# Patient Record
Sex: Female | Born: 1955 | Race: White | Hispanic: No | Smoking: Never smoker
Health system: Southern US, Community
[De-identification: ages and names within clinical notes are randomized; demographics above are authoritative.]

## PROBLEM LIST (undated history)

## (undated) DIAGNOSIS — E7800 Pure hypercholesterolemia, unspecified: Secondary | ICD-10-CM

## (undated) DIAGNOSIS — G629 Polyneuropathy, unspecified: Secondary | ICD-10-CM

## (undated) DIAGNOSIS — M1990 Unspecified osteoarthritis, unspecified site: Secondary | ICD-10-CM

## (undated) DIAGNOSIS — J45909 Unspecified asthma, uncomplicated: Secondary | ICD-10-CM

## (undated) DIAGNOSIS — J309 Allergic rhinitis, unspecified: Secondary | ICD-10-CM

## (undated) HISTORY — PX: NASAL SINUS SURGERY: SHX719

## (undated) HISTORY — DX: Pure hypercholesterolemia, unspecified: E78.00

## (undated) HISTORY — DX: Allergic rhinitis, unspecified: J30.9

## (undated) HISTORY — DX: Unspecified osteoarthritis, unspecified site: M19.90

## (undated) HISTORY — DX: Unspecified asthma, uncomplicated: J45.909

## (undated) HISTORY — DX: Polyneuropathy, unspecified: G62.9

## (undated) HISTORY — PX: OVARIAN CYST REMOVAL: SHX89

---

## 1999-01-27 ENCOUNTER — Other Ambulatory Visit: Admission: RE | Admit: 1999-01-27 | Discharge: 1999-01-27 | Payer: Self-pay | Admitting: Obstetrics and Gynecology

## 2000-01-28 ENCOUNTER — Other Ambulatory Visit: Admission: RE | Admit: 2000-01-28 | Discharge: 2000-01-28 | Payer: Self-pay | Admitting: Obstetrics and Gynecology

## 2000-01-28 ENCOUNTER — Encounter (INDEPENDENT_AMBULATORY_CARE_PROVIDER_SITE_OTHER): Payer: Self-pay

## 2000-06-02 ENCOUNTER — Other Ambulatory Visit: Admission: RE | Admit: 2000-06-02 | Discharge: 2000-06-02 | Payer: Self-pay | Admitting: Obstetrics and Gynecology

## 2001-06-12 ENCOUNTER — Other Ambulatory Visit: Admission: RE | Admit: 2001-06-12 | Discharge: 2001-06-12 | Payer: Self-pay | Admitting: Obstetrics and Gynecology

## 2001-10-12 ENCOUNTER — Encounter: Admission: RE | Admit: 2001-10-12 | Discharge: 2001-10-12 | Payer: Self-pay | Admitting: Obstetrics and Gynecology

## 2001-10-12 ENCOUNTER — Encounter: Payer: Self-pay | Admitting: Obstetrics and Gynecology

## 2002-06-14 ENCOUNTER — Other Ambulatory Visit: Admission: RE | Admit: 2002-06-14 | Discharge: 2002-06-14 | Payer: Self-pay | Admitting: Obstetrics and Gynecology

## 2002-11-01 ENCOUNTER — Encounter: Admission: RE | Admit: 2002-11-01 | Discharge: 2002-11-01 | Payer: Self-pay | Admitting: Obstetrics and Gynecology

## 2002-11-01 ENCOUNTER — Encounter: Payer: Self-pay | Admitting: Obstetrics and Gynecology

## 2003-06-25 ENCOUNTER — Other Ambulatory Visit: Admission: RE | Admit: 2003-06-25 | Discharge: 2003-06-25 | Payer: Self-pay | Admitting: Obstetrics and Gynecology

## 2003-11-14 ENCOUNTER — Encounter: Admission: RE | Admit: 2003-11-14 | Discharge: 2003-11-14 | Payer: Self-pay | Admitting: Obstetrics and Gynecology

## 2004-06-30 ENCOUNTER — Other Ambulatory Visit: Admission: RE | Admit: 2004-06-30 | Discharge: 2004-06-30 | Payer: Self-pay | Admitting: Obstetrics and Gynecology

## 2004-07-13 ENCOUNTER — Ambulatory Visit (HOSPITAL_COMMUNITY): Admission: RE | Admit: 2004-07-13 | Discharge: 2004-07-13 | Payer: Self-pay | Admitting: Gastroenterology

## 2004-10-19 ENCOUNTER — Ambulatory Visit: Payer: Self-pay | Admitting: Internal Medicine

## 2004-12-02 ENCOUNTER — Encounter: Admission: RE | Admit: 2004-12-02 | Discharge: 2004-12-02 | Payer: Self-pay | Admitting: Obstetrics and Gynecology

## 2005-07-26 ENCOUNTER — Other Ambulatory Visit: Admission: RE | Admit: 2005-07-26 | Discharge: 2005-07-26 | Payer: Self-pay | Admitting: Obstetrics and Gynecology

## 2005-12-09 ENCOUNTER — Encounter: Admission: RE | Admit: 2005-12-09 | Discharge: 2005-12-09 | Payer: Self-pay | Admitting: Obstetrics and Gynecology

## 2006-07-27 ENCOUNTER — Other Ambulatory Visit: Admission: RE | Admit: 2006-07-27 | Discharge: 2006-07-27 | Payer: Self-pay | Admitting: Obstetrics and Gynecology

## 2006-12-12 ENCOUNTER — Encounter: Admission: RE | Admit: 2006-12-12 | Discharge: 2006-12-12 | Payer: Self-pay | Admitting: Obstetrics and Gynecology

## 2007-07-31 ENCOUNTER — Other Ambulatory Visit: Admission: RE | Admit: 2007-07-31 | Discharge: 2007-07-31 | Payer: Self-pay | Admitting: Obstetrics and Gynecology

## 2007-12-20 ENCOUNTER — Other Ambulatory Visit: Payer: Self-pay

## 2007-12-20 ENCOUNTER — Observation Stay: Payer: Self-pay | Admitting: Internal Medicine

## 2007-12-28 ENCOUNTER — Encounter: Admission: RE | Admit: 2007-12-28 | Discharge: 2007-12-28 | Payer: Self-pay | Admitting: Obstetrics and Gynecology

## 2008-08-06 ENCOUNTER — Other Ambulatory Visit: Admission: RE | Admit: 2008-08-06 | Discharge: 2008-08-06 | Payer: Self-pay | Admitting: Obstetrics and Gynecology

## 2008-10-09 ENCOUNTER — Ambulatory Visit: Payer: Self-pay | Admitting: Internal Medicine

## 2008-10-29 ENCOUNTER — Ambulatory Visit: Payer: Self-pay | Admitting: Internal Medicine

## 2009-02-06 ENCOUNTER — Encounter: Admission: RE | Admit: 2009-02-06 | Discharge: 2009-02-06 | Payer: Self-pay | Admitting: Obstetrics and Gynecology

## 2009-08-08 ENCOUNTER — Other Ambulatory Visit: Admission: RE | Admit: 2009-08-08 | Discharge: 2009-08-08 | Payer: Self-pay | Admitting: Obstetrics and Gynecology

## 2010-06-11 ENCOUNTER — Encounter: Admission: RE | Admit: 2010-06-11 | Discharge: 2010-06-11 | Payer: Self-pay | Admitting: Obstetrics and Gynecology

## 2011-02-12 NOTE — Op Note (Signed)
NAME:  Kristin Sparks, Kristin Sparks NO.:  [id number]   MEDICAL RECORD NO.:  [id number]          PATIENT TYPE:  AMB   LOCATION:  ENDO                         FACILITY:  [facility]   PHYSICIAN:  Petra Kuba, M.D.    DATE OF BIRTH:  06-18-1956   DATE OF PROCEDURE:  07/13/2004  DATE OF DISCHARGE:                                 OPERATIVE REPORT   PROCEDURE:  Colonoscopy.   ENDOSCOPIST:  Petra Kuba, M.D.   INDICATION:  Family history of colon cancer, due to colonic screening.   INFORMED CONSENT:  Consent was signed after risks, benefits, methods and  options were thoroughly discussed in the office in the past.   MEDICATIONS USED:  Demerol 75 mg, Versed 8 mg.   PROCEDURE:  Rectal inspection was pertinent for external hemorrhoids, small.  Digital exam was negative.  Video pediatric adjustable colonoscope was  inserted and with abdominal pressure, able to be advanced around the colon  to the cecum.  No position changes were needed.  No abnormality was seen on  insertion.  The cecum was identified by the appendiceal orifice and the  ileocecal valve.  Scope was slowly withdrawn.  The prep was adequate.  There  was some liquid stool that required washing and suctioning.  On slow  withdrawal through the colon, no polyps, tumors, masses or diverticula were  seen as we slowly withdrew back to the rectum.  Anorectal pull-through in  retroflexion confirmed some small hemorrhoids.  Scope was straightened and  re-advanced a short ways up the left side of the colon, air was suctioned  and scope removed.  The patient tolerated the procedure well.  There were no  obvious immediate complications.   ENDOSCOPIC DIAGNOSES:  1.  Internal and external small hemorrhoids.  2.  Otherwise within normal limits to the cecum.   PLAN:  Yearly rectals and guaiacs per either Dr. Artist Pais or Dr.  Ailene Ards at the [facility].  Happy to see back p.r.n.,  otherwise, repeat screening in 5  years.       MEM/MEDQ  D:  07/13/2004  T:  07/13/2004  Job:  [id number]   cc:   Artist Pais, M.D.  [address], [address]  [place]  [place] [address]  Fax: [phone]   Ailene Ards  [address]  [place]  [place] [address]  Fax: [phone]

## 2011-06-23 ENCOUNTER — Other Ambulatory Visit: Payer: Self-pay | Admitting: Gynecology

## 2011-06-23 DIAGNOSIS — Z1231 Encounter for screening mammogram for malignant neoplasm of breast: Secondary | ICD-10-CM

## 2011-07-05 ENCOUNTER — Ambulatory Visit
Admission: RE | Admit: 2011-07-05 | Discharge: 2011-07-05 | Disposition: A | Discharge status: Home / Self Care | Payer: 59 | Source: Ambulatory Visit | Attending: Gynecology | Admitting: Gynecology

## 2011-07-05 DIAGNOSIS — Z1231 Encounter for screening mammogram for malignant neoplasm of breast: Secondary | ICD-10-CM

## 2012-06-05 ENCOUNTER — Other Ambulatory Visit: Payer: Self-pay | Admitting: Gynecology

## 2012-06-05 DIAGNOSIS — Z1231 Encounter for screening mammogram for malignant neoplasm of breast: Secondary | ICD-10-CM

## 2012-08-04 ENCOUNTER — Ambulatory Visit
Admission: RE | Admit: 2012-08-04 | Discharge: 2012-08-04 | Disposition: A | Discharge status: Home / Self Care | Payer: 59 | Source: Ambulatory Visit | Attending: Gynecology | Admitting: Gynecology

## 2012-08-04 DIAGNOSIS — Z1231 Encounter for screening mammogram for malignant neoplasm of breast: Secondary | ICD-10-CM

## 2012-08-09 ENCOUNTER — Other Ambulatory Visit: Payer: Self-pay | Admitting: Gynecology

## 2012-08-09 DIAGNOSIS — R928 Other abnormal and inconclusive findings on diagnostic imaging of breast: Secondary | ICD-10-CM

## 2012-08-16 ENCOUNTER — Ambulatory Visit
Admission: RE | Admit: 2012-08-16 | Discharge: 2012-08-16 | Disposition: A | Discharge status: Home / Self Care | Payer: 59 | Source: Ambulatory Visit | Attending: Gynecology | Admitting: Gynecology

## 2012-08-16 DIAGNOSIS — R928 Other abnormal and inconclusive findings on diagnostic imaging of breast: Secondary | ICD-10-CM

## 2013-03-22 ENCOUNTER — Other Ambulatory Visit: Payer: Self-pay

## 2013-03-22 MED ORDER — PREGABALIN 75 MG PO CAPS: 75.0000 mg | ORAL_CAPSULE | Freq: Every evening | ORAL | Status: AC

## 2013-03-22 NOTE — Telephone Encounter (Signed)
Former [facility] patient, has not been assigned new provider.  Forwarding refill request to Dr Vickey Huger, Raquel Sarna

## 2013-07-25 ENCOUNTER — Other Ambulatory Visit: Payer: Self-pay

## 2013-07-25 DIAGNOSIS — Z1231 Encounter for screening mammogram for malignant neoplasm of breast: Secondary | ICD-10-CM

## 2013-10-01 ENCOUNTER — Ambulatory Visit
Admission: RE | Admit: 2013-10-01 | Discharge: 2013-10-01 | Disposition: A | Discharge status: Home / Self Care | Payer: 59 | Source: Ambulatory Visit

## 2013-10-01 DIAGNOSIS — Z1231 Encounter for screening mammogram for malignant neoplasm of breast: Secondary | ICD-10-CM

## 2014-08-19 DIAGNOSIS — J45909 Unspecified asthma, uncomplicated: Secondary | ICD-10-CM | POA: Insufficient documentation

## 2015-06-10 DIAGNOSIS — G608 Other hereditary and idiopathic neuropathies: Secondary | ICD-10-CM | POA: Insufficient documentation

## 2015-11-18 ENCOUNTER — Other Ambulatory Visit: Payer: Self-pay | Admitting: Obstetrics and Gynecology

## 2015-11-18 DIAGNOSIS — R928 Other abnormal and inconclusive findings on diagnostic imaging of breast: Secondary | ICD-10-CM

## 2015-11-25 ENCOUNTER — Ambulatory Visit
Admission: RE | Admit: 2015-11-25 | Discharge: 2015-11-25 | Disposition: A | Discharge status: Home / Self Care | Payer: 59 | Source: Ambulatory Visit | Attending: Obstetrics and Gynecology | Admitting: Obstetrics and Gynecology

## 2015-11-25 DIAGNOSIS — R928 Other abnormal and inconclusive findings on diagnostic imaging of breast: Secondary | ICD-10-CM

## 2015-12-09 DIAGNOSIS — J301 Allergic rhinitis due to pollen: Secondary | ICD-10-CM | POA: Insufficient documentation

## 2015-12-09 DIAGNOSIS — E7800 Pure hypercholesterolemia, unspecified: Secondary | ICD-10-CM | POA: Insufficient documentation

## 2016-07-26 ENCOUNTER — Ambulatory Visit (INDEPENDENT_AMBULATORY_CARE_PROVIDER_SITE_OTHER): Payer: 59 | Admitting: Psychology

## 2016-07-26 DIAGNOSIS — F4323 Adjustment disorder with mixed anxiety and depressed mood: Secondary | ICD-10-CM

## 2016-08-09 ENCOUNTER — Ambulatory Visit (INDEPENDENT_AMBULATORY_CARE_PROVIDER_SITE_OTHER): Payer: 59 | Admitting: Psychology

## 2016-08-09 DIAGNOSIS — F4323 Adjustment disorder with mixed anxiety and depressed mood: Secondary | ICD-10-CM | POA: Diagnosis not present

## 2016-08-26 ENCOUNTER — Ambulatory Visit (INDEPENDENT_AMBULATORY_CARE_PROVIDER_SITE_OTHER): Payer: 59 | Admitting: Psychology

## 2016-08-26 DIAGNOSIS — F4323 Adjustment disorder with mixed anxiety and depressed mood: Secondary | ICD-10-CM

## 2016-09-09 ENCOUNTER — Ambulatory Visit (INDEPENDENT_AMBULATORY_CARE_PROVIDER_SITE_OTHER): Payer: 59 | Admitting: Psychology

## 2016-09-09 DIAGNOSIS — F4323 Adjustment disorder with mixed anxiety and depressed mood: Secondary | ICD-10-CM | POA: Diagnosis not present

## 2016-10-06 DIAGNOSIS — M25562 Pain in left knee: Secondary | ICD-10-CM | POA: Diagnosis not present

## 2016-10-08 DIAGNOSIS — M25562 Pain in left knee: Secondary | ICD-10-CM | POA: Diagnosis not present

## 2016-10-12 DIAGNOSIS — M25562 Pain in left knee: Secondary | ICD-10-CM | POA: Diagnosis not present

## 2016-10-13 ENCOUNTER — Ambulatory Visit: Payer: 59 | Admitting: Psychology

## 2016-10-18 DIAGNOSIS — M25562 Pain in left knee: Secondary | ICD-10-CM | POA: Diagnosis not present

## 2016-10-21 DIAGNOSIS — M25562 Pain in left knee: Secondary | ICD-10-CM | POA: Diagnosis not present

## 2016-10-25 DIAGNOSIS — M25562 Pain in left knee: Secondary | ICD-10-CM | POA: Diagnosis not present

## 2016-10-28 ENCOUNTER — Ambulatory Visit (INDEPENDENT_AMBULATORY_CARE_PROVIDER_SITE_OTHER): Payer: 59 | Admitting: Psychology

## 2016-10-28 DIAGNOSIS — M25562 Pain in left knee: Secondary | ICD-10-CM | POA: Diagnosis not present

## 2016-10-28 DIAGNOSIS — F4323 Adjustment disorder with mixed anxiety and depressed mood: Secondary | ICD-10-CM

## 2016-11-02 DIAGNOSIS — M25562 Pain in left knee: Secondary | ICD-10-CM | POA: Diagnosis not present

## 2016-11-05 DIAGNOSIS — M1712 Unilateral primary osteoarthritis, left knee: Secondary | ICD-10-CM | POA: Diagnosis not present

## 2016-11-05 DIAGNOSIS — M25562 Pain in left knee: Secondary | ICD-10-CM | POA: Diagnosis not present

## 2016-11-12 DIAGNOSIS — M25562 Pain in left knee: Secondary | ICD-10-CM | POA: Diagnosis not present

## 2016-11-15 DIAGNOSIS — Z1231 Encounter for screening mammogram for malignant neoplasm of breast: Secondary | ICD-10-CM | POA: Diagnosis not present

## 2016-11-19 DIAGNOSIS — M25562 Pain in left knee: Secondary | ICD-10-CM | POA: Diagnosis not present

## 2016-11-23 DIAGNOSIS — Z01419 Encounter for gynecological examination (general) (routine) without abnormal findings: Secondary | ICD-10-CM | POA: Diagnosis not present

## 2016-11-23 DIAGNOSIS — Z6824 Body mass index (BMI) 24.0-24.9, adult: Secondary | ICD-10-CM | POA: Diagnosis not present

## 2016-11-26 DIAGNOSIS — M25562 Pain in left knee: Secondary | ICD-10-CM | POA: Diagnosis not present

## 2016-12-03 DIAGNOSIS — M25562 Pain in left knee: Secondary | ICD-10-CM | POA: Diagnosis not present

## 2016-12-10 DIAGNOSIS — M25562 Pain in left knee: Secondary | ICD-10-CM | POA: Diagnosis not present

## 2016-12-16 ENCOUNTER — Ambulatory Visit (INDEPENDENT_AMBULATORY_CARE_PROVIDER_SITE_OTHER): Payer: 59 | Admitting: Psychology

## 2016-12-16 DIAGNOSIS — F4323 Adjustment disorder with mixed anxiety and depressed mood: Secondary | ICD-10-CM | POA: Diagnosis not present

## 2016-12-20 DIAGNOSIS — M25562 Pain in left knee: Secondary | ICD-10-CM | POA: Diagnosis not present

## 2016-12-28 ENCOUNTER — Ambulatory Visit (INDEPENDENT_AMBULATORY_CARE_PROVIDER_SITE_OTHER): Payer: 59 | Admitting: Psychology

## 2016-12-28 DIAGNOSIS — F4323 Adjustment disorder with mixed anxiety and depressed mood: Secondary | ICD-10-CM | POA: Diagnosis not present

## 2017-01-03 DIAGNOSIS — H401131 Primary open-angle glaucoma, bilateral, mild stage: Secondary | ICD-10-CM | POA: Diagnosis not present

## 2017-01-10 ENCOUNTER — Ambulatory Visit (INDEPENDENT_AMBULATORY_CARE_PROVIDER_SITE_OTHER): Payer: 59 | Admitting: Psychology

## 2017-01-10 DIAGNOSIS — F4323 Adjustment disorder with mixed anxiety and depressed mood: Secondary | ICD-10-CM

## 2017-01-27 ENCOUNTER — Ambulatory Visit (INDEPENDENT_AMBULATORY_CARE_PROVIDER_SITE_OTHER): Payer: 59 | Admitting: Psychology

## 2017-01-27 DIAGNOSIS — F4323 Adjustment disorder with mixed anxiety and depressed mood: Secondary | ICD-10-CM

## 2017-02-02 DIAGNOSIS — J4520 Mild intermittent asthma, uncomplicated: Secondary | ICD-10-CM | POA: Diagnosis not present

## 2017-02-02 DIAGNOSIS — J3089 Other allergic rhinitis: Secondary | ICD-10-CM | POA: Diagnosis not present

## 2017-02-10 DIAGNOSIS — J4520 Mild intermittent asthma, uncomplicated: Secondary | ICD-10-CM | POA: Diagnosis not present

## 2017-02-15 ENCOUNTER — Ambulatory Visit (INDEPENDENT_AMBULATORY_CARE_PROVIDER_SITE_OTHER): Payer: 59 | Admitting: Psychology

## 2017-02-15 DIAGNOSIS — F4323 Adjustment disorder with mixed anxiety and depressed mood: Secondary | ICD-10-CM

## 2017-03-01 ENCOUNTER — Ambulatory Visit (INDEPENDENT_AMBULATORY_CARE_PROVIDER_SITE_OTHER): Payer: 59 | Admitting: Psychology

## 2017-03-01 DIAGNOSIS — F321 Major depressive disorder, single episode, moderate: Secondary | ICD-10-CM | POA: Diagnosis not present

## 2017-03-15 ENCOUNTER — Ambulatory Visit (INDEPENDENT_AMBULATORY_CARE_PROVIDER_SITE_OTHER): Payer: 59 | Admitting: Psychology

## 2017-03-15 DIAGNOSIS — F4323 Adjustment disorder with mixed anxiety and depressed mood: Secondary | ICD-10-CM

## 2017-03-21 ENCOUNTER — Ambulatory Visit (INDEPENDENT_AMBULATORY_CARE_PROVIDER_SITE_OTHER): Payer: 59 | Admitting: Psychology

## 2017-03-21 DIAGNOSIS — F4323 Adjustment disorder with mixed anxiety and depressed mood: Secondary | ICD-10-CM

## 2017-05-04 ENCOUNTER — Ambulatory Visit (INDEPENDENT_AMBULATORY_CARE_PROVIDER_SITE_OTHER): Payer: 59 | Admitting: Psychology

## 2017-05-04 DIAGNOSIS — F4323 Adjustment disorder with mixed anxiety and depressed mood: Secondary | ICD-10-CM | POA: Diagnosis not present

## 2017-05-16 ENCOUNTER — Ambulatory Visit (INDEPENDENT_AMBULATORY_CARE_PROVIDER_SITE_OTHER): Payer: 59 | Admitting: Psychology

## 2017-05-16 DIAGNOSIS — F4323 Adjustment disorder with mixed anxiety and depressed mood: Secondary | ICD-10-CM | POA: Diagnosis not present

## 2017-05-26 ENCOUNTER — Ambulatory Visit (INDEPENDENT_AMBULATORY_CARE_PROVIDER_SITE_OTHER): Payer: 59 | Admitting: Psychology

## 2017-05-26 DIAGNOSIS — F4323 Adjustment disorder with mixed anxiety and depressed mood: Secondary | ICD-10-CM

## 2017-06-03 ENCOUNTER — Ambulatory Visit: Payer: 59 | Admitting: Family Medicine

## 2017-06-06 ENCOUNTER — Ambulatory Visit (INDEPENDENT_AMBULATORY_CARE_PROVIDER_SITE_OTHER): Payer: 59 | Admitting: Psychology

## 2017-06-06 DIAGNOSIS — F4323 Adjustment disorder with mixed anxiety and depressed mood: Secondary | ICD-10-CM

## 2017-06-07 ENCOUNTER — Ambulatory Visit (INDEPENDENT_AMBULATORY_CARE_PROVIDER_SITE_OTHER): Payer: 59 | Admitting: Family Medicine

## 2017-06-07 ENCOUNTER — Encounter: Payer: Self-pay | Admitting: Family Medicine

## 2017-06-07 VITALS — BP 122/60 | Ht 64.0 in | Wt 140.0 lb

## 2017-06-07 DIAGNOSIS — M25562 Pain in left knee: Secondary | ICD-10-CM | POA: Diagnosis not present

## 2017-06-07 MED ORDER — METHYLPREDNISOLONE ACETATE 40 MG/ML IJ SUSP
40.0000 mg | Freq: Once | INTRAMUSCULAR | Status: AC
  Administered 2017-06-07: 40 mg via INTRA_ARTICULAR

## 2017-06-07 NOTE — Patient Instructions (Signed)
Today you received an injection with corticosteroid. This injection is usually done in response to pain and inflammation. There is some "numbing" medicine also in the shot so the injected area may be numb and feel really good for the next couple of hours. The numbing medicine usually wears off in 2-3 hours though, and then your pain level will be right back where it was before the injection.   The actually benefit from the steroid injection is usually noticed in 2-7 days. You may actually experience a small (as in 10%) INCREASE in pain in the first 24 hours---that is common.   Things to watch out for that you should contact us or a health care provider urgently would include: 1. Unusual (as in more than 10%) increase in pain 2. New fever > 101.5 3. New swelling or redness of the injected area.  4. Streaking of red lines around the area injected.  

## 2017-06-08 ENCOUNTER — Encounter: Payer: Self-pay | Admitting: Family Medicine

## 2017-06-08 DIAGNOSIS — M25562 Pain in left knee: Secondary | ICD-10-CM | POA: Insufficient documentation

## 2017-06-08 DIAGNOSIS — L719 Rosacea, unspecified: Secondary | ICD-10-CM | POA: Insufficient documentation

## 2017-06-08 NOTE — Progress Notes (Signed)
    CHIEF COMPLAINT / HPI:  Left knee pain for several months. Avid runner, 3 times a week---about 10 miles per week. Likes to run [employer] and [employer] races. Started having problems about 6 months ago, initially only after running. Progressed and was with running and after. Stopped running for 3 months and then re-tried it and pain was still there. During off time she did have some pain with activity but none with rest. Pain worse with twisting motions.  PERTINENT  PMH / PSH: I have reviewed the patient's medications, allergies, past medical and surgical history, smoking status.  Pertinent findings that relate to today's visit / issues include: Asthma  No peronsal hx of DM Never smoker FH negative for issues pertinent to chief complaint  REVIEW OF SYSTEMS:  CONSTITUTIONAL: no unusual weight change, no fever. No unusual fatigue. SKIN: no rash HEME: no unusual bleeding or bruising MSK: no other unusual arthralgias or myalgias NEURO: baseline neuropathy that is unchanged recently PSYCHIATRIC: no unusual mood change or depressive symptoms.  OBJECTIVE:  Vital signs are reviewed.  GEN WD WN NAD KNEES: symmetrical but slight effusion on left. FROM bilaterally. Ligamentously intact bilaterally with normal Lachman and good dsable endpoint.. Very mild crepitus Bilaterally but L>R. Nonpainful.  Patella with normal tracking. LEFT knee ttp over a specific dime sized area anterior medial. McMurray causes pain in same area but no pop. PosiitiveThesally. Painful patellar grind.. calves are non tender B and muscle bulk is symmetrical. Quad development is good. GAIT: running: short stride length which leads to a lot of "pounding" during stance phase. Forefoot striiker. upright body posture. Symmetrical. Subtalar motion seems normal. NEURO: grossly intact sensation to soft touch bilateral LE including foot and ankle with intact proprioception. VASC: DP pulses 2+B= PSYCH AXOX4. Normal affect.  ASSESSMENT /  PLAN: Please see problem oriented charting for details

## 2017-06-08 NOTE — Assessment & Plan Note (Signed)
We discussed options at length. MRI would give more definitive diagnosis but initial tratament would likely be the same. She has essentially already tried adequate conservative approach althouhgh she did not use 2 weeks of NSAID: I doubt it would be beneficial at this point. We decided to try CSI and see how she does. If no improvement, then I would re-evaluate with consideration of MRI.  INJECTION: Patient was given informed consent, signed copy in the chart. Appropriate time out was taken. Area prepped and draped in usual sterile fashion. 1 cc of methylprednisolone 40 mg/ml plus  4 cc of 1% lidocaine without epinephrine was injected into the LEFT KNEE JOINT using a(n)  Anterior medial approach. Needle 21 gauge 11/2 inch. Ethyl chloride used for anesthesia as well.The patient tolerated the procedure well. There were no complications. Post procedure instructions were given. See AVS for further instructions.

## 2017-06-23 DIAGNOSIS — H401131 Primary open-angle glaucoma, bilateral, mild stage: Secondary | ICD-10-CM | POA: Diagnosis not present

## 2017-07-07 ENCOUNTER — Ambulatory Visit (INDEPENDENT_AMBULATORY_CARE_PROVIDER_SITE_OTHER): Payer: 59 | Admitting: Psychology

## 2017-07-07 DIAGNOSIS — F321 Major depressive disorder, single episode, moderate: Secondary | ICD-10-CM

## 2017-07-07 DIAGNOSIS — F4323 Adjustment disorder with mixed anxiety and depressed mood: Secondary | ICD-10-CM

## 2017-07-21 ENCOUNTER — Ambulatory Visit (INDEPENDENT_AMBULATORY_CARE_PROVIDER_SITE_OTHER): Payer: 59 | Admitting: Psychology

## 2017-07-21 DIAGNOSIS — F321 Major depressive disorder, single episode, moderate: Secondary | ICD-10-CM

## 2017-07-21 DIAGNOSIS — F4323 Adjustment disorder with mixed anxiety and depressed mood: Secondary | ICD-10-CM | POA: Diagnosis not present

## 2017-07-25 DIAGNOSIS — Z23 Encounter for immunization: Secondary | ICD-10-CM | POA: Diagnosis not present

## 2017-08-04 ENCOUNTER — Ambulatory Visit: Payer: 59 | Admitting: Psychology

## 2017-08-04 DIAGNOSIS — F321 Major depressive disorder, single episode, moderate: Secondary | ICD-10-CM

## 2017-08-04 DIAGNOSIS — F4323 Adjustment disorder with mixed anxiety and depressed mood: Secondary | ICD-10-CM | POA: Diagnosis not present

## 2017-08-09 DIAGNOSIS — H10522 Angular blepharoconjunctivitis, left eye: Secondary | ICD-10-CM | POA: Diagnosis not present

## 2017-08-16 ENCOUNTER — Ambulatory Visit: Payer: 59 | Admitting: Psychology

## 2017-08-16 DIAGNOSIS — F321 Major depressive disorder, single episode, moderate: Secondary | ICD-10-CM

## 2017-08-16 DIAGNOSIS — F4323 Adjustment disorder with mixed anxiety and depressed mood: Secondary | ICD-10-CM

## 2017-08-17 DIAGNOSIS — D485 Neoplasm of uncertain behavior of skin: Secondary | ICD-10-CM | POA: Diagnosis not present

## 2017-08-17 DIAGNOSIS — D0439 Carcinoma in situ of skin of other parts of face: Secondary | ICD-10-CM | POA: Diagnosis not present

## 2017-08-17 DIAGNOSIS — D225 Melanocytic nevi of trunk: Secondary | ICD-10-CM | POA: Diagnosis not present

## 2017-09-06 ENCOUNTER — Ambulatory Visit: Payer: 59 | Admitting: Psychology

## 2017-09-22 ENCOUNTER — Ambulatory Visit: Payer: 59 | Admitting: Psychology

## 2017-10-06 ENCOUNTER — Ambulatory Visit: Payer: 59 | Admitting: Psychology

## 2017-10-06 DIAGNOSIS — F321 Major depressive disorder, single episode, moderate: Secondary | ICD-10-CM

## 2017-10-06 DIAGNOSIS — F4323 Adjustment disorder with mixed anxiety and depressed mood: Secondary | ICD-10-CM

## 2017-10-17 DIAGNOSIS — J4520 Mild intermittent asthma, uncomplicated: Secondary | ICD-10-CM | POA: Diagnosis not present

## 2017-10-19 ENCOUNTER — Ambulatory Visit: Payer: 59 | Admitting: Psychology

## 2017-10-24 DIAGNOSIS — J4520 Mild intermittent asthma, uncomplicated: Secondary | ICD-10-CM | POA: Diagnosis not present

## 2017-10-24 DIAGNOSIS — Z0000 Encounter for general adult medical examination without abnormal findings: Secondary | ICD-10-CM | POA: Diagnosis not present

## 2017-11-01 ENCOUNTER — Ambulatory Visit: Payer: 59 | Admitting: Psychology

## 2017-11-01 DIAGNOSIS — F321 Major depressive disorder, single episode, moderate: Secondary | ICD-10-CM | POA: Diagnosis not present

## 2017-11-01 DIAGNOSIS — F4323 Adjustment disorder with mixed anxiety and depressed mood: Secondary | ICD-10-CM

## 2017-11-15 ENCOUNTER — Ambulatory Visit: Payer: 59 | Admitting: Psychology

## 2017-11-15 DIAGNOSIS — F4323 Adjustment disorder with mixed anxiety and depressed mood: Secondary | ICD-10-CM | POA: Diagnosis not present

## 2017-11-15 DIAGNOSIS — F321 Major depressive disorder, single episode, moderate: Secondary | ICD-10-CM

## 2017-11-23 DIAGNOSIS — H401131 Primary open-angle glaucoma, bilateral, mild stage: Secondary | ICD-10-CM | POA: Diagnosis not present

## 2017-11-29 ENCOUNTER — Ambulatory Visit: Payer: 59 | Admitting: Psychology

## 2017-11-29 DIAGNOSIS — F321 Major depressive disorder, single episode, moderate: Secondary | ICD-10-CM | POA: Diagnosis not present

## 2017-11-29 DIAGNOSIS — F4323 Adjustment disorder with mixed anxiety and depressed mood: Secondary | ICD-10-CM | POA: Diagnosis not present

## 2017-12-13 ENCOUNTER — Ambulatory Visit: Payer: 59 | Admitting: Psychology

## 2017-12-13 DIAGNOSIS — Z6824 Body mass index (BMI) 24.0-24.9, adult: Secondary | ICD-10-CM | POA: Diagnosis not present

## 2017-12-13 DIAGNOSIS — Z01419 Encounter for gynecological examination (general) (routine) without abnormal findings: Secondary | ICD-10-CM | POA: Diagnosis not present

## 2017-12-20 DIAGNOSIS — Z85828 Personal history of other malignant neoplasm of skin: Secondary | ICD-10-CM | POA: Diagnosis not present

## 2017-12-27 ENCOUNTER — Ambulatory Visit: Payer: 59 | Admitting: Psychology

## 2017-12-27 DIAGNOSIS — F321 Major depressive disorder, single episode, moderate: Secondary | ICD-10-CM

## 2017-12-27 DIAGNOSIS — F4323 Adjustment disorder with mixed anxiety and depressed mood: Secondary | ICD-10-CM

## 2018-01-10 ENCOUNTER — Ambulatory Visit: Payer: 59 | Admitting: Psychology

## 2018-01-24 ENCOUNTER — Ambulatory Visit: Payer: 59 | Admitting: Psychology

## 2018-01-24 DIAGNOSIS — F4323 Adjustment disorder with mixed anxiety and depressed mood: Secondary | ICD-10-CM

## 2018-01-24 DIAGNOSIS — F321 Major depressive disorder, single episode, moderate: Secondary | ICD-10-CM | POA: Diagnosis not present

## 2018-02-07 ENCOUNTER — Ambulatory Visit: Payer: 59 | Admitting: Psychology

## 2018-02-15 DIAGNOSIS — H40013 Open angle with borderline findings, low risk, bilateral: Secondary | ICD-10-CM | POA: Diagnosis not present

## 2018-02-21 ENCOUNTER — Ambulatory Visit: Payer: 59 | Admitting: Psychology

## 2018-02-21 DIAGNOSIS — F4323 Adjustment disorder with mixed anxiety and depressed mood: Secondary | ICD-10-CM

## 2018-02-21 DIAGNOSIS — F321 Major depressive disorder, single episode, moderate: Secondary | ICD-10-CM

## 2018-03-07 ENCOUNTER — Ambulatory Visit: Payer: 59 | Admitting: Psychology

## 2018-03-07 DIAGNOSIS — F4323 Adjustment disorder with mixed anxiety and depressed mood: Secondary | ICD-10-CM | POA: Diagnosis not present

## 2018-03-07 DIAGNOSIS — F321 Major depressive disorder, single episode, moderate: Secondary | ICD-10-CM | POA: Diagnosis not present

## 2018-03-21 ENCOUNTER — Ambulatory Visit: Payer: 59 | Admitting: Psychology

## 2018-03-21 DIAGNOSIS — F4323 Adjustment disorder with mixed anxiety and depressed mood: Secondary | ICD-10-CM | POA: Diagnosis not present

## 2018-03-21 DIAGNOSIS — F321 Major depressive disorder, single episode, moderate: Secondary | ICD-10-CM

## 2018-04-04 ENCOUNTER — Ambulatory Visit: Payer: 59 | Admitting: Psychology

## 2018-04-04 DIAGNOSIS — F4323 Adjustment disorder with mixed anxiety and depressed mood: Secondary | ICD-10-CM

## 2018-04-04 DIAGNOSIS — F321 Major depressive disorder, single episode, moderate: Secondary | ICD-10-CM | POA: Diagnosis not present

## 2018-04-18 ENCOUNTER — Ambulatory Visit: Payer: 59 | Admitting: Psychology

## 2018-04-18 DIAGNOSIS — F321 Major depressive disorder, single episode, moderate: Secondary | ICD-10-CM

## 2018-04-18 DIAGNOSIS — F4323 Adjustment disorder with mixed anxiety and depressed mood: Secondary | ICD-10-CM

## 2018-04-29 DIAGNOSIS — R197 Diarrhea, unspecified: Secondary | ICD-10-CM | POA: Diagnosis not present

## 2018-04-30 DIAGNOSIS — R197 Diarrhea, unspecified: Secondary | ICD-10-CM | POA: Diagnosis not present

## 2018-05-10 DIAGNOSIS — Z131 Encounter for screening for diabetes mellitus: Secondary | ICD-10-CM | POA: Diagnosis not present

## 2018-05-10 DIAGNOSIS — E7800 Pure hypercholesterolemia, unspecified: Secondary | ICD-10-CM | POA: Diagnosis not present

## 2018-05-16 DIAGNOSIS — H40013 Open angle with borderline findings, low risk, bilateral: Secondary | ICD-10-CM | POA: Diagnosis not present

## 2018-05-16 DIAGNOSIS — H2510 Age-related nuclear cataract, unspecified eye: Secondary | ICD-10-CM | POA: Diagnosis not present

## 2018-05-17 DIAGNOSIS — Z636 Dependent relative needing care at home: Secondary | ICD-10-CM | POA: Diagnosis not present

## 2018-05-17 DIAGNOSIS — J4520 Mild intermittent asthma, uncomplicated: Secondary | ICD-10-CM | POA: Diagnosis not present

## 2018-05-23 ENCOUNTER — Ambulatory Visit: Payer: 59 | Admitting: Psychology

## 2018-05-23 DIAGNOSIS — F321 Major depressive disorder, single episode, moderate: Secondary | ICD-10-CM | POA: Diagnosis not present

## 2018-05-23 DIAGNOSIS — F4323 Adjustment disorder with mixed anxiety and depressed mood: Secondary | ICD-10-CM

## 2018-05-30 ENCOUNTER — Ambulatory Visit: Payer: 59 | Admitting: Psychology

## 2018-05-30 DIAGNOSIS — F4323 Adjustment disorder with mixed anxiety and depressed mood: Secondary | ICD-10-CM | POA: Diagnosis not present

## 2018-05-30 DIAGNOSIS — F321 Major depressive disorder, single episode, moderate: Secondary | ICD-10-CM | POA: Diagnosis not present

## 2018-06-13 ENCOUNTER — Ambulatory Visit: Payer: 59 | Admitting: Psychology

## 2018-06-13 DIAGNOSIS — F4323 Adjustment disorder with mixed anxiety and depressed mood: Secondary | ICD-10-CM | POA: Diagnosis not present

## 2018-06-13 DIAGNOSIS — F321 Major depressive disorder, single episode, moderate: Secondary | ICD-10-CM | POA: Diagnosis not present

## 2018-06-27 ENCOUNTER — Ambulatory Visit: Payer: 59 | Admitting: Psychology

## 2018-06-27 DIAGNOSIS — F321 Major depressive disorder, single episode, moderate: Secondary | ICD-10-CM | POA: Diagnosis not present

## 2018-06-27 DIAGNOSIS — F4323 Adjustment disorder with mixed anxiety and depressed mood: Secondary | ICD-10-CM | POA: Diagnosis not present

## 2018-06-29 ENCOUNTER — Telehealth: Payer: Self-pay | Admitting: Psychiatry

## 2018-06-29 MED ORDER — DULOXETINE HCL 20 MG PO CPEP: 40.0000 mg | ORAL_CAPSULE | Freq: Every day | ORAL | 1 refills | Status: DC

## 2018-06-29 NOTE — Telephone Encounter (Signed)
Will increase Cymbalta to 40 mg po q am. Please let her know I sent a script to her pharmacy for her to take two 20 mg capsules po q am and to call us if she has any issues with cost or insurance coverage. Thanks.

## 2018-06-29 NOTE — Telephone Encounter (Signed)
Can patient increase he r Cymbalta from 30mg  to 40mg ??

## 2018-06-29 NOTE — Telephone Encounter (Signed)
Spoke with pt and she said you all discussed increasing her dose of cymbalta. She doesn't have an appt soon so was wondering if you could do over the phone. Feels she needs the slight increase.   Chart in box    Just let me know Thank you

## 2018-06-30 DIAGNOSIS — Z23 Encounter for immunization: Secondary | ICD-10-CM | POA: Diagnosis not present

## 2018-06-30 NOTE — Telephone Encounter (Signed)
See other note

## 2018-06-30 NOTE — Telephone Encounter (Signed)
Pt given information from provider and verbalized understanding.

## 2018-07-11 ENCOUNTER — Ambulatory Visit: Payer: 59 | Admitting: Psychology

## 2018-07-11 DIAGNOSIS — F321 Major depressive disorder, single episode, moderate: Secondary | ICD-10-CM

## 2018-07-11 DIAGNOSIS — F4323 Adjustment disorder with mixed anxiety and depressed mood: Secondary | ICD-10-CM

## 2018-07-12 ENCOUNTER — Encounter: Payer: Self-pay | Admitting: Emergency Medicine

## 2018-07-12 DIAGNOSIS — F411 Generalized anxiety disorder: Secondary | ICD-10-CM

## 2018-07-12 DIAGNOSIS — F332 Major depressive disorder, recurrent severe without psychotic features: Secondary | ICD-10-CM

## 2018-07-18 ENCOUNTER — Encounter: Payer: Self-pay | Admitting: Psychiatry

## 2018-07-18 ENCOUNTER — Ambulatory Visit (INDEPENDENT_AMBULATORY_CARE_PROVIDER_SITE_OTHER): Payer: 59 | Admitting: Psychiatry

## 2018-07-18 VITALS — BP 106/62 | HR 53

## 2018-07-18 DIAGNOSIS — F411 Generalized anxiety disorder: Secondary | ICD-10-CM

## 2018-07-18 DIAGNOSIS — F331 Major depressive disorder, recurrent, moderate: Secondary | ICD-10-CM | POA: Diagnosis not present

## 2018-07-18 MED ORDER — DULOXETINE HCL 60 MG PO CPEP: 60.0000 mg | ORAL_CAPSULE | Freq: Every day | ORAL | 1 refills | Status: DC

## 2018-07-18 NOTE — Progress Notes (Signed)
[name] [id number] 11/14/1955 62 y.o.  Subjective:   Patient ID:  Kristin Sparks is a 62 y.o. (DOB 20-Jan-1956) female.  Chief Complaint:  Chief Complaint  Patient presents with  . Anxiety  . Depression    HPI Kristin Sparks presents to the office today for follow-up of depression and anxiety. She reports that she has been tolerating Cymbalta 40 mg daily after noticing she was a little sleepy the first few days after the increase. She reports that she continues to notice some episodes of anxiety and irritability daily. She reports that it is typically in response to stressors and is brief in duration. Has noticed more anxiety and worry about her son. For example, son recently took a road trip and she felt as if she did not want him to go and anxious about losing him as well. Reports that she has had some dizziness and feeling light headed with anxiety at times. No recent panic attacks. Reports that sometimes she feels like she is rushing and cannot move fast enough. She reports some improvement in sad mood. She reports energy and motivation are adequate. Denies SI.  She reports that she continues to work through feelings of guilt and grief around loss of marriage that she once had. Reports that therapist is encouraging her to take breaks and is avoiding thinking about certain issues. Pt reports that she tends to stay busy to avoid thinking and feeling sad. She has been working on trying to improve self care. She reports adequate sleep and improved sleep/wake cycle. She reports some slight decrease in appetite but continues to eat an adequate amount. Has noticed that she is eating slower and enjoying her food more. No change in concentration. Reports some increase in enjoyment and interest in things. She reports that she typically has increased depression this time of year and noticed that rainy day today "didn't affect me as much." Reports more positive thought content.   Past medication  trials: Sertraline-diarrhea.  Felt calmer. Lexapro-prescribed for hot flashes.  Had increased anxiety while taking. Effexor XR-some benefit and then was less effective.  Signs and symptoms inadequately controlled at 75 mg and unable to tolerate higher doses. Cymbalta Propanolol Ativan Hydroxyzine  Medications: I have reviewed the patient's current medications.  Current Outpatient Medications  Medication Sig Dispense Refill  . albuterol (PROVENTIL HFA;VENTOLIN HFA) 108 (90 Base) MCG/ACT inhaler Inhale into the lungs.    . calcium-vitamin D (SM CALCIUM 500/VITAMIN D3) 500-400 MG-UNIT tablet Take by mouth.    . Fluticasone-Salmeterol (ADVAIR DISKUS) 250-50 MCG/DOSE AEPB Inhale into the lungs.    . hydrOXYzine (ATARAX/VISTARIL) 10 MG tablet Take 10 mg by mouth at bedtime as needed.    Marland Kitchen levocetirizine (XYZAL) 5 MG tablet TAKE 1 TABLET BY MOUTH EVERY DAY    . lovastatin (MEVACOR) 20 MG tablet Take 20 mg by mouth once a week.     . Omega-3 1000 MG CAPS Take by mouth.    . pregabalin (LYRICA) 75 MG capsule Take 1 capsule (75 mg total) by mouth every evening. (Patient taking differently: Take 75 mg by mouth daily as needed. ) 90 capsule 1  . DULoxetine (CYMBALTA) 60 MG capsule Take 1 capsule (60 mg total) by mouth daily. 30 capsule 1   No current facility-administered medications for this visit.     Medication Side Effects: None  Allergies:  Allergies  Allergen Reactions  . Latex   . Sulfa Antibiotics Rash    No past medical history  on file.  No family history on file.  Social History   Socioeconomic History  . Marital status: Married    Spouse name: Not on file  . Number of children: Not on file  . Years of education: Not on file  . Highest education level: Not on file  Occupational History  . Not on file  Social Needs  . Financial resource strain: Not on file  . Food insecurity:    Worry: Not on file    Inability: Not on file  . Transportation needs:    Medical: Not  on file    Non-medical: Not on file  Tobacco Use  . Smoking status: Never Smoker  . Smokeless tobacco: Never Used  Substance and Sexual Activity  . Alcohol use: Not on file  . Drug use: Not on file  . Sexual activity: Not on file  Lifestyle  . Physical activity:    Days per week: Not on file    Minutes per session: Not on file  . Stress: Not on file  Relationships  . Social connections:    Talks on phone: Not on file    Gets together: Not on file    Attends religious service: Not on file    Active member of club or organization: Not on file    Attends meetings of clubs or organizations: Not on file    Relationship status: Not on file  . Intimate partner violence:    Fear of current or ex partner: Not on file    Emotionally abused: Not on file    Physically abused: Not on file    Forced sexual activity: Not on file  Other Topics Concern  . Not on file  Social History Narrative  . Not on file    Past Medical History, Surgical history, Social history, and Family history were reviewed and updated as appropriate.   Please see review of systems for further details on the patient's review from today.   Review of Systems:  Review of Systems  Musculoskeletal: Negative for gait problem.  Allergic/Immunologic: Positive for environmental allergies.  Neurological: Negative for tremors.    Objective:   Physical Exam:  BP 106/62   Pulse (!) 53   Physical Exam  Constitutional: She is oriented to person, place, and time. She appears well-developed. No distress.  Musculoskeletal: Normal range of motion.  Neurological: She is alert and oriented to person, place, and time. Coordination normal.  Psychiatric: Her speech is normal and behavior is normal. Judgment and thought content normal. Her mood appears anxious. Her affect is not angry, not blunt, not labile and not inappropriate. Cognition and memory are normal. She does not exhibit a depressed mood. She expresses no homicidal and  no suicidal ideation. She expresses no suicidal plans and no homicidal plans.  Presents as less depressed compared to past exams.    Lab Review:  No results found for: NA, K, CL, CO2, GLUCOSE, BUN, CREATININE, CALCIUM, PROT, ALBUMIN, AST, ALT, ALKPHOS, BILITOT, GFRNONAA, GFRAA  No results found for: WBC, RBC, HGB, HCT, PLT, MCV, MCH, MCHC, RDW, LYMPHSABS, MONOABS, EOSABS, BASOSABS  No results found for: POCLITH, LITHIUM   No results found for: PHENYTOIN, PHENOBARB, VALPROATE, CBMZ   .res Assessment: Plan:   Patient seen for 30 minutes and greater than 50% of visit spent counseling patient regarding potential benefits, risks, and side effects of increasing Cymbalta to 60 mg daily.  Discussed that higher dose may be helpful for her mood and anxiety since she  reports partial improvement with both of these signs and symptoms at lower doses.  Discussed that there are other augmentation strategies to consider, such as augmentation with BuSpar, if mood is improved with Cymbalta and anxiety is only partially controlled.  Recommend continuing therapy with Bambi caudle, LCSW.  Patient to follow-up with this provider in 4 to 6 weeks or sooner if clinically indicated.  Patient advised to contact office if she experiences any intolerable side effects. Generalized anxiety disorder - Plan: DULoxetine (CYMBALTA) 60 MG capsule  Major depressive disorder, recurrent episode, moderate (H[facility]) - Plan: DULoxetine (CYMBALTA) 60 MG capsule  Please see After Visit Summary for patient specific instructions.  Future Appointments  Date Time Provider [place]  07/25/2018  9:00 AM Cottle, Lucious Groves, LCSW [facility] None  08/08/2018  9:00 AM Cottle, Bambi G, LCSW [facility] None  08/22/2018  9:30 AM Thayer Headings, PMHNP [facility] None    No orders of the defined types were placed in this encounter.     -------------------------------

## 2018-07-25 ENCOUNTER — Ambulatory Visit: Payer: 59 | Admitting: Psychology

## 2018-07-25 DIAGNOSIS — F4323 Adjustment disorder with mixed anxiety and depressed mood: Secondary | ICD-10-CM

## 2018-07-25 DIAGNOSIS — F321 Major depressive disorder, single episode, moderate: Secondary | ICD-10-CM | POA: Diagnosis not present

## 2018-08-08 ENCOUNTER — Ambulatory Visit: Payer: 59 | Admitting: Psychology

## 2018-08-08 DIAGNOSIS — F321 Major depressive disorder, single episode, moderate: Secondary | ICD-10-CM

## 2018-08-08 DIAGNOSIS — F4321 Adjustment disorder with depressed mood: Secondary | ICD-10-CM | POA: Diagnosis not present

## 2018-08-09 DIAGNOSIS — H40053 Ocular hypertension, bilateral: Secondary | ICD-10-CM | POA: Diagnosis not present

## 2018-08-09 DIAGNOSIS — H40013 Open angle with borderline findings, low risk, bilateral: Secondary | ICD-10-CM | POA: Diagnosis not present

## 2018-08-22 ENCOUNTER — Ambulatory Visit: Payer: 59 | Admitting: Psychology

## 2018-08-22 ENCOUNTER — Ambulatory Visit: Payer: 59 | Admitting: Psychiatry

## 2018-08-22 DIAGNOSIS — F4323 Adjustment disorder with mixed anxiety and depressed mood: Secondary | ICD-10-CM

## 2018-08-22 DIAGNOSIS — F321 Major depressive disorder, single episode, moderate: Secondary | ICD-10-CM

## 2018-09-05 ENCOUNTER — Ambulatory Visit: Payer: 59 | Admitting: Psychology

## 2018-09-05 DIAGNOSIS — F4323 Adjustment disorder with mixed anxiety and depressed mood: Secondary | ICD-10-CM | POA: Diagnosis not present

## 2018-09-05 DIAGNOSIS — F321 Major depressive disorder, single episode, moderate: Secondary | ICD-10-CM | POA: Diagnosis not present

## 2018-09-06 ENCOUNTER — Encounter: Payer: Self-pay | Admitting: Psychiatry

## 2018-09-06 ENCOUNTER — Ambulatory Visit (INDEPENDENT_AMBULATORY_CARE_PROVIDER_SITE_OTHER): Payer: 59 | Admitting: Psychiatry

## 2018-09-06 DIAGNOSIS — F411 Generalized anxiety disorder: Secondary | ICD-10-CM

## 2018-09-06 DIAGNOSIS — F331 Major depressive disorder, recurrent, moderate: Secondary | ICD-10-CM

## 2018-09-06 MED ORDER — DULOXETINE HCL 60 MG PO CPEP: 60.0000 mg | ORAL_CAPSULE | Freq: Every day | ORAL | 1 refills | Status: DC

## 2018-09-06 MED ORDER — BUSPIRONE HCL 15 MG PO TABS: ORAL_TABLET | ORAL | 1 refills | Status: DC

## 2018-09-06 NOTE — Progress Notes (Signed)
Kristin Sparks [id number] 1956/04/26 62 y.o.  Subjective:   Patient ID:  Kristin Sparks is a 62 y.o. (DOB 11-21-1955) female.  Chief Complaint:  Chief Complaint  Patient presents with  . Anxiety  . Follow-up    Depression    HPI Kristin Sparks presents to the office today for follow-up of anxiety and depression. Notices she feels a "little more nervous." She notices that her arm will briefly tighten and has experienced this in the past. Has also noticed some fatigue and that she and her therapist discussed that fatigue is likely related to pt doing a lot with home improvement projects, caregiver for husband, and puppy sitting for 3 weeks. . Reports that she feels less depressed on Cymbalta 60 mg qd but "is a little more nervous." Reports that she frequently rushes around. Reports that she is noticing less worry. She reports that sleep has improved with occasional difficulty falling asleep. Reports that she has rarely needed hydroxyzine prn. Motivation is good and she has been doing different projects that she had been putting off. Reports that she may be tired due to doing "too much." Appetite has been ok and denies change in food intake. Denies irritability. Reports concentration is "a little off" and notices she is going from one task to the next. She reports that her outlook is better. Denies SI.   Notices she is more sensitive to coffee since increase in Cymbalta.   Past medication trials: Sertraline-diarrhea.  Felt calmer. Lexapro-prescribed for hot flashes.  Had increased anxiety while taking. Effexor XR-some benefit and then was less effective.  Signs and symptoms inadequately controlled at 75 mg and unable to tolerate higher doses. Cymbalta Propanolol Ativan Hydroxyzine   Review of Systems:  Review of Systems  Eyes:       Occasional eye twitch.   Gastrointestinal: Positive for abdominal pain.  Musculoskeletal: Negative for gait problem.  Neurological: Negative for  tremors.       Reports that Cymbalta seems to have decreased neuropathy.  Psychiatric/Behavioral:       Please refer to HPI    Medications: I have reviewed the patient's current medications.  Current Outpatient Medications  Medication Sig Dispense Refill  . albuterol (PROVENTIL HFA;VENTOLIN HFA) 108 (90 Base) MCG/ACT inhaler Inhale into the lungs.    . Fluticasone-Salmeterol (ADVAIR DISKUS) 250-50 MCG/DOSE AEPB Inhale into the lungs.    Marland Kitchen levocetirizine (XYZAL) 5 MG tablet TAKE 1 TABLET BY MOUTH EVERY DAY    . lovastatin (MEVACOR) 20 MG tablet Take 20 mg by mouth once a week.     . Omega-3 1000 MG CAPS Take by mouth.    . busPIRone (BUSPAR) 15 MG tablet Take 1/3 tablet p.o. twice daily for 1 week, then take 2/3 tablet p.o. twice daily for 1 week, then take 1 tablet p.o. twice daily 60 tablet 1  . calcium-vitamin D (SM CALCIUM 500/VITAMIN D3) 500-400 MG-UNIT tablet Take by mouth.    . DULoxetine (CYMBALTA) 60 MG capsule Take 1 capsule (60 mg total) by mouth daily. 90 capsule 1  . hydrOXYzine (ATARAX/VISTARIL) 10 MG tablet Take 10 mg by mouth at bedtime as needed.    . pregabalin (LYRICA) 75 MG capsule Take 1 capsule (75 mg total) by mouth every evening. (Patient not taking: Reported on 09/06/2018) 90 capsule 1   No current facility-administered medications for this visit.     Medication Side Effects: Other: Occasional eye twitch and occasional arm twitch.   Allergies:  Allergies  Allergen  Reactions  . Latex   . Sulfa Antibiotics Rash    History reviewed. No pertinent past medical history.  History reviewed. No pertinent family history.  Social History   Socioeconomic History  . Marital status: Married    Spouse name: Not on file  . Number of children: Not on file  . Years of education: Not on file  . Highest education level: Not on file  Occupational History  . Not on file  Social Needs  . Financial resource strain: Not on file  . Food insecurity:    Worry: Not on  file    Inability: Not on file  . Transportation needs:    Medical: Not on file    Non-medical: Not on file  Tobacco Use  . Smoking status: Never Smoker  . Smokeless tobacco: Never Used  Substance and Sexual Activity  . Alcohol use: Not on file  . Drug use: Not on file  . Sexual activity: Not on file  Lifestyle  . Physical activity:    Days per week: Not on file    Minutes per session: Not on file  . Stress: Not on file  Relationships  . Social connections:    Talks on phone: Not on file    Gets together: Not on file    Attends religious service: Not on file    Active member of club or organization: Not on file    Attends meetings of clubs or organizations: Not on file    Relationship status: Not on file  . Intimate partner violence:    Fear of current or ex partner: Not on file    Emotionally abused: Not on file    Physically abused: Not on file    Forced sexual activity: Not on file  Other Topics Concern  . Not on file  Social History Narrative  . Not on file    Past Medical History, Surgical history, Social history, and Family history were reviewed and updated as appropriate.   Please see review of systems for further details on the patient's review from today.   Objective:   Physical Exam:  BP 114/62   Pulse (!) 53   Physical Exam Constitutional:      General: She is not in acute distress.    Appearance: She is well-developed.  Musculoskeletal:        General: No deformity.  Neurological:     Mental Status: She is alert and oriented to person, place, and time.     Coordination: Coordination normal.  Psychiatric:        Mood and Affect: Mood is anxious. Mood is not depressed. Affect is not labile, blunt, angry or inappropriate.        Speech: Speech normal.        Behavior: Behavior normal.        Thought Content: Thought content normal. Thought content does not include homicidal or suicidal ideation. Thought content does not include homicidal or suicidal  plan.        Judgment: Judgment normal.     Comments: Insight intact. No auditory or visual hallucinations. No delusions.  Restless     Lab Review:  No results found for: NA, K, CL, CO2, GLUCOSE, BUN, CREATININE, CALCIUM, PROT, ALBUMIN, AST, ALT, ALKPHOS, BILITOT, GFRNONAA, GFRAA  No results found for: WBC, RBC, HGB, HCT, PLT, MCV, MCH, MCHC, RDW, LYMPHSABS, MONOABS, EOSABS, BASOSABS  No results found for: POCLITH, LITHIUM   No results found for: PHENYTOIN, PHENOBARB, VALPROATE, CBMZ   .  res Assessment: Plan:   Discussed potential benefits, risks, and side effects of BuSpar and discussed that this may be helpful for her anxiety since Cymbalta is currently helping depressive signs and symptoms but she continues to have uncontrolled anxiety. Start BuSpar 15 mg 1/3 tablet twice daily for 1 week, then increase to 2/3 tablet twice daily for 1 week, then increase to 1 tablet twice daily for anxiety. Continue Cymbalta 60 mg daily for depression and anxiety. Recommend continuing to see therapist. Generalized anxiety disorder - Plan: busPIRone (BUSPAR) 15 MG tablet, DULoxetine (CYMBALTA) 60 MG capsule  Major depressive disorder, recurrent episode, moderate ([place]) - Plan: DULoxetine (CYMBALTA) 60 MG capsule  Please see After Visit Summary for patient specific instructions.  Future Appointments  Date Time Provider [place]  10/03/2018  9:00 AM Cottle, Lucious Groves, LCSW [facility] None  10/18/2018 11:00 AM Thayer Headings, PMHNP [facility] None    No orders of the defined types were placed in this encounter.     -------------------------------

## 2018-09-12 DIAGNOSIS — D2262 Melanocytic nevi of left upper limb, including shoulder: Secondary | ICD-10-CM | POA: Diagnosis not present

## 2018-09-12 DIAGNOSIS — D225 Melanocytic nevi of trunk: Secondary | ICD-10-CM | POA: Diagnosis not present

## 2018-09-12 DIAGNOSIS — D2261 Melanocytic nevi of right upper limb, including shoulder: Secondary | ICD-10-CM | POA: Diagnosis not present

## 2018-10-03 ENCOUNTER — Ambulatory Visit: Payer: 59 | Admitting: Psychology

## 2018-10-03 DIAGNOSIS — F321 Major depressive disorder, single episode, moderate: Secondary | ICD-10-CM | POA: Diagnosis not present

## 2018-10-03 DIAGNOSIS — F4323 Adjustment disorder with mixed anxiety and depressed mood: Secondary | ICD-10-CM | POA: Diagnosis not present

## 2018-10-17 ENCOUNTER — Ambulatory Visit: Payer: 59 | Admitting: Psychology

## 2018-10-17 DIAGNOSIS — F4323 Adjustment disorder with mixed anxiety and depressed mood: Secondary | ICD-10-CM

## 2018-10-17 DIAGNOSIS — F321 Major depressive disorder, single episode, moderate: Secondary | ICD-10-CM

## 2018-10-18 ENCOUNTER — Encounter: Payer: Self-pay | Admitting: Psychiatry

## 2018-10-18 ENCOUNTER — Ambulatory Visit: Payer: 59 | Admitting: Psychiatry

## 2018-10-18 VITALS — BP 118/73 | HR 49

## 2018-10-18 DIAGNOSIS — F331 Major depressive disorder, recurrent, moderate: Secondary | ICD-10-CM

## 2018-10-18 DIAGNOSIS — F411 Generalized anxiety disorder: Secondary | ICD-10-CM

## 2018-10-18 MED ORDER — BUSPIRONE HCL 15 MG PO TABS: 15.0000 mg | ORAL_TABLET | Freq: Two times a day (BID) | ORAL | 2 refills | Status: DC

## 2018-10-18 NOTE — Progress Notes (Signed)
Kristin Sparks [id number] November 01, 1955 63 y.o.  Subjective:   Patient ID:  Kristin Sparks is a 63 y.o. (DOB 1955/12/29) female.  Chief Complaint:  Chief Complaint  Patient presents with  . Anxiety  . Depression    HPI Kristin Sparks presents to the office today for follow-up of anxiety and depression. She reports that she waited a couple of weeks before starting Buspar. Reports that she started Buspar about 3 weeks ago and started 15 mg po BID several days ago. She reports that she is "still a little nervous." She reports that she briefly tried lowering dose of Cymbalta to 40 mg po qd. and noticed lower mood and energy and immediately increased it to 60 mg po qd. Reports that she had very low energy in December and is working with therapist to rest more and not be over-scheduled. Reports that energy is starting to improve with lifestyle changes. She reports that her mood was "up and down over the holidays." She reports that "when I compare myself to where I was last year, I am doing better." She reports that her therapist told her yesterday that she has seen improvements in her mood and anxiety. She reports that she continues to have some mild depression. Motivation is adequate. No change in appetite. She reports concentration is "getting a little bit better but not quite where I want it to be." Reports that she is starting to enjoy some things and look forward to things. Denies SI.   Reports that she has had abdominal cramping since having diarrhea in August. She describes having lower abdominal pain that will sometimes radiate. Describes abdominal pain as cramping and burning. Denies rebound tenderness. Reports that pain and cramping stopped for a few days last week and then returned.   Has set up some in home assistance for her husband.   Review of Systems:  Review of Systems  Gastrointestinal: Positive for abdominal pain. Negative for constipation, diarrhea, nausea and vomiting.   Musculoskeletal: Negative for gait problem.  Neurological: Negative for tremors.  Psychiatric/Behavioral:       Please refer to HPI    Medications: I have reviewed the patient's current medications.  Current Outpatient Medications  Medication Sig Dispense Refill  . albuterol (PROVENTIL HFA;VENTOLIN HFA) 108 (90 Base) MCG/ACT inhaler Inhale into the lungs.    . busPIRone (BUSPAR) 15 MG tablet Take 1 tablet (15 mg total) by mouth 2 (two) times daily for 30 days. 60 tablet 2  . calcium-vitamin D (SM CALCIUM 500/VITAMIN D3) 500-400 MG-UNIT tablet Take by mouth.    . DULoxetine (CYMBALTA) 60 MG capsule Take 1 capsule (60 mg total) by mouth daily. 90 capsule 1  . Fluticasone-Salmeterol (ADVAIR DISKUS) 250-50 MCG/DOSE AEPB Inhale into the lungs.    . hydrOXYzine (ATARAX/VISTARIL) 10 MG tablet Take 10 mg by mouth at bedtime as needed.    Marland Kitchen levocetirizine (XYZAL) 5 MG tablet Take 5 mg by mouth daily as needed.     . lovastatin (MEVACOR) 20 MG tablet Take 20 mg by mouth once a week.     . Omega-3 1000 MG CAPS Take by mouth.    . pregabalin (LYRICA) 75 MG capsule Take 1 capsule (75 mg total) by mouth every evening. (Patient not taking: Reported on 09/06/2018) 90 capsule 1   No current facility-administered medications for this visit.     Medication Side Effects: None  Allergies:  Allergies  Allergen Reactions  . Latex   . Sulfa Antibiotics Rash  Past Medical History:  Diagnosis Date  . Allergic rhinitis   . Asthma   . Elevated cholesterol   . Neuropathy     Family History  Problem Relation Age of Onset  . Alcohol abuse Father   . Depression Sister     Social History   Socioeconomic History  . Marital status: Married    Spouse name: Not on file  . Number of children: Not on file  . Years of education: Not on file  . Highest education level: Not on file  Occupational History  . Not on file  Social Needs  . Financial resource strain: Not on file  . Food insecurity:     Worry: Not on file    Inability: Not on file  . Transportation needs:    Medical: Not on file    Non-medical: Not on file  Tobacco Use  . Smoking status: Never Smoker  . Smokeless tobacco: Never Used  Substance and Sexual Activity  . Alcohol use: Not on file  . Drug use: Not on file  . Sexual activity: Not on file  Lifestyle  . Physical activity:    Days per week: Not on file    Minutes per session: Not on file  . Stress: Not on file  Relationships  . Social connections:    Talks on phone: Not on file    Gets together: Not on file    Attends religious service: Not on file    Active member of club or organization: Not on file    Attends meetings of clubs or organizations: Not on file    Relationship status: Not on file  . Intimate partner violence:    Fear of current or ex partner: Not on file    Emotionally abused: Not on file    Physically abused: Not on file    Forced sexual activity: Not on file  Other Topics Concern  . Not on file  Social History Narrative  . Not on file    Past Medical History, Surgical history, Social history, and Family history were reviewed and updated as appropriate.   Please see review of systems for further details on the patient's review from today.   Objective:   Physical Exam:  BP 118/73   Pulse (!) 49   Physical Exam Constitutional:      General: She is not in acute distress.    Appearance: She is well-developed.  Musculoskeletal:        General: No deformity.  Neurological:     Mental Status: She is alert and oriented to person, place, and time.     Coordination: Coordination normal.  Psychiatric:        Mood and Affect: Mood is anxious. Mood is not depressed. Affect is not labile, blunt, angry or inappropriate.        Speech: Speech normal.        Behavior: Behavior normal.        Thought Content: Thought content normal. Thought content does not include homicidal or suicidal ideation. Thought content does not include  homicidal or suicidal plan.        Judgment: Judgment normal.     Comments: Patient presents as less anxious and less depressed compared to previous exams. Insight intact. No auditory or visual hallucinations. No delusions.      Lab Review:  No results found for: NA, K, CL, CO2, GLUCOSE, BUN, CREATININE, CALCIUM, PROT, ALBUMIN, AST, ALT, ALKPHOS, BILITOT, GFRNONAA, GFRAA  No results found  for: WBC, RBC, HGB, HCT, PLT, MCV, MCH, MCHC, RDW, LYMPHSABS, MONOABS, EOSABS, BASOSABS  No results found for: POCLITH, LITHIUM   No results found for: PHENYTOIN, PHENOBARB, VALPROATE, CBMZ   .res Assessment: Plan:   Discussed continuing BuSpar 15 mg twice daily for anxiety since more time is needed to determine full response since patient has only been at full dose for several days, since patient is tolerating BuSpar and may be experiencing some slight improvement in anxiety signs and symptoms. Continue Cymbalta 60 mg daily for depression and anxiety.  Answered patient's questions about potential benefits, risk, and side effects of increasing Cymbalta and discussed that this may be a possible treatment option if anxiety is not improved with BuSpar. Discussed possibly seeing PCP about ongoing abdominal pain, particularly if pain worsens or does not improve once BuSpar becomes therapeutic since patient questions if abdominal pain could be anxiety related. Recommend continuing to see Kara Pacer, LCSW for therapy. Patient to follow-up in 3 months or sooner if clinically indicated. Patient advised to contact office with any questions, adverse effects, or acute worsening in signs and symptoms.  Generalized anxiety disorder - Plan: busPIRone (BUSPAR) 15 MG tablet  Major depressive disorder, recurrent episode, moderate (HCC)  Generalized anxiety disorder - Unstable - Plan: busPIRone (BUSPAR) 15 MG tablet  Please see After Visit Summary for patient specific instructions.  Future Appointments  Date Time  Provider [place]  10/31/2018  9:00 AM Cottle, Lucious Groves, LCSW [facility] None  11/14/2018  9:00 AM Cottle, Bambi G, LCSW [facility] None  01/08/2019  9:00 AM Thayer Headings, PMHNP [facility] None    No orders of the defined types were placed in this encounter.     -------------------------------

## 2018-10-31 ENCOUNTER — Ambulatory Visit: Payer: 59 | Admitting: Psychology

## 2018-10-31 DIAGNOSIS — F4323 Adjustment disorder with mixed anxiety and depressed mood: Secondary | ICD-10-CM | POA: Diagnosis not present

## 2018-10-31 DIAGNOSIS — F321 Major depressive disorder, single episode, moderate: Secondary | ICD-10-CM

## 2018-11-01 ENCOUNTER — Other Ambulatory Visit: Payer: Self-pay | Admitting: Psychiatry

## 2018-11-14 ENCOUNTER — Ambulatory Visit: Payer: 59 | Admitting: Psychology

## 2018-11-14 DIAGNOSIS — F4323 Adjustment disorder with mixed anxiety and depressed mood: Secondary | ICD-10-CM | POA: Diagnosis not present

## 2018-11-14 DIAGNOSIS — F321 Major depressive disorder, single episode, moderate: Secondary | ICD-10-CM

## 2018-11-28 ENCOUNTER — Ambulatory Visit: Payer: 59 | Admitting: Psychology

## 2018-11-28 ENCOUNTER — Telehealth: Payer: Self-pay | Admitting: Psychiatry

## 2018-11-28 DIAGNOSIS — F321 Major depressive disorder, single episode, moderate: Secondary | ICD-10-CM

## 2018-11-28 DIAGNOSIS — F4323 Adjustment disorder with mixed anxiety and depressed mood: Secondary | ICD-10-CM

## 2018-11-28 DIAGNOSIS — F411 Generalized anxiety disorder: Secondary | ICD-10-CM

## 2018-11-28 MED ORDER — BUSPIRONE HCL 30 MG PO TABS: ORAL_TABLET | ORAL | 2 refills | Status: DC

## 2018-11-28 NOTE — Telephone Encounter (Signed)
Pt wants to increase her Buspar. Send to [facility] on [address] [place], [place]

## 2018-11-28 NOTE — Telephone Encounter (Signed)
Left voicemail with instructions and to call back with any problems or concerns.

## 2018-12-05 DIAGNOSIS — J4520 Mild intermittent asthma, uncomplicated: Secondary | ICD-10-CM | POA: Diagnosis not present

## 2018-12-05 DIAGNOSIS — R1031 Right lower quadrant pain: Secondary | ICD-10-CM | POA: Diagnosis not present

## 2018-12-06 DIAGNOSIS — R1032 Left lower quadrant pain: Secondary | ICD-10-CM | POA: Diagnosis not present

## 2018-12-06 DIAGNOSIS — R195 Other fecal abnormalities: Secondary | ICD-10-CM | POA: Diagnosis not present

## 2018-12-06 DIAGNOSIS — R1031 Right lower quadrant pain: Secondary | ICD-10-CM | POA: Diagnosis not present

## 2018-12-11 ENCOUNTER — Ambulatory Visit: Payer: 59 | Admitting: Psychology

## 2018-12-12 ENCOUNTER — Ambulatory Visit: Payer: 59 | Admitting: Psychology

## 2018-12-12 ENCOUNTER — Other Ambulatory Visit: Payer: Self-pay

## 2018-12-12 DIAGNOSIS — F321 Major depressive disorder, single episode, moderate: Secondary | ICD-10-CM

## 2018-12-12 DIAGNOSIS — F4323 Adjustment disorder with mixed anxiety and depressed mood: Secondary | ICD-10-CM | POA: Diagnosis not present

## 2018-12-26 ENCOUNTER — Ambulatory Visit (INDEPENDENT_AMBULATORY_CARE_PROVIDER_SITE_OTHER): Payer: 59 | Admitting: Psychology

## 2018-12-26 DIAGNOSIS — F4323 Adjustment disorder with mixed anxiety and depressed mood: Secondary | ICD-10-CM | POA: Diagnosis not present

## 2018-12-26 DIAGNOSIS — F3110 Bipolar disorder, current episode manic without psychotic features, unspecified: Secondary | ICD-10-CM | POA: Diagnosis not present

## 2019-01-08 ENCOUNTER — Ambulatory Visit: Payer: 59 | Admitting: Psychiatry

## 2019-01-09 ENCOUNTER — Ambulatory Visit (INDEPENDENT_AMBULATORY_CARE_PROVIDER_SITE_OTHER): Payer: 59 | Admitting: Psychology

## 2019-01-09 DIAGNOSIS — F4323 Adjustment disorder with mixed anxiety and depressed mood: Secondary | ICD-10-CM

## 2019-01-09 DIAGNOSIS — F321 Major depressive disorder, single episode, moderate: Secondary | ICD-10-CM

## 2019-01-22 DIAGNOSIS — Z6824 Body mass index (BMI) 24.0-24.9, adult: Secondary | ICD-10-CM | POA: Diagnosis not present

## 2019-01-22 DIAGNOSIS — Z01419 Encounter for gynecological examination (general) (routine) without abnormal findings: Secondary | ICD-10-CM | POA: Diagnosis not present

## 2019-01-23 ENCOUNTER — Ambulatory Visit (INDEPENDENT_AMBULATORY_CARE_PROVIDER_SITE_OTHER): Payer: 59 | Admitting: Psychology

## 2019-01-23 DIAGNOSIS — J4520 Mild intermittent asthma, uncomplicated: Secondary | ICD-10-CM | POA: Diagnosis not present

## 2019-01-23 DIAGNOSIS — F321 Major depressive disorder, single episode, moderate: Secondary | ICD-10-CM

## 2019-01-23 DIAGNOSIS — F4323 Adjustment disorder with mixed anxiety and depressed mood: Secondary | ICD-10-CM | POA: Diagnosis not present

## 2019-01-23 DIAGNOSIS — Z636 Dependent relative needing care at home: Secondary | ICD-10-CM | POA: Diagnosis not present

## 2019-01-26 ENCOUNTER — Other Ambulatory Visit: Payer: Self-pay | Admitting: Psychiatry

## 2019-01-29 DIAGNOSIS — Z0000 Encounter for general adult medical examination without abnormal findings: Secondary | ICD-10-CM | POA: Diagnosis not present

## 2019-01-29 DIAGNOSIS — M7731 Calcaneal spur, right foot: Secondary | ICD-10-CM | POA: Diagnosis not present

## 2019-01-29 DIAGNOSIS — M79671 Pain in right foot: Secondary | ICD-10-CM | POA: Diagnosis not present

## 2019-01-29 DIAGNOSIS — J4520 Mild intermittent asthma, uncomplicated: Secondary | ICD-10-CM | POA: Diagnosis not present

## 2019-01-30 ENCOUNTER — Other Ambulatory Visit: Payer: Self-pay

## 2019-01-30 ENCOUNTER — Encounter: Payer: Self-pay | Admitting: Psychiatry

## 2019-01-30 ENCOUNTER — Ambulatory Visit (INDEPENDENT_AMBULATORY_CARE_PROVIDER_SITE_OTHER): Payer: 59 | Admitting: Psychiatry

## 2019-01-30 DIAGNOSIS — F411 Generalized anxiety disorder: Secondary | ICD-10-CM

## 2019-01-30 DIAGNOSIS — F331 Major depressive disorder, recurrent, moderate: Secondary | ICD-10-CM

## 2019-01-30 MED ORDER — DULOXETINE HCL 20 MG PO CPEP: 20.0000 mg | ORAL_CAPSULE | Freq: Every day | ORAL | 1 refills | Status: DC

## 2019-01-30 MED ORDER — BUSPIRONE HCL 30 MG PO TABS: 30.0000 mg | ORAL_TABLET | Freq: Two times a day (BID) | ORAL | 2 refills | Status: DC

## 2019-01-30 MED ORDER — DULOXETINE HCL 60 MG PO CPEP: 60.0000 mg | ORAL_CAPSULE | Freq: Every day | ORAL | 2 refills | Status: DC

## 2019-01-30 NOTE — Progress Notes (Signed)
Kristin Sparks [id number] [birth year] 63 y.o.  Virtual Visit via Telephone Note  I connected with@ on 01/30/19 at  3:15 PM EDT by telephone and verified that I am speaking with the correct person using two identifiers.   I discussed the limitations, risks, security and privacy concerns of performing an evaluation and management service by telephone and the availability of in person appointments. I also discussed with the patient that there may be a patient responsible charge related to this service. The patient expressed understanding and agreed to proceed.   I discussed the assessment and treatment plan with the patient. The patient was provided an opportunity to ask questions and all were answered. The patient agreed with the plan and demonstrated an understanding of the instructions.   The patient was advised to call back or seek an in-person evaluation if the symptoms worsen or if the condition fails to improve as anticipated.  I provided 30 minutes of non-face-to-face time during this encounter.  The patient was located at home.  The provider was located at home.   Thayer Headings, PMHNP   Subjective:   Patient ID:  Kristin Sparks is a 63 y.o. (DOB 1955/10/21) female.  Chief Complaint:  Chief Complaint  Patient presents with  . Anxiety  . Depression    HPI Kristin Sparks presents for follow-up of anxiety and depression. She reports that there have been some challenges with caregiving during the pandemic. She reports that she has had some increased anxiety. She reports that she has also noticed some muscle tension and being easily startled. Reports occ GI s/s which seem to be anxiety related. She reports that she has not had any panic attacks. She reports "the depression is a little bit better." Reports some continued mild depression. She reports that her energy and motivation has been ok with occasional low days. She reports that her app "is about the same." She reports that her  concentration is "still a little off."  She reports that she will periodically take hydroxyzine 25 mg po qhs prn insomnia, about once a week. She reports that most nights she sleeps without difficulty. Denies SI.   Reports that in retrospect she has had multiple physical s/s throughout her lifetime that have likely been r/t anxiety.   She continues to see [name], LCSW for therapy.  Past medication trials: Sertraline-diarrhea. Felt calmer. Lexapro-prescribed for hot flashes. Had increased anxiety while taking. Effexor XR-some benefit and then was less effective. Signs and symptoms inadequately controlled at 75 mg and unable to tolerate higher doses. Cymbalta Propanolol Ativan Hydroxyzine  Review of Systems:  Review of Systems  Gastrointestinal: Positive for abdominal pain.       Occ loose stools  Musculoskeletal: Negative for gait problem.       Has been having some pain in her heels  Neurological: Negative for tremors.  Psychiatric/Behavioral:       Please refer to HPI    Medications: I have reviewed the patient's current medications.  Current Outpatient Medications  Medication Sig Dispense Refill  . albuterol (PROVENTIL HFA;VENTOLIN HFA) 108 (90 Base) MCG/ACT inhaler Inhale into the lungs.    . busPIRone (BUSPAR) 30 MG tablet Take 1 tablet (30 mg total) by mouth 2 (two) times a day for 30 days. 60 tablet 2  . calcium-vitamin D (SM CALCIUM 500/VITAMIN D3) 500-400 MG-UNIT tablet Take by mouth.    . Fluticasone-Salmeterol (ADVAIR DISKUS) 250-50 MCG/DOSE AEPB Inhale into the lungs.    . hydrOXYzine (ATARAX/VISTARIL)  25 MG tablet Take 10 mg by mouth at bedtime as needed.     Marland Kitchen levocetirizine (XYZAL) 5 MG tablet Take 5 mg by mouth daily as needed.     . lovastatin (MEVACOR) 20 MG tablet Take 20 mg by mouth once a week.     . meloxicam (MOBIC) 7.5 MG tablet Take by mouth.    . Omega-3 1000 MG CAPS Take by mouth.    . pregabalin (LYRICA) 75 MG capsule Take 1 capsule (75 mg  total) by mouth every evening. 90 capsule 1  . DULoxetine (CYMBALTA) 20 MG capsule Take 1 capsule (20 mg total) by mouth daily for 30 days. Take 1 capsule po qd with a 60 mg capsule to equal 80 mg total dose. 30 capsule 1  . DULoxetine (CYMBALTA) 60 MG capsule Take 1 capsule (60 mg total) by mouth daily. Take with a 20 mg capsule to equal 80 mg daily 30 capsule 2   No current facility-administered medications for this visit.     Medication Side Effects: None  Allergies:  Allergies  Allergen Reactions  . Latex   . Sulfa Antibiotics Rash    Past Medical History:  Diagnosis Date  . Allergic rhinitis   . Asthma   . Elevated cholesterol   . Neuropathy     Family History  Problem Relation Age of Onset  . Alcohol abuse Father   . Depression Sister     Social History   Socioeconomic History  . Marital status: Married    Spouse name: Not on file  . Number of children: Not on file  . Years of education: Not on file  . Highest education level: Not on file  Occupational History  . Not on file  Social Needs  . Financial resource strain: Not on file  . Food insecurity:    Worry: Not on file    Inability: Not on file  . Transportation needs:    Medical: Not on file    Non-medical: Not on file  Tobacco Use  . Smoking status: Never Smoker  . Smokeless tobacco: Never Used  Substance and Sexual Activity  . Alcohol use: Not on file  . Drug use: Not on file  . Sexual activity: Not on file  Lifestyle  . Physical activity:    Days per week: Not on file    Minutes per session: Not on file  . Stress: Not on file  Relationships  . Social connections:    Talks on phone: Not on file    Gets together: Not on file    Attends religious service: Not on file    Active member of club or organization: Not on file    Attends meetings of clubs or organizations: Not on file    Relationship status: Not on file  . Intimate partner violence:    Fear of current or ex partner: Not on file     Emotionally abused: Not on file    Physically abused: Not on file    Forced sexual activity: Not on file  Other Topics Concern  . Not on file  Social History Narrative  . Not on file    Past Medical History, Surgical history, Social history, and Family history were reviewed and updated as appropriate.   Please see review of systems for further details on the patient's review from today.   Objective:   Physical Exam:  There were no vitals taken for this visit.  Physical Exam Neurological:  Mental Status: She is alert and oriented to person, place, and time.     Cranial Nerves: No dysarthria.  Psychiatric:        Attention and Perception: Attention normal.        Mood and Affect: Mood is anxious.        Speech: Speech normal.        Behavior: Behavior is cooperative.        Thought Content: Thought content normal. Thought content is not paranoid or delusional. Thought content does not include homicidal or suicidal ideation. Thought content does not include homicidal or suicidal plan.        Cognition and Memory: Cognition and memory normal.        Judgment: Judgment normal.     Comments: Mood presents as mildly depressed     Lab Review:  No results found for: NA, K, CL, CO2, GLUCOSE, BUN, CREATININE, CALCIUM, PROT, ALBUMIN, AST, ALT, ALKPHOS, BILITOT, GFRNONAA, GFRAA  No results found for: WBC, RBC, HGB, HCT, PLT, MCV, MCH, MCHC, RDW, LYMPHSABS, MONOABS, EOSABS, BASOSABS  No results found for: POCLITH, LITHIUM   No results found for: PHENYTOIN, PHENOBARB, VALPROATE, CBMZ   .res Assessment: Plan:   Will increase Cymbalta to 80 mg daily to further improve anxiety and depressive signs and symptoms since patient has had a partial response at 60 mg daily.  Will slowly titrate dose since patient has had initial insomnia with starting and increasing Cymbalta and prefers to use lowest possible effective dose. May consider further increased titration in the future if  appropriate.  Will continue BuSpar 30 mg twice daily for anxiety patient reports that BuSpar has been effective for her.  Discussed that 60 mg total daily dose is next dose approved by the FDA. Will continue to use hydroxyzine 25 mg p.o. nightly as needed for insomnia.  Encourage patient to use hydroxyzine as needed if she experiences some initial insomnia with increase in Cymbalta. Recommend continuing psychotherapy with Bambi Cottle, LCSW. Patient follow-up in 5 weeks or sooner if clinically indicated. Patient advised to contact office with any questions, adverse effects, or acute worsening in signs and symptoms.   Generalized anxiety disorder - Unstable - Plan: DULoxetine (CYMBALTA) 60 MG capsule, DULoxetine (CYMBALTA) 20 MG capsule, busPIRone (BUSPAR) 30 MG tablet  Major depressive disorder, recurrent episode, moderate (HCC) - improved with Cymbalta - Plan: DULoxetine (CYMBALTA) 60 MG capsule, DULoxetine (CYMBALTA) 20 MG capsule  Please see After Visit Summary for patient specific instructions.  Future Appointments  Date Time Provider [place]  02/06/2019  9:00 AM Cottle, Lucious Groves, LCSW [facility] None  02/20/2019  9:00 AM Cottle, Bambi G, LCSW [facility] None  03/06/2019  9:00 AM Cottle, Bambi G, LCSW [facility] None  03/07/2019  9:00 AM Thayer Headings, PMHNP [facility] None    No orders of the defined types were placed in this encounter.     -------------------------------

## 2019-02-01 ENCOUNTER — Other Ambulatory Visit: Payer: Self-pay | Admitting: Psychiatry

## 2019-02-06 ENCOUNTER — Ambulatory Visit (INDEPENDENT_AMBULATORY_CARE_PROVIDER_SITE_OTHER): Payer: 59 | Admitting: Psychology

## 2019-02-06 DIAGNOSIS — F411 Generalized anxiety disorder: Secondary | ICD-10-CM

## 2019-02-06 DIAGNOSIS — F4323 Adjustment disorder with mixed anxiety and depressed mood: Secondary | ICD-10-CM

## 2019-02-06 DIAGNOSIS — F321 Major depressive disorder, single episode, moderate: Secondary | ICD-10-CM

## 2019-02-09 DIAGNOSIS — H16252 Phlyctenular keratoconjunctivitis, left eye: Secondary | ICD-10-CM | POA: Diagnosis not present

## 2019-02-13 DIAGNOSIS — M7731 Calcaneal spur, right foot: Secondary | ICD-10-CM | POA: Diagnosis not present

## 2019-02-13 DIAGNOSIS — M722 Plantar fascial fibromatosis: Secondary | ICD-10-CM | POA: Diagnosis not present

## 2019-02-13 DIAGNOSIS — M7661 Achilles tendinitis, right leg: Secondary | ICD-10-CM | POA: Diagnosis not present

## 2019-02-20 ENCOUNTER — Ambulatory Visit (INDEPENDENT_AMBULATORY_CARE_PROVIDER_SITE_OTHER): Payer: 59 | Admitting: Psychology

## 2019-02-20 DIAGNOSIS — F4323 Adjustment disorder with mixed anxiety and depressed mood: Secondary | ICD-10-CM | POA: Diagnosis not present

## 2019-02-20 DIAGNOSIS — F4321 Adjustment disorder with depressed mood: Secondary | ICD-10-CM | POA: Diagnosis not present

## 2019-02-20 DIAGNOSIS — F411 Generalized anxiety disorder: Secondary | ICD-10-CM | POA: Diagnosis not present

## 2019-03-06 ENCOUNTER — Ambulatory Visit (INDEPENDENT_AMBULATORY_CARE_PROVIDER_SITE_OTHER): Payer: 59 | Admitting: Psychology

## 2019-03-06 DIAGNOSIS — F4323 Adjustment disorder with mixed anxiety and depressed mood: Secondary | ICD-10-CM

## 2019-03-06 DIAGNOSIS — F411 Generalized anxiety disorder: Secondary | ICD-10-CM | POA: Diagnosis not present

## 2019-03-06 DIAGNOSIS — F321 Major depressive disorder, single episode, moderate: Secondary | ICD-10-CM

## 2019-03-07 ENCOUNTER — Ambulatory Visit: Payer: 59 | Admitting: Psychiatry

## 2019-03-07 ENCOUNTER — Other Ambulatory Visit: Payer: Self-pay

## 2019-03-07 ENCOUNTER — Encounter: Payer: Self-pay | Admitting: Psychiatry

## 2019-03-07 DIAGNOSIS — F411 Generalized anxiety disorder: Secondary | ICD-10-CM

## 2019-03-07 DIAGNOSIS — G4700 Insomnia, unspecified: Secondary | ICD-10-CM

## 2019-03-07 DIAGNOSIS — F330 Major depressive disorder, recurrent, mild: Secondary | ICD-10-CM | POA: Diagnosis not present

## 2019-03-07 MED ORDER — DULOXETINE HCL 20 MG PO CPEP: 20.0000 mg | ORAL_CAPSULE | Freq: Every day | ORAL | 2 refills | Status: DC

## 2019-03-07 MED ORDER — DULOXETINE HCL 60 MG PO CPEP: 60.0000 mg | ORAL_CAPSULE | Freq: Every day | ORAL | 2 refills | Status: DC

## 2019-03-07 MED ORDER — HYDROXYZINE HCL 10 MG PO TABS: 10.0000 mg | ORAL_TABLET | Freq: Every evening | ORAL | 2 refills | Status: AC | PRN

## 2019-03-07 MED ORDER — BUSPIRONE HCL 30 MG PO TABS: 30.0000 mg | ORAL_TABLET | Freq: Two times a day (BID) | ORAL | 2 refills | Status: DC

## 2019-03-07 NOTE — Progress Notes (Signed)
ANAVICTORIA Sparks [id number] Mar 19, 1956 63 y.o.  Subjective:   Patient ID:  Kristin Sparks is a 63 y.o. (DOB 07/03/1956) female.  Chief Complaint:  Chief Complaint  Patient presents with  . Anxiety  . Depression    HPI BALEY SHANDS presents to the office today for follow-up of anxiety and depression. She reports that she continues to see her therapist [name], LCSW and reports that therapist has commented that pt continues to manage her anxiety by staying busy. She reports, "I can't sit with my thoughts." Will clean frequently and help take care of others and also her son's puppy. "I'm just exhausted... I push myself to exhaustion." She reports that she has difficulty sitting down to relax and do things like read a book.   She reports periods of situational depression. Notices her mood is lower when she has been around her husband for longer periods of time. Notices occ negative thoughts. She reports that the more tired she is, the more difficulty she has going to sleep. She reports that her sleep has "been good overall" with occ difficulty falling asleep with increased anxiety. She reports that her mood has been more positive and has periods where mood is good. Reports that her appetite is about the same. She reports that her concentration is "not great" and that she has difficulty focusing on a book. Denies SI.   Sister's husband has dementia as well and he is on hospice. She has been going to see her sister in [place] on the weekends. Sister's dog has had a stroke and this has been upsetting to her sister.   Reports taking Hydroxyzine prn about twice a month.   Past medication trials: Sertraline-diarrhea. Felt calmer. Lexapro-prescribed for hot flashes. Had increased anxiety while taking. Effexor XR-some benefit and then was less effective. Signs and symptoms inadequately controlled at 75 mg and unable to tolerate higher  doses. Cymbalta Buspar Propanolol Ativan Hydroxyzine Lyrica Gabapentin-did not help with neuropathy  Review of Systems:  Review of Systems  Constitutional:       Night sweats  Gastrointestinal: Positive for abdominal distention.  Musculoskeletal: Negative for gait problem.       Plantar fascitis  Neurological: Negative for tremors.  Psychiatric/Behavioral:       Please refer to HPI   Notices some improvement in neuropathy with Cymbalta 80 mg.   Medications: I have reviewed the patient's current medications.  Current Outpatient Medications  Medication Sig Dispense Refill  . albuterol (PROVENTIL HFA;VENTOLIN HFA) 108 (90 Base) MCG/ACT inhaler Inhale into the lungs.    . calcium-vitamin D (SM CALCIUM 500/VITAMIN D3) 500-400 MG-UNIT tablet Take by mouth.    . DULoxetine (CYMBALTA) 60 MG capsule Take 1 capsule (60 mg total) by mouth daily. Take with a 20 mg capsule to equal 80 mg daily 30 capsule 2  . levocetirizine (XYZAL) 5 MG tablet Take 5 mg by mouth daily as needed.     . lovastatin (MEVACOR) 20 MG tablet Take 20 mg by mouth once a week.     . meloxicam (MOBIC) 7.5 MG tablet Take 7.5 mg by mouth daily as needed.     . Omega-3 1000 MG CAPS Take by mouth.    . pregabalin (LYRICA) 75 MG capsule Take 1 capsule (75 mg total) by mouth every evening. 90 capsule 1  . busPIRone (BUSPAR) 30 MG tablet Take 1 tablet (30 mg total) by mouth 2 (two) times a day for 30 days. 60 tablet 2  .  DULoxetine (CYMBALTA) 20 MG capsule Take 1 capsule (20 mg total) by mouth daily for 30 days. Take 1 capsule po qd with a 60 mg capsule to equal 80 mg total dose. 30 capsule 2  . Fluticasone-Salmeterol (ADVAIR DISKUS) 250-50 MCG/DOSE AEPB Inhale into the lungs.    . hydrOXYzine (ATARAX/VISTARIL) 10 MG tablet Take 1 tablet (10 mg total) by mouth at bedtime as needed for up to 30 days. 30 tablet 2   No current facility-administered medications for this visit.     Medication Side Effects: Other: Hot flashes  with higher dose of Cymbalta, mouth sores (resolved), dry mouth  Allergies:  Allergies  Allergen Reactions  . Latex   . Sulfa Antibiotics Rash    Past Medical History:  Diagnosis Date  . Allergic rhinitis   . Asthma   . Elevated cholesterol   . Neuropathy     Family History  Problem Relation Age of Onset  . Alcohol abuse Father   . Depression Sister     Social History   Socioeconomic History  . Marital status: Married    Spouse name: Not on file  . Number of children: Not on file  . Years of education: Not on file  . Highest education level: Not on file  Occupational History  . Not on file  Social Needs  . Financial resource strain: Not on file  . Food insecurity:    Worry: Not on file    Inability: Not on file  . Transportation needs:    Medical: Not on file    Non-medical: Not on file  Tobacco Use  . Smoking status: Never Smoker  . Smokeless tobacco: Never Used  Substance and Sexual Activity  . Alcohol use: Not on file  . Drug use: Not on file  . Sexual activity: Not on file  Lifestyle  . Physical activity:    Days per week: Not on file    Minutes per session: Not on file  . Stress: Not on file  Relationships  . Social connections:    Talks on phone: Not on file    Gets together: Not on file    Attends religious service: Not on file    Active member of club or organization: Not on file    Attends meetings of clubs or organizations: Not on file    Relationship status: Not on file  . Intimate partner violence:    Fear of current or ex partner: Not on file    Emotionally abused: Not on file    Physically abused: Not on file    Forced sexual activity: Not on file  Other Topics Concern  . Not on file  Social History Narrative  . Not on file    Past Medical History, Surgical history, Social history, and Family history were reviewed and updated as appropriate.   Please see review of systems for further details on the patient's review from today.    Objective:   Physical Exam:  There were no vitals taken for this visit.  Physical Exam Constitutional:      General: She is not in acute distress.    Appearance: She is well-developed.  Musculoskeletal:        General: No deformity.  Neurological:     Mental Status: She is alert and oriented to person, place, and time.     Coordination: Coordination normal.  Psychiatric:        Mood and Affect: Mood is anxious. Mood is not depressed. Affect  is not labile, blunt, angry or inappropriate.        Speech: Speech normal.        Behavior: Behavior normal.        Thought Content: Thought content normal. Thought content does not include homicidal or suicidal ideation. Thought content does not include homicidal or suicidal plan.        Judgment: Judgment normal.     Comments: Insight intact. No auditory or visual hallucinations. No delusions.      Lab Review:  No results found for: NA, K, CL, CO2, GLUCOSE, BUN, CREATININE, CALCIUM, PROT, ALBUMIN, AST, ALT, ALKPHOS, BILITOT, GFRNONAA, GFRAA  No results found for: WBC, RBC, HGB, HCT, PLT, MCV, MCH, MCHC, RDW, LYMPHSABS, MONOABS, EOSABS, BASOSABS  No results found for: POCLITH, LITHIUM   No results found for: PHENYTOIN, PHENOBARB, VALPROATE, CBMZ   .res Assessment: Plan:   Discussed treatment plan with pt and she reports that benefits in mood with Cymbalta 80 mg po qd are currently outweighing side effects. Discussed that with initiation and increases in dose of Cymbalta pt has had periods of increased activation and that these side effects have gradually improved and resolved in the past. Discussed that side effects may continue to gradually improve with 80 mg dose. Discussed that increased anxiety may be related to increased activation with recent dose increase in Cymbalta or possibly situational. Agreed to continue Cymbalta 80 mg po qd and to continue to closely monitor anxiety.  Continue Buspar 30 mg po BID for anxiety. Continue  Hydroxyzine 10 mg po QHS prn insomnia or anxiety.  Recommend continuing psychotherapy with Bambi Cottle, LCSW. Pt to f/u in 2 months or sooner if clinically indicated.    Generalized anxiety disorder - Plan: busPIRone (BUSPAR) 30 MG tablet, DULoxetine (CYMBALTA) 20 MG capsule, DULoxetine (CYMBALTA) 60 MG capsule  Mild episode of recurrent major depressive disorder (HCC)  Generalized anxiety disorder - Unstable - Plan: busPIRone (BUSPAR) 30 MG tablet, DULoxetine (CYMBALTA) 20 MG capsule, DULoxetine (CYMBALTA) 60 MG capsule  Insomnia, unspecified type - Plan: hydrOXYzine (ATARAX/VISTARIL) 10 MG tablet  Please see After Visit Summary for patient specific instructions.  Future Appointments  Date Time Provider [place]  03/20/2019  9:00 AM Cottle, Lucious Groves, LCSW [facility] None  04/03/2019  9:00 AM Cottle, Bambi G, LCSW [facility] None  04/17/2019  9:00 AM Cottle, Bambi G, LCSW [facility] None  05/07/2019  9:00 AM Thayer Headings, PMHNP [facility] None    No orders of the defined types were placed in this encounter.     -------------------------------

## 2019-03-20 ENCOUNTER — Ambulatory Visit (INDEPENDENT_AMBULATORY_CARE_PROVIDER_SITE_OTHER): Payer: 59 | Admitting: Psychology

## 2019-03-20 DIAGNOSIS — F321 Major depressive disorder, single episode, moderate: Secondary | ICD-10-CM

## 2019-03-20 DIAGNOSIS — F4323 Adjustment disorder with mixed anxiety and depressed mood: Secondary | ICD-10-CM | POA: Diagnosis not present

## 2019-04-03 ENCOUNTER — Ambulatory Visit (INDEPENDENT_AMBULATORY_CARE_PROVIDER_SITE_OTHER): Payer: Medicare Other | Admitting: Psychology

## 2019-04-03 DIAGNOSIS — F411 Generalized anxiety disorder: Secondary | ICD-10-CM | POA: Diagnosis not present

## 2019-04-03 DIAGNOSIS — F4323 Adjustment disorder with mixed anxiety and depressed mood: Secondary | ICD-10-CM | POA: Diagnosis not present

## 2019-04-03 DIAGNOSIS — F321 Major depressive disorder, single episode, moderate: Secondary | ICD-10-CM

## 2019-04-17 ENCOUNTER — Ambulatory Visit (INDEPENDENT_AMBULATORY_CARE_PROVIDER_SITE_OTHER): Payer: Medicare Other | Admitting: Psychology

## 2019-04-17 DIAGNOSIS — F411 Generalized anxiety disorder: Secondary | ICD-10-CM

## 2019-04-17 DIAGNOSIS — F4323 Adjustment disorder with mixed anxiety and depressed mood: Secondary | ICD-10-CM

## 2019-04-17 DIAGNOSIS — F321 Major depressive disorder, single episode, moderate: Secondary | ICD-10-CM

## 2019-05-01 ENCOUNTER — Ambulatory Visit (INDEPENDENT_AMBULATORY_CARE_PROVIDER_SITE_OTHER): Payer: Medicare Other | Admitting: Psychology

## 2019-05-01 DIAGNOSIS — F321 Major depressive disorder, single episode, moderate: Secondary | ICD-10-CM

## 2019-05-01 DIAGNOSIS — F4323 Adjustment disorder with mixed anxiety and depressed mood: Secondary | ICD-10-CM

## 2019-05-01 DIAGNOSIS — F411 Generalized anxiety disorder: Secondary | ICD-10-CM | POA: Diagnosis not present

## 2019-05-07 ENCOUNTER — Other Ambulatory Visit: Payer: Self-pay

## 2019-05-07 ENCOUNTER — Encounter: Payer: Self-pay | Admitting: Psychiatry

## 2019-05-07 ENCOUNTER — Ambulatory Visit (INDEPENDENT_AMBULATORY_CARE_PROVIDER_SITE_OTHER): Payer: Medicare Other | Admitting: Psychiatry

## 2019-05-07 DIAGNOSIS — G4700 Insomnia, unspecified: Secondary | ICD-10-CM

## 2019-05-07 DIAGNOSIS — F411 Generalized anxiety disorder: Secondary | ICD-10-CM

## 2019-05-07 DIAGNOSIS — F3341 Major depressive disorder, recurrent, in partial remission: Secondary | ICD-10-CM | POA: Diagnosis not present

## 2019-05-07 MED ORDER — DULOXETINE HCL 60 MG PO CPEP: 60.0000 mg | ORAL_CAPSULE | Freq: Every day | ORAL | 1 refills | Status: DC

## 2019-05-07 MED ORDER — DULOXETINE HCL 20 MG PO CPEP: 20.0000 mg | ORAL_CAPSULE | Freq: Every day | ORAL | 1 refills | Status: DC

## 2019-05-07 MED ORDER — BUSPIRONE HCL 30 MG PO TABS: 30.0000 mg | ORAL_TABLET | Freq: Two times a day (BID) | ORAL | 1 refills | Status: DC
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
## 2019-05-08 ENCOUNTER — Other Ambulatory Visit: Payer: Self-pay | Admitting: Psychiatry

## 2019-05-08 NOTE — Telephone Encounter (Signed)
Kristin Sparks called to report that [facility] had called her and told her that they could not process her prescriptions from [place], that they have to have the supervising Dr. Serena Croissant and info.  Please resubmit with Dr. Beryle Quant info.  It was her buspar and both cymbalta

## 2019-05-09 ENCOUNTER — Other Ambulatory Visit: Payer: Self-pay

## 2019-05-09 DIAGNOSIS — F411 Generalized anxiety disorder: Secondary | ICD-10-CM

## 2019-05-09 MED ORDER — DULOXETINE HCL 20 MG PO CPEP: 20.0000 mg | ORAL_CAPSULE | Freq: Every day | ORAL | 1 refills | Status: DC

## 2019-05-09 MED ORDER — BUSPIRONE HCL 30 MG PO TABS: 30.0000 mg | ORAL_TABLET | Freq: Two times a day (BID) | ORAL | 1 refills | Status: DC

## 2019-05-09 MED ORDER — DULOXETINE HCL 60 MG PO CPEP: 60.0000 mg | ORAL_CAPSULE | Freq: Every day | ORAL | 1 refills | Status: DC

## 2019-05-09 NOTE — Telephone Encounter (Signed)
Submitted

## 2019-05-15 ENCOUNTER — Ambulatory Visit (INDEPENDENT_AMBULATORY_CARE_PROVIDER_SITE_OTHER): Payer: Medicare Other | Admitting: Psychology

## 2019-05-15 DIAGNOSIS — F321 Major depressive disorder, single episode, moderate: Secondary | ICD-10-CM

## 2019-05-15 DIAGNOSIS — F411 Generalized anxiety disorder: Secondary | ICD-10-CM | POA: Diagnosis not present

## 2019-05-15 DIAGNOSIS — F4323 Adjustment disorder with mixed anxiety and depressed mood: Secondary | ICD-10-CM

## 2019-05-29 ENCOUNTER — Ambulatory Visit (INDEPENDENT_AMBULATORY_CARE_PROVIDER_SITE_OTHER): Payer: Medicare Other | Admitting: Psychology

## 2019-05-29 DIAGNOSIS — F321 Major depressive disorder, single episode, moderate: Secondary | ICD-10-CM | POA: Diagnosis not present

## 2019-05-29 DIAGNOSIS — F4323 Adjustment disorder with mixed anxiety and depressed mood: Secondary | ICD-10-CM | POA: Diagnosis not present

## 2019-05-29 DIAGNOSIS — F411 Generalized anxiety disorder: Secondary | ICD-10-CM

## 2019-06-12 ENCOUNTER — Ambulatory Visit (INDEPENDENT_AMBULATORY_CARE_PROVIDER_SITE_OTHER): Payer: Medicare Other | Admitting: Psychology

## 2019-06-12 DIAGNOSIS — F4323 Adjustment disorder with mixed anxiety and depressed mood: Secondary | ICD-10-CM

## 2019-06-12 DIAGNOSIS — F411 Generalized anxiety disorder: Secondary | ICD-10-CM

## 2019-06-12 DIAGNOSIS — F321 Major depressive disorder, single episode, moderate: Secondary | ICD-10-CM | POA: Diagnosis not present

## 2019-06-26 ENCOUNTER — Ambulatory Visit (INDEPENDENT_AMBULATORY_CARE_PROVIDER_SITE_OTHER): Payer: Medicare Other | Admitting: Psychology

## 2019-06-26 DIAGNOSIS — F321 Major depressive disorder, single episode, moderate: Secondary | ICD-10-CM

## 2019-06-26 DIAGNOSIS — F4320 Adjustment disorder, unspecified: Secondary | ICD-10-CM

## 2019-06-26 DIAGNOSIS — F411 Generalized anxiety disorder: Secondary | ICD-10-CM

## 2019-07-10 ENCOUNTER — Ambulatory Visit (INDEPENDENT_AMBULATORY_CARE_PROVIDER_SITE_OTHER): Payer: Medicare Other | Admitting: Psychology

## 2019-07-10 DIAGNOSIS — F321 Major depressive disorder, single episode, moderate: Secondary | ICD-10-CM

## 2019-07-10 DIAGNOSIS — F4323 Adjustment disorder with mixed anxiety and depressed mood: Secondary | ICD-10-CM | POA: Diagnosis not present

## 2019-07-10 DIAGNOSIS — F411 Generalized anxiety disorder: Secondary | ICD-10-CM

## 2019-07-24 ENCOUNTER — Ambulatory Visit (INDEPENDENT_AMBULATORY_CARE_PROVIDER_SITE_OTHER): Payer: Medicare Other | Admitting: Psychology

## 2019-07-24 DIAGNOSIS — F411 Generalized anxiety disorder: Secondary | ICD-10-CM | POA: Diagnosis not present

## 2019-07-24 DIAGNOSIS — F321 Major depressive disorder, single episode, moderate: Secondary | ICD-10-CM | POA: Diagnosis not present

## 2019-07-24 DIAGNOSIS — F4323 Adjustment disorder with mixed anxiety and depressed mood: Secondary | ICD-10-CM

## 2019-08-07 ENCOUNTER — Ambulatory Visit (INDEPENDENT_AMBULATORY_CARE_PROVIDER_SITE_OTHER): Payer: Medicare Other | Admitting: Psychology

## 2019-08-07 DIAGNOSIS — F4323 Adjustment disorder with mixed anxiety and depressed mood: Secondary | ICD-10-CM | POA: Diagnosis not present

## 2019-08-07 DIAGNOSIS — F411 Generalized anxiety disorder: Secondary | ICD-10-CM | POA: Diagnosis not present

## 2019-08-07 DIAGNOSIS — F321 Major depressive disorder, single episode, moderate: Secondary | ICD-10-CM

## 2019-08-21 ENCOUNTER — Ambulatory Visit (INDEPENDENT_AMBULATORY_CARE_PROVIDER_SITE_OTHER): Payer: Medicare Other | Admitting: Psychology

## 2019-08-21 DIAGNOSIS — F4323 Adjustment disorder with mixed anxiety and depressed mood: Secondary | ICD-10-CM

## 2019-08-21 DIAGNOSIS — F411 Generalized anxiety disorder: Secondary | ICD-10-CM

## 2019-08-21 DIAGNOSIS — F321 Major depressive disorder, single episode, moderate: Secondary | ICD-10-CM | POA: Diagnosis not present

## 2019-09-04 ENCOUNTER — Ambulatory Visit (INDEPENDENT_AMBULATORY_CARE_PROVIDER_SITE_OTHER): Payer: Medicare Other | Admitting: Psychology

## 2019-09-04 DIAGNOSIS — F411 Generalized anxiety disorder: Secondary | ICD-10-CM

## 2019-09-04 DIAGNOSIS — F321 Major depressive disorder, single episode, moderate: Secondary | ICD-10-CM

## 2019-09-04 DIAGNOSIS — F4323 Adjustment disorder with mixed anxiety and depressed mood: Secondary | ICD-10-CM

## 2019-09-18 ENCOUNTER — Ambulatory Visit (INDEPENDENT_AMBULATORY_CARE_PROVIDER_SITE_OTHER): Payer: Medicare Other | Admitting: Psychology

## 2019-09-18 DIAGNOSIS — F321 Major depressive disorder, single episode, moderate: Secondary | ICD-10-CM | POA: Diagnosis not present

## 2019-09-18 DIAGNOSIS — F411 Generalized anxiety disorder: Secondary | ICD-10-CM | POA: Diagnosis not present

## 2019-09-18 DIAGNOSIS — F4323 Adjustment disorder with mixed anxiety and depressed mood: Secondary | ICD-10-CM | POA: Diagnosis not present

## 2019-10-02 ENCOUNTER — Ambulatory Visit: Payer: Medicare Other | Admitting: Psychology

## 2019-10-03 ENCOUNTER — Ambulatory Visit (INDEPENDENT_AMBULATORY_CARE_PROVIDER_SITE_OTHER): Payer: Medicare Other | Admitting: Psychology

## 2019-10-03 DIAGNOSIS — F321 Major depressive disorder, single episode, moderate: Secondary | ICD-10-CM

## 2019-10-03 DIAGNOSIS — F4323 Adjustment disorder with mixed anxiety and depressed mood: Secondary | ICD-10-CM | POA: Diagnosis not present

## 2019-10-03 DIAGNOSIS — F411 Generalized anxiety disorder: Secondary | ICD-10-CM

## 2019-10-06 ENCOUNTER — Other Ambulatory Visit: Payer: Self-pay | Admitting: Psychiatry

## 2019-10-06 DIAGNOSIS — F411 Generalized anxiety disorder: Secondary | ICD-10-CM

## 2019-10-16 ENCOUNTER — Ambulatory Visit (INDEPENDENT_AMBULATORY_CARE_PROVIDER_SITE_OTHER): Payer: Medicare Other | Admitting: Psychology

## 2019-10-16 DIAGNOSIS — F411 Generalized anxiety disorder: Secondary | ICD-10-CM

## 2019-10-16 DIAGNOSIS — F4323 Adjustment disorder with mixed anxiety and depressed mood: Secondary | ICD-10-CM

## 2019-10-16 DIAGNOSIS — F321 Major depressive disorder, single episode, moderate: Secondary | ICD-10-CM | POA: Diagnosis not present

## 2019-10-30 ENCOUNTER — Ambulatory Visit (INDEPENDENT_AMBULATORY_CARE_PROVIDER_SITE_OTHER): Payer: Medicare Other | Admitting: Psychology

## 2019-10-30 DIAGNOSIS — F411 Generalized anxiety disorder: Secondary | ICD-10-CM | POA: Diagnosis not present

## 2019-10-30 DIAGNOSIS — F4323 Adjustment disorder with mixed anxiety and depressed mood: Secondary | ICD-10-CM

## 2019-10-30 DIAGNOSIS — F321 Major depressive disorder, single episode, moderate: Secondary | ICD-10-CM

## 2019-11-07 ENCOUNTER — Ambulatory Visit (INDEPENDENT_AMBULATORY_CARE_PROVIDER_SITE_OTHER): Payer: Medicare Other | Admitting: Psychiatry

## 2019-11-07 ENCOUNTER — Other Ambulatory Visit: Payer: Self-pay

## 2019-11-07 ENCOUNTER — Encounter: Payer: Self-pay | Admitting: Psychiatry

## 2019-11-07 VITALS — BP 112/72 | HR 54

## 2019-11-07 DIAGNOSIS — F3341 Major depressive disorder, recurrent, in partial remission: Secondary | ICD-10-CM

## 2019-11-07 DIAGNOSIS — G4700 Insomnia, unspecified: Secondary | ICD-10-CM

## 2019-11-07 DIAGNOSIS — F411 Generalized anxiety disorder: Secondary | ICD-10-CM

## 2019-11-07 MED ORDER — BUSPIRONE HCL 30 MG PO TABS: 30.0000 mg | ORAL_TABLET | Freq: Two times a day (BID) | ORAL | 1 refills | Status: DC

## 2019-11-07 NOTE — Progress Notes (Signed)
Kristin Sparks [id number] 02-03-1956 64 y.o.  Subjective:   Patient ID:  Kristin Sparks is a 64 y.o. (DOB 20-Jun-1956) female.  Chief Complaint:  Chief Complaint  Patient presents with  . Follow-up    Medication Managment  . Anxiety    Medication Managment  . Medication Problem    fatigue and concentration difficulties     HPI Kristin Sparks presents to the office today for follow-up of depression and anxiety.  Brother died of COVID in 31-Oct-2022. They had a memorial in [facility]. Reports that this was "a shock" since brother had been healthy. Placed husband in respite care in November and has been extending this and he has now been there 3 months. Has talked to husband on the phone and has met him at his doctor's apts, and otherwise has not been able to have contact with him. Sister's husband died. Reports that she has had some excessive guilt related to husband being placed. She reports that her energy has been lower. She reports that she no longer feels the need to stay constantly busy. She reports that she feels tired and "spacy" with decreased concentration after taking morning medications for a few hours and it does not improve until mid-day. Energy is ok in the afternoon. She reports that her mood has been ok, considering circumstances. She reports that her anxiety is better overall. Continues to have some exaggerated startle response. Sleep has been adequate overall with occ sleep disturbance. Appetite has been ok and reports that she has been eating more junk food in the last month. Denies anhedonia. Has started to get back into running and walking. Denies SI.   Son is in [place] school.   Past medication trials: Sertraline-diarrhea. Felt calmer. Lexapro-prescribed for hot flashes. Had increased anxiety while taking. Effexor XR-some benefit and then was less effective. Signs and symptoms inadequately controlled at 75 mg and unable to tolerate higher  doses. Cymbalta Buspar Propanolol Ativan Hydroxyzine Lyrica Gabapentin-did not help with neuropathy   Review of Systems:  Review of Systems  Musculoskeletal: Negative for gait problem.  Neurological: Negative for tremors and headaches.  Psychiatric/Behavioral:       Please refer to HPI    Medications: I have reviewed the patient's current medications.  Current Outpatient Medications  Medication Sig Dispense Refill  . acetaminophen (TYLENOL) 325 MG tablet Take 650 mg by mouth every 6 (six) hours as needed.    Marland Kitchen albuterol (PROVENTIL HFA;VENTOLIN HFA) 108 (90 Base) MCG/ACT inhaler Inhale into the lungs.    . busPIRone (BUSPAR) 30 MG tablet Take 1 tablet (30 mg total) by mouth 2 (two) times daily. 180 tablet 1  . calcium-vitamin D (SM CALCIUM 500/VITAMIN D3) 500-400 MG-UNIT tablet Take by mouth.    . DULoxetine (CYMBALTA) 20 MG capsule TAKE 1 CAPSULE BY MOUTH  DAILY , WITH A 60 MG  CAPSULE TO EQUAL 80 MG  TOTAL DOSE 90 capsule 3  . DULoxetine (CYMBALTA) 60 MG capsule TAKE 1 CAPSULE BY MOUTH  DAILY , WITH A 20 MG  CAPSULE TO EQUAL 80 MG  DAILY 90 capsule 3  . Fluticasone-Salmeterol (ADVAIR DISKUS) 250-50 MCG/DOSE AEPB Inhale into the lungs.    . hydrOXYzine (ATARAX/VISTARIL) 10 MG tablet Take 1 tablet (10 mg total) by mouth at bedtime as needed for up to 30 days. 30 tablet 2  . levocetirizine (XYZAL) 5 MG tablet Take 5 mg by mouth daily as needed.     . lovastatin (MEVACOR) 20 MG tablet Take 20  mg by mouth once a week.     . meloxicam (MOBIC) 7.5 MG tablet Take 7.5 mg by mouth daily as needed.     . Omega-3 1000 MG CAPS Take by mouth.    . pregabalin (LYRICA) 75 MG capsule Take 1 capsule (75 mg total) by mouth every evening. 90 capsule 1   No current facility-administered medications for this visit.    Medication Side Effects: Fatigue and Other: Diminished concentration.   Allergies:  Allergies  Allergen Reactions  . Latex   . Sulfa Antibiotics Rash    Past Medical  History:  Diagnosis Date  . Allergic rhinitis   . Asthma   . Elevated cholesterol   . Neuropathy     Family History  Problem Relation Age of Onset  . Alcohol abuse Father   . Depression Sister     Social History   Socioeconomic History  . Marital status: Married    Spouse name: Not on file  . Number of children: Not on file  . Years of education: Not on file  . Highest education level: Not on file  Occupational History  . Not on file  Tobacco Use  . Smoking status: Never Smoker  . Smokeless tobacco: Never Used  Substance and Sexual Activity  . Alcohol use: Not on file  . Drug use: Not on file  . Sexual activity: Not on file  Other Topics Concern  . Not on file  Social History Narrative  . Not on file   Social Determinants of Health   Financial Resource Strain:   . Difficulty of Paying Living Expenses: Not on file  Food Insecurity:   . Worried About Charity fundraiser in the Last Year: Not on file  . Ran Out of Food in the Last Year: Not on file  Transportation Needs:   . Lack of Transportation (Medical): Not on file  . Lack of Transportation (Non-Medical): Not on file  Physical Activity:   . Days of Exercise per Week: Not on file  . Minutes of Exercise per Session: Not on file  Stress:   . Feeling of Stress : Not on file  Social Connections:   . Frequency of Communication with Friends and Family: Not on file  . Frequency of Social Gatherings with Friends and Family: Not on file  . Attends Religious Services: Not on file  . Active Member of Clubs or Organizations: Not on file  . Attends Archivist Meetings: Not on file  . Marital Status: Not on file  Intimate Partner Violence:   . Fear of Current or Ex-Partner: Not on file  . Emotionally Abused: Not on file  . Physically Abused: Not on file  . Sexually Abused: Not on file    Past Medical History, Surgical history, Social history, and Family history were reviewed and updated as appropriate.    Please see review of systems for further details on the patient's review from today.   Objective:   Physical Exam:  BP 112/72   Pulse (!) 54   Physical Exam Constitutional:      General: She is not in acute distress.    Appearance: She is well-developed.  Musculoskeletal:        General: No deformity.  Neurological:     Mental Status: She is alert and oriented to person, place, and time.     Coordination: Coordination normal.  Psychiatric:        Attention and Perception: Attention and perception normal. She  does not perceive auditory or visual hallucinations.        Mood and Affect: Mood normal. Mood is not anxious or depressed. Affect is not labile, blunt, angry or inappropriate.        Speech: Speech normal.        Behavior: Behavior normal.        Thought Content: Thought content normal. Thought content is not paranoid or delusional. Thought content does not include homicidal or suicidal ideation. Thought content does not include homicidal or suicidal plan.        Cognition and Memory: Cognition and memory normal.        Judgment: Judgment normal.     Comments: Insight intact     Lab Review:  No results found for: NA, K, CL, CO2, GLUCOSE, BUN, CREATININE, CALCIUM, PROT, ALBUMIN, AST, ALT, ALKPHOS, BILITOT, GFRNONAA, GFRAA  No results found for: WBC, RBC, HGB, HCT, PLT, MCV, MCH, MCHC, RDW, LYMPHSABS, MONOABS, EOSABS, BASOSABS  No results found for: POCLITH, LITHIUM   No results found for: PHENYTOIN, PHENOBARB, VALPROATE, CBMZ   .res Assessment: Plan:   Pt seen for 30 minutes and discussed changing administration time of Cymbalta to HS to minimize side effects since possible side effects occur for a few hours after taking Cymbalta. Discussed possible dose reduction of Cymbalta to 60 mg po qd if side effects do not fully improve. Continue Buspar 30 mg po BID for anxiety.  Recommend continuing therapy with Bambi Cottle, LCSW. Pt to f/u in 4 weeks or sooner if  clinically indicated.  Patient advised to contact office with any questions, adverse effects, or acute worsening in signs and symptoms.  Kristin Sparks was seen today for follow-up, anxiety and medication problem.  Diagnoses and all orders for this visit:  Generalized anxiety disorder -     busPIRone (BUSPAR) 30 MG tablet; Take 1 tablet (30 mg total) by mouth 2 (two) times daily.  Depression, major, recurrent, in partial remission ([place])  Insomnia, unspecified type     Please see After Visit Summary for patient specific instructions.  Future Appointments  Date Time Provider [place]  11/13/2019  9:00 AM Cottle, Lucious Groves, LCSW [facility] None  12/05/2019  9:30 AM Thayer Headings, PMHNP [facility] None    No orders of the defined types were placed in this encounter.   -------------------------------

## 2019-11-13 ENCOUNTER — Ambulatory Visit (INDEPENDENT_AMBULATORY_CARE_PROVIDER_SITE_OTHER): Payer: Medicare Other | Admitting: Psychology

## 2019-11-13 DIAGNOSIS — F4323 Adjustment disorder with mixed anxiety and depressed mood: Secondary | ICD-10-CM

## 2019-11-13 DIAGNOSIS — F411 Generalized anxiety disorder: Secondary | ICD-10-CM

## 2019-11-13 DIAGNOSIS — F321 Major depressive disorder, single episode, moderate: Secondary | ICD-10-CM

## 2019-12-05 ENCOUNTER — Ambulatory Visit (INDEPENDENT_AMBULATORY_CARE_PROVIDER_SITE_OTHER): Payer: Medicare Other | Admitting: Psychiatry

## 2019-12-05 ENCOUNTER — Encounter: Payer: Self-pay | Admitting: Psychiatry

## 2019-12-05 DIAGNOSIS — F411 Generalized anxiety disorder: Secondary | ICD-10-CM

## 2019-12-05 DIAGNOSIS — F3341 Major depressive disorder, recurrent, in partial remission: Secondary | ICD-10-CM | POA: Diagnosis not present

## 2019-12-05 DIAGNOSIS — G4700 Insomnia, unspecified: Secondary | ICD-10-CM | POA: Diagnosis not present

## 2019-12-05 NOTE — Progress Notes (Signed)
ARAH BARO [id number] 11/09/1955 64 y.o.  Virtual Visit via Video Note  I connected with pt @ on 12/05/19 at  9:30 AM EST by a video enabled telemedicine application and verified that I am speaking with the correct person using two identifiers.   I discussed the limitations of evaluation and management by telemedicine and the availability of in person appointments. The patient expressed understanding and agreed to proceed.  I discussed the assessment and treatment plan with the patient. The patient was provided an opportunity to ask questions and all were answered. The patient agreed with the plan and demonstrated an understanding of the instructions.   The patient was advised to call back or seek an in-person evaluation if the symptoms worsen or if the condition fails to improve as anticipated.  I provided 30 minutes of non-face-to-face time during this encounter.  The patient was located at home.  The provider was located at [place].   Thayer Headings, PMHNP   Subjective:   Patient ID:  Kristin Sparks is a 64 y.o. (DOB 1956/07/19) female.  Chief Complaint:  Chief Complaint  Patient presents with  . Follow-up    Anxiety and Depression    HPI DENI CICCIO presents for follow-up of Lorenz Coaster and depression. She reports that she is not as drowsy during the day since she moved Cymbalta to HS. No longer drowsy in the morning. She reports that overall her sleep is "about the same." She reports that some nights it takes an hour to fall asleep and other nights she falls asleep without difficulty. Has occ days of depression and anxiety. She reports periods of sadness when she is by herself. She is hoping to get out more with the weather turning warmer. She reports that she continues to experience excessive guilt about placing her husband in a LTC facility. She reports some occ rumination about this. She reports that she is working with her therapist about this. She also  experiences some guilt if she is doing something enjoyable. She reports that her motivation has been good and improved since weather has been warmer. She has been selling her furniture and buying new furniture and doing some renovations. Appetite has been good. Concentration is fair. She reports that she has difficulty sitting down to read and feels guilty about this. Had plan to read several books. Denies SI.   Has had both covid vaccinations. Going to the beach in April with family. Has only been able to see her husband at [place] appointments. Has been running again and training for a 5K.   Past medication trials: Sertraline-diarrhea. Felt calmer. Lexapro-prescribed for hot flashes. Had increased anxiety while taking. Effexor XR-some benefit and then was less effective. Signs and symptoms inadequately controlled at 75 mg and unable to tolerate higher doses. Cymbalta Buspar Propanolol Ativan Hydroxyzine Lyrica Gabapentin-did not help with neuropathy  Review of Systems:  Review of Systems  Gastrointestinal: Positive for abdominal pain. Negative for constipation, diarrhea and nausea.  Musculoskeletal: Negative for gait problem.  Neurological: Negative for tremors.  Psychiatric/Behavioral:       Please refer to HPI    Medications: I have reviewed the patient's current medications.  Current Outpatient Medications  Medication Sig Dispense Refill  . busPIRone (BUSPAR) 30 MG tablet Take 1 tablet (30 mg total) by mouth 2 (two) times daily. 180 tablet 1  . calcium-vitamin D (SM CALCIUM 500/VITAMIN D3) 500-400 MG-UNIT tablet Take by mouth.    . DULoxetine (CYMBALTA) 20 MG capsule TAKE  1 CAPSULE BY MOUTH  DAILY , WITH A 60 MG  CAPSULE TO EQUAL 80 MG  TOTAL DOSE 90 capsule 3  . DULoxetine (CYMBALTA) 60 MG capsule TAKE 1 CAPSULE BY MOUTH  DAILY , WITH A 20 MG  CAPSULE TO EQUAL 80 MG  DAILY 90 capsule 3  . levocetirizine (XYZAL) 5 MG tablet Take 5 mg by mouth daily as needed.     .  lovastatin (MEVACOR) 20 MG tablet Take 20 mg by mouth once a week.     . meloxicam (MOBIC) 7.5 MG tablet Take 7.5 mg by mouth daily as needed.     . Omega-3 1000 MG CAPS Take by mouth.    Marland Kitchen acetaminophen (TYLENOL) 325 MG tablet Take 650 mg by mouth every 6 (six) hours as needed.    Marland Kitchen albuterol (PROVENTIL HFA;VENTOLIN HFA) 108 (90 Base) MCG/ACT inhaler Inhale into the lungs.    . Fluticasone-Salmeterol (ADVAIR DISKUS) 250-50 MCG/DOSE AEPB Inhale into the lungs.    . hydrOXYzine (ATARAX/VISTARIL) 10 MG tablet Take 1 tablet (10 mg total) by mouth at bedtime as needed for up to 30 days. 30 tablet 2  . pregabalin (LYRICA) 75 MG capsule Take 1 capsule (75 mg total) by mouth every evening. 90 capsule 1   No current facility-administered medications for this visit.    Medication Side Effects: Abdominal Pain  Allergies:  Allergies  Allergen Reactions  . Latex   . Sulfa Antibiotics Rash    Past Medical History:  Diagnosis Date  . Allergic rhinitis   . Asthma   . Elevated cholesterol   . Neuropathy     Family History  Problem Relation Age of Onset  . Alcohol abuse Father   . Depression Sister     Social History   Socioeconomic History  . Marital status: Married    Spouse name: Not on file  . Number of children: Not on file  . Years of education: Not on file  . Highest education level: Not on file  Occupational History  . Not on file  Tobacco Use  . Smoking status: Never Smoker  . Smokeless tobacco: Never Used  Substance and Sexual Activity  . Alcohol use: Not on file  . Drug use: Not on file  . Sexual activity: Not on file  Other Topics Concern  . Not on file  Social History Narrative  . Not on file   Social Determinants of Health   Financial Resource Strain:   . Difficulty of Paying Living Expenses: Not on file  Food Insecurity:   . Worried About Charity fundraiser in the Last Year: Not on file  . Ran Out of Food in the Last Year: Not on file  Transportation  Needs:   . Lack of Transportation (Medical): Not on file  . Lack of Transportation (Non-Medical): Not on file  Physical Activity:   . Days of Exercise per Week: Not on file  . Minutes of Exercise per Session: Not on file  Stress:   . Feeling of Stress : Not on file  Social Connections:   . Frequency of Communication with Friends and Family: Not on file  . Frequency of Social Gatherings with Friends and Family: Not on file  . Attends Religious Services: Not on file  . Active Member of Clubs or Organizations: Not on file  . Attends Archivist Meetings: Not on file  . Marital Status: Not on file  Intimate Partner Violence:   . Fear of Current  or Ex-Partner: Not on file  . Emotionally Abused: Not on file  . Physically Abused: Not on file  . Sexually Abused: Not on file    Past Medical History, Surgical history, Social history, and Family history were reviewed and updated as appropriate.   Please see review of systems for further details on the patient's review from today.   Objective:   Physical Exam:  There were no vitals taken for this visit.  Physical Exam Constitutional:      General: She is not in acute distress. Musculoskeletal:        General: No deformity.  Neurological:     Mental Status: She is alert and oriented to person, place, and time.     Coordination: Coordination normal.  Psychiatric:        Attention and Perception: Attention and perception normal. She does not perceive auditory or visual hallucinations.        Mood and Affect: Mood is not depressed. Affect is not labile, blunt, angry or inappropriate.        Speech: Speech normal.        Behavior: Behavior normal.        Thought Content: Thought content normal. Thought content is not paranoid or delusional. Thought content does not include homicidal or suicidal ideation. Thought content does not include homicidal or suicidal plan.        Cognition and Memory: Cognition and memory normal.         Judgment: Judgment normal.     Comments: Insight intact Anxious when discussing guilt     Lab Review:  No results found for: NA, K, CL, CO2, GLUCOSE, BUN, CREATININE, CALCIUM, PROT, ALBUMIN, AST, ALT, ALKPHOS, BILITOT, GFRNONAA, GFRAA  No results found for: WBC, RBC, HGB, HCT, PLT, MCV, MCH, MCHC, RDW, LYMPHSABS, MONOABS, EOSABS, BASOSABS  No results found for: POCLITH, LITHIUM   No results found for: PHENYTOIN, PHENOBARB, VALPROATE, CBMZ   .res Assessment: Plan:   Discussed option of continuing current dose of Cymbalta or decreasing to 60 mg daily.  Patient reports that she would like to continue current dose since she continues to experience significant feelings of guilt at times and is working with her therapist on this.  Discussed considering dose reduction in the future when this seems to have improved.  Discussed that patient may wish to consider trial of Cymbalta 20 mg in the morning and 60 mg at suppertime to determine if divided doses may possibly help with suspected side effect of abdominal cramping.  Discussed that she could resume taking 80 mg at suppertime if no improvement in side effects. Recommend continue with psychotherapy with Bambi Cottle, LCSW. Patient to follow-up with this provider in 3 months or sooner if clinically indicated. Patient advised to contact office with any questions, adverse effects, or acute worsening in signs and symptoms.  Aylinn was seen today for follow-up.  Diagnoses and all orders for this visit:  Generalized anxiety disorder  Depression, major, recurrent, in partial remission ([place])  Insomnia, unspecified type     Please see After Visit Summary for patient specific instructions.  Future Appointments  Date Time Provider [place]  12/11/2019  9:00 AM Cottle, Lucious Groves, LCSW [facility] None  12/25/2019  9:00 AM Cottle, Bambi G, LCSW [facility] None  01/08/2020  9:00 AM Cottle, Bambi G, LCSW [facility] None    No orders of the defined  types were placed in this encounter.     -------------------------------

## 2019-12-11 ENCOUNTER — Ambulatory Visit (INDEPENDENT_AMBULATORY_CARE_PROVIDER_SITE_OTHER): Payer: Medicare Other | Admitting: Psychology

## 2019-12-11 DIAGNOSIS — F411 Generalized anxiety disorder: Secondary | ICD-10-CM

## 2019-12-11 DIAGNOSIS — F321 Major depressive disorder, single episode, moderate: Secondary | ICD-10-CM

## 2019-12-11 DIAGNOSIS — F4323 Adjustment disorder with mixed anxiety and depressed mood: Secondary | ICD-10-CM | POA: Diagnosis not present

## 2019-12-25 ENCOUNTER — Ambulatory Visit (INDEPENDENT_AMBULATORY_CARE_PROVIDER_SITE_OTHER): Payer: Medicare Other | Admitting: Psychology

## 2019-12-25 DIAGNOSIS — F321 Major depressive disorder, single episode, moderate: Secondary | ICD-10-CM | POA: Diagnosis not present

## 2019-12-25 DIAGNOSIS — F4323 Adjustment disorder with mixed anxiety and depressed mood: Secondary | ICD-10-CM | POA: Diagnosis not present

## 2019-12-25 DIAGNOSIS — F411 Generalized anxiety disorder: Secondary | ICD-10-CM | POA: Diagnosis not present

## 2020-01-08 ENCOUNTER — Ambulatory Visit: Payer: Medicare Other | Admitting: Psychology

## 2020-01-22 ENCOUNTER — Ambulatory Visit (INDEPENDENT_AMBULATORY_CARE_PROVIDER_SITE_OTHER): Payer: Medicare Other | Admitting: Psychology

## 2020-01-22 DIAGNOSIS — F411 Generalized anxiety disorder: Secondary | ICD-10-CM | POA: Diagnosis not present

## 2020-01-22 DIAGNOSIS — F4323 Adjustment disorder with mixed anxiety and depressed mood: Secondary | ICD-10-CM

## 2020-01-22 DIAGNOSIS — F321 Major depressive disorder, single episode, moderate: Secondary | ICD-10-CM | POA: Diagnosis not present

## 2020-02-05 ENCOUNTER — Ambulatory Visit (INDEPENDENT_AMBULATORY_CARE_PROVIDER_SITE_OTHER): Payer: Medicare Other | Admitting: Psychology

## 2020-02-05 DIAGNOSIS — F411 Generalized anxiety disorder: Secondary | ICD-10-CM

## 2020-02-05 DIAGNOSIS — F321 Major depressive disorder, single episode, moderate: Secondary | ICD-10-CM

## 2020-02-05 DIAGNOSIS — F4323 Adjustment disorder with mixed anxiety and depressed mood: Secondary | ICD-10-CM | POA: Diagnosis not present

## 2020-02-19 ENCOUNTER — Ambulatory Visit (INDEPENDENT_AMBULATORY_CARE_PROVIDER_SITE_OTHER): Payer: Medicare Other | Admitting: Psychology

## 2020-02-19 DIAGNOSIS — F4323 Adjustment disorder with mixed anxiety and depressed mood: Secondary | ICD-10-CM | POA: Diagnosis not present

## 2020-02-19 DIAGNOSIS — F321 Major depressive disorder, single episode, moderate: Secondary | ICD-10-CM

## 2020-02-19 DIAGNOSIS — F411 Generalized anxiety disorder: Secondary | ICD-10-CM | POA: Diagnosis not present

## 2020-03-04 ENCOUNTER — Ambulatory Visit (INDEPENDENT_AMBULATORY_CARE_PROVIDER_SITE_OTHER): Payer: Medicare Other | Admitting: Psychology

## 2020-03-04 DIAGNOSIS — F411 Generalized anxiety disorder: Secondary | ICD-10-CM | POA: Diagnosis not present

## 2020-03-04 DIAGNOSIS — F321 Major depressive disorder, single episode, moderate: Secondary | ICD-10-CM

## 2020-03-05 ENCOUNTER — Telehealth: Payer: Self-pay | Admitting: Psychiatry

## 2020-03-05 ENCOUNTER — Encounter: Payer: Self-pay | Admitting: Psychiatry

## 2020-03-05 ENCOUNTER — Telehealth (INDEPENDENT_AMBULATORY_CARE_PROVIDER_SITE_OTHER): Payer: Medicare Other | Admitting: Psychiatry

## 2020-03-05 DIAGNOSIS — F411 Generalized anxiety disorder: Secondary | ICD-10-CM

## 2020-03-05 DIAGNOSIS — F331 Major depressive disorder, recurrent, moderate: Secondary | ICD-10-CM

## 2020-03-05 MED ORDER — BUSPIRONE HCL 30 MG PO TABS: 30.0000 mg | ORAL_TABLET | Freq: Two times a day (BID) | ORAL | 1 refills | Status: DC

## 2020-03-05 NOTE — Telephone Encounter (Signed)
[name], harl are scheduled for a virtual visit with your provider today.    Just as we do with appointments in the office, we must obtain your consent to participate.  Your consent will be active for this visit and any virtual visit you may have with one of our providers in the next 365 days.    If you have a MyChart account, I can also send a copy of this consent to you electronically.  All virtual visits are billed to your insurance company just like a traditional visit in the office.  As this is a virtual visit, video technology does not allow for your provider to perform a traditional examination.  This may limit your provider's ability to fully assess your condition.  If your provider identifies any concerns that need to be evaluated in person or the need to arrange testing such as labs, EKG, etc, we will make arrangements to do so.    Although advances in technology are sophisticated, we cannot ensure that it will always work on either your end or our end.  If the connection with a video visit is poor, we may have to switch to a telephone visit.  With either a video or telephone visit, we are not always able to ensure that we have a secure connection.   I need to obtain your verbal consent now.   Are you willing to proceed with your visit today?   Kristin Sparks has provided verbal consent on 03/05/2020 for a virtual visit (video or telephone).   Thayer Headings, PMHNP 03/05/2020  10:19 AM

## 2020-03-05 NOTE — Progress Notes (Signed)
Kristin Sparks [id number] [birth year] 64 y.o.  Virtual Visit via Video Note  I connected with pt @ on 03/05/20 at 10:00 AM EDT by a video enabled telemedicine application and verified that I am speaking with the correct person using two identifiers.   I discussed the limitations of evaluation and management by telemedicine and the availability of in person appointments. The patient expressed understanding and agreed to proceed.  I discussed the assessment and treatment plan with the patient. The patient was provided an opportunity to ask questions and all were answered. The patient agreed with the plan and demonstrated an understanding of the instructions.   The patient was advised to call back or seek an in-person evaluation if the symptoms worsen or if the condition fails to improve as anticipated.  I provided 30 minutes of non-face-to-face time during this encounter.  The patient was located at home.  The provider was located at [place].   Thayer Headings, PMHNP   Subjective:   Patient ID:  Kristin Sparks is a 64 y.o. (DOB 02/10/1956) female.  Chief Complaint:  Chief Complaint  Patient presents with  . Anxiety  . Depression    HPI Kristin Sparks presents for follow-up of anxiety, depression, and sleep disturbance. She fractured her right wrist and is left handed. Husband died 17-Jan-2020. Husband had been in memory care since November. She reporst that husband's health declined rapidly. Has been dealing with grief and shock. She reports that 2 weeks after his death she decided to lower her dose of Cymbalta from 80 mg to 60 mg. She notices some occasional anxiety and worry. She is now having some worry about other family members. Describes anxiety as episodic. She reports that she has had 1-2 episodes of feeling like something bad is going to happen. Denies any panic s/s. Reports having more difficulty starting to relax. She denies any significant worsening in mood aside from  grief. She reports that she has "good and bad days with sleep." Occasionally taking 2 hours to fall asleep and other nights sleeps 8 hours. Appetite has been "about the same." Energy and motivation has been somewhat lower. Reports that her wrist fracture has limited activity. She reports that concentration has been about the same. She reports that she has been able to take care of husband's final affairs. Denies SI.   Had some nausea and dizziness when she first decreased the dose.   Son has been visiting periodically. Had family stay after the funeral.   She has been seeing Bambi Cottle, LCSW.   Review of Systems:  Review of Systems  Gastrointestinal: Negative.   Musculoskeletal: Negative for gait problem.       Recent wrist fracture  Neurological: Negative for tremors.  Psychiatric/Behavioral:       Please refer to HPI    Medications: I have reviewed the patient's current medications.  Current Outpatient Medications  Medication Sig Dispense Refill  . acetaminophen (TYLENOL) 325 MG tablet Take 650 mg by mouth every 6 (six) hours as needed.    Marland Kitchen albuterol (PROVENTIL HFA;VENTOLIN HFA) 108 (90 Base) MCG/ACT inhaler Inhale into the lungs.    . busPIRone (BUSPAR) 30 MG tablet Take 1 tablet (30 mg total) by mouth 2 (two) times daily. 180 tablet 1  . calcium-vitamin D (SM CALCIUM 500/VITAMIN D3) 500-400 MG-UNIT tablet Take by mouth.    . DULoxetine (CYMBALTA) 60 MG capsule TAKE 1 CAPSULE BY MOUTH  DAILY , WITH A 20 MG  CAPSULE TO EQUAL  80 MG  DAILY 90 capsule 3  . Fluticasone-Salmeterol (ADVAIR DISKUS) 250-50 MCG/DOSE AEPB Inhale into the lungs.    . hydrOXYzine (ATARAX/VISTARIL) 10 MG tablet Take 1 tablet (10 mg total) by mouth at bedtime as needed for up to 30 days. 30 tablet 2  . levocetirizine (XYZAL) 5 MG tablet Take 5 mg by mouth daily as needed.     . lovastatin (MEVACOR) 20 MG tablet Take 20 mg by mouth once a week.     . Omega-3 1000 MG CAPS Take by mouth.    . pregabalin (LYRICA)  75 MG capsule Take 1 capsule (75 mg total) by mouth every evening. 90 capsule 1  . DULoxetine (CYMBALTA) 20 MG capsule TAKE 1 CAPSULE BY MOUTH  DAILY , WITH A 60 MG  CAPSULE TO EQUAL 80 MG  TOTAL DOSE (Patient not taking: Reported on 03/05/2020) 90 capsule 3  . meloxicam (MOBIC) 7.5 MG tablet Take 7.5 mg by mouth daily.     No current facility-administered medications for this visit.    Medication Side Effects: None  Allergies:  Allergies  Allergen Reactions  . Latex   . Sulfa Antibiotics Rash    Past Medical History:  Diagnosis Date  . Allergic rhinitis   . Asthma   . Elevated cholesterol   . Neuropathy     Family History  Problem Relation Age of Onset  . Alcohol abuse Father   . Depression Sister     Social History   Socioeconomic History  . Marital status: Married    Spouse name: Not on file  . Number of children: Not on file  . Years of education: Not on file  . Highest education level: Not on file  Occupational History  . Not on file  Tobacco Use  . Smoking status: Never Smoker  . Smokeless tobacco: Never Used  Substance and Sexual Activity  . Alcohol use: Not on file  . Drug use: Not on file  . Sexual activity: Not on file  Other Topics Concern  . Not on file  Social History Narrative  . Not on file   Social Determinants of Health   Financial Resource Strain:   . Difficulty of Paying Living Expenses:   Food Insecurity:   . Worried About Charity fundraiser in the Last Year:   . Arboriculturist in the Last Year:   Transportation Needs:   . Film/video editor (Medical):   Marland Kitchen Lack of Transportation (Non-Medical):   Physical Activity:   . Days of Exercise per Week:   . Minutes of Exercise per Session:   Stress:   . Feeling of Stress :   Social Connections:   . Frequency of Communication with Friends and Family:   . Frequency of Social Gatherings with Friends and Family:   . Attends Religious Services:   . Active Member of Clubs or  Organizations:   . Attends Archivist Meetings:   Marland Kitchen Marital Status:   Intimate Partner Violence:   . Fear of Current or Ex-Partner:   . Emotionally Abused:   Marland Kitchen Physically Abused:   . Sexually Abused:     Past Medical History, Surgical history, Social history, and Family history were reviewed and updated as appropriate.   Please see review of systems for further details on the patient's review from today.   Objective:   Physical Exam:  There were no vitals taken for this visit.  Physical Exam Neurological:     Mental  Status: She is alert and oriented to person, place, and time.     Cranial Nerves: No dysarthria.  Psychiatric:        Attention and Perception: Attention and perception normal.        Mood and Affect: Mood normal.        Speech: Speech normal.        Behavior: Behavior is cooperative.        Thought Content: Thought content normal. Thought content is not paranoid or delusional. Thought content does not include homicidal or suicidal ideation. Thought content does not include homicidal or suicidal plan.        Cognition and Memory: Cognition and memory normal.        Judgment: Judgment normal.     Comments: Insight intact     Lab Review:  No results found for: NA, K, CL, CO2, GLUCOSE, BUN, CREATININE, CALCIUM, PROT, ALBUMIN, AST, ALT, ALKPHOS, BILITOT, GFRNONAA, GFRAA  No results found for: WBC, RBC, HGB, HCT, PLT, MCV, MCH, MCHC, RDW, LYMPHSABS, MONOABS, EOSABS, BASOSABS  No results found for: POCLITH, LITHIUM   No results found for: PHENYTOIN, PHENOBARB, VALPROATE, CBMZ   .res Assessment: Plan:   Discussed resuming previous dose of Cymbalta 80 mg po qd to improve mood and anxiety s/s, particularly since she is currently grieving the loss of her husband, and she has noticed some increase in anxiety and lower motivation. Pt agrees to increase.  Will continue Buspar 30 mg po BID for anxiety.  Recommend continuing therapy with Bambi Cottle, LCSW.  Pt  to f/u in 3 months or sooner if clinically indicated.  Patient advised to contact office with any questions, adverse effects, or acute worsening in signs and symptoms.  Avalene was seen today for anxiety and depression.  Diagnoses and all orders for this visit:  Generalized anxiety disorder -     busPIRone (BUSPAR) 30 MG tablet; Take 1 tablet (30 mg total) by mouth 2 (two) times daily.  Major depressive disorder, recurrent episode, moderate ([place])     Please see After Visit Summary for patient specific instructions.  Future Appointments  Date Time Provider [place]  03/18/2020  9:00 AM Cottle, Lucious Groves, LCSW [facility] None  04/01/2020  9:00 AM Cottle, Bambi G, LCSW [facility] None  04/15/2020  9:00 AM Cottle, Bambi G, LCSW [facility] None  04/29/2020  9:00 AM Cottle, Bambi G, LCSW [facility] None  05/13/2020  9:00 AM Cottle, Bambi G, LCSW [facility] None  05/27/2020  9:00 AM Cottle, Bambi G, LCSW [facility] None  06/10/2020  9:00 AM Cottle, Bambi G, LCSW [facility] None    No orders of the defined types were placed in this encounter.     -------------------------------

## 2020-03-18 ENCOUNTER — Ambulatory Visit (INDEPENDENT_AMBULATORY_CARE_PROVIDER_SITE_OTHER): Payer: Medicare Other | Admitting: Psychology

## 2020-03-18 DIAGNOSIS — F4323 Adjustment disorder with mixed anxiety and depressed mood: Secondary | ICD-10-CM

## 2020-03-18 DIAGNOSIS — F321 Major depressive disorder, single episode, moderate: Secondary | ICD-10-CM

## 2020-03-18 DIAGNOSIS — F411 Generalized anxiety disorder: Secondary | ICD-10-CM | POA: Diagnosis not present

## 2020-03-24 ENCOUNTER — Other Ambulatory Visit: Payer: Self-pay

## 2020-03-24 ENCOUNTER — Encounter: Payer: Self-pay | Admitting: Occupational Therapy

## 2020-03-24 ENCOUNTER — Ambulatory Visit: Payer: Medicare Other | Attending: Orthopedic Surgery | Admitting: Occupational Therapy

## 2020-03-24 DIAGNOSIS — M25631 Stiffness of right wrist, not elsewhere classified: Secondary | ICD-10-CM | POA: Insufficient documentation

## 2020-03-24 DIAGNOSIS — M25641 Stiffness of right hand, not elsewhere classified: Secondary | ICD-10-CM | POA: Diagnosis present

## 2020-03-24 DIAGNOSIS — R600 Localized edema: Secondary | ICD-10-CM | POA: Insufficient documentation

## 2020-03-24 DIAGNOSIS — M25531 Pain in right wrist: Secondary | ICD-10-CM

## 2020-03-24 DIAGNOSIS — M6281 Muscle weakness (generalized): Secondary | ICD-10-CM | POA: Diagnosis present

## 2020-03-24 NOTE — Patient Instructions (Signed)
2-3 x day  Keep pain under 2/10 Contrast  AROM for tendon glides  opposition to all digits and sliding down 5th  10 reps AROM for wrist in all planes - AAROM RD, UD keeping palms together  And wrist flexion ,extention- T/C AAROM Pain under 2/10  10 reps Use in light activities - 1-2 lbs limited

## 2020-03-24 NOTE — Therapy (Signed)
[facility] [address], [place], [address] Phone: [phone]   Fax:  [phone]  Occupational Therapy Evaluation  Patient Details  Name: Kristin Sparks MRN: [id number] Date of Birth: [birth year] Referring Provider (OT): [place], [place]    Encounter Date: 03/24/2020   OT End of Session - 03/24/20 1245    Visit Number 1    Number of Visits 10    Date for OT Re-Evaluation 04/28/20    OT Start Time 1007    OT Stop Time 1104    OT Time Calculation (min) 57 min    Activity Tolerance Patient tolerated treatment well    Behavior During Therapy [facility] for tasks assessed/performed           Past Medical History:  Diagnosis Date  . Allergic rhinitis   . Asthma   . Elevated cholesterol   . Neuropathy     Past Surgical History:  Procedure Laterality Date  . NASAL SINUS SURGERY    . OVARIAN CYST REMOVAL      There were no vitals filed for this visit.   Subjective Assessment - 03/24/20 1235    Subjective  I can't believe I fell- I was putting on shoes on the side of the bed - it do hurt my wrist at times - shooting pain -and then when I try and move it    Pertinent History Pt fell and fracture her wrist 02/04/2020 , was casted 02/11/2020 - xray showed healing and in velcro splint - refer to OT/hand therapy on 03/19/2020    Patient Stated Goals I want to be able to use my R hand to work out , walk my dog, bike , run and just to things around my house    Currently in Pain? Yes    Pain Score 5     Pain Location Wrist    Pain Orientation Right    Pain Descriptors / Indicators Aching;Tightness;Shooting    Pain Type Acute pain    Pain Onset More than a month ago    Pain Frequency Intermittent    Aggravating Factors  when moving or trying to use my hand             [facility] OT Assessment - 03/24/20 0001      Assessment   Medical Diagnosis R distal radius fx     Referring Provider (OT) Arvella Nigh, PA     Onset  Date/Surgical Date 02/04/20    Hand Dominance Left      Precautions   Required Braces or Orthoses --   prefab wrist splint when out and about     Balance Screen   Has the patient fallen in the past 6 months Yes    How many times? 1    Has the patient had a decrease in activity level because of a fear of falling?  No    Is the patient reluctant to leave their home because of a fear of falling?  No      Home  Environment   Lives With Alone      Prior Function   Vocation Retired    Leisure walk and run, gym , bike, walk dog, house work and cookiing       AROM   Right Forearm Pronation 60 Degrees    Right Forearm Supination 90 Degrees    Right Wrist Extension 39 Degrees    Right Wrist Flexion 25 Degrees    Right  Wrist Radial Deviation 21 Degrees    Right Wrist Ulnar Deviation 12 Degrees    Left Wrist Extension 72 Degrees    Left Wrist Flexion 90 Degrees    Left Wrist Radial Deviation 24 Degrees    Left Wrist Ulnar Deviation 35 Degrees      Right Hand AROM   R Thumb Opposition to Index --   Opposition to distal fold of 5th    R Index  MCP 0-90 75 Degrees    R Index PIP 0-100 95 Degrees    R Long  MCP 0-90 80 Degrees    R Long PIP 0-100 100 Degrees    R Ring  MCP 0-90 80 Degrees    R Ring PIP 0-100 100 Degrees    R Little  MCP 0-90 85 Degrees    R Little PIP 0-100 100 Degrees                    OT Treatments/Exercises (OP) - 03/24/20 0001      RUE Fluidotherapy   Number Minutes Fluidotherapy 8 Minutes    RUE Fluidotherapy Location Wrist;Hand    Comments AROM in all planes pirior to reiview of HEP            2-3 x day  Keep pain under 2/10 Contrast  AROM for tendon glides  opposition to all digits and sliding down 5th  10 reps AROM for wrist in all planes - AAROM RD, UD keeping palms together  And wrist flexion ,extention- T/C AAROM Pain under 2/10  10 reps Use in light activities - 1-2 lbs limited         OT Education - 03/24/20 1245     Education Details findings and HEP    Person(s) Educated Patient    Methods Explanation;Demonstration;Tactile cues;Verbal cues;Handout    Comprehension Verbal cues required;Returned demonstration;Verbalized understanding            OT Short Term Goals - 03/24/20 1250      OT SHORT TERM GOAL #1   Title Pt to be independent in HEP to decrease edema and pain - to do composite fist and increase wrist AROM in all planes    Baseline no knowledge on HEP -and decrease AROM in all planes - tight and stiff digits - increase edema - pain 6/10    Time 2    Period Weeks    Status New    Target Date 04/07/20             OT Long Term Goals - 03/24/20 1251      OT LONG TERM GOAL #1   Title R wrist AROM improve to [facility] to use hand in turning doorknob, use hand in more than 75% of ADL's    Baseline decrease AROM in all planes R wrist , only using hand in 20 % of ADL's , cannot turn doorknob    Time 4    Period Weeks    Status New    Target Date 04/21/20      OT LONG TERM GOAL #2   Title R grip and prehension strength increase with more than 50% compar to L to carry more than 5 lbs, bike at gym and walk dog    Baseline no strength  yet- come out of cast last week -and in wrist splint    Time 5    Period Weeks    Status New    Target Date 04/28/20  OT LONG TERM GOAL #3   Title R wrist strength increase to more than 4+/5 to improve function on PRWHE by 20 points    Baseline No strength yet - come out of cast last week -PRWHE function at eval 39/50    Time 5    Period Weeks    Status New    Target Date 04/28/20                 Plan - 03/24/20 1246    Clinical Impression Statement Pt is 7 wks out from R distal radius fx - was in cast and now in velcro splint - pt show increase pain and swelling in hand and wrist with AROM and light use - 1-6/10 pain - and decrease composite fist and opposition - show decrease wrist AROM in all planes , and strength - limiting her functional  use of L hand in ADL's and IADl's - pt can benefit from OT services    OT Occupational Profile and History Problem Focused Assessment - Including review of records relating to presenting problem    Occupational performance deficits (Please refer to evaluation for details): ADL's;IADL's;Play;Leisure;Social Participation    Body Structure / Function / Physical Skills ADL;Edema;Decreased knowledge of precautions;Flexibility;ROM;UE functional use;Strength;Pain;IADL    Rehab Potential Good    Clinical Decision Making Limited treatment options, no task modification necessary    Comorbidities Affecting Occupational Performance: May have comorbidities impacting occupational performance    Modification or Assistance to Complete Evaluation  No modification of tasks or assist necessary to complete eval    OT Frequency 2x / week    OT Duration --   5 wks   OT Treatment/Interventions Self-care/ADL training;Patient/family education;Splinting;Paraffin;Fluidtherapy;Contrast Bath;Manual Therapy;Passive range of motion    Plan assess progress with HEP    OT Home Exercise Plan see pt instruction    Consulted and Agree with Plan of Care Patient           Patient will benefit from skilled therapeutic intervention in order to improve the following deficits and impairments:   Body Structure / Function / Physical Skills: ADL, Edema, Decreased knowledge of precautions, Flexibility, ROM, UE functional use, Strength, Pain, IADL       Visit Diagnosis: Pain in right wrist - Plan: Ot plan of care cert/re-cert  Stiffness of right wrist, not elsewhere classified - Plan: Ot plan of care cert/re-cert  Stiffness of right hand, not elsewhere classified - Plan: Ot plan of care cert/re-cert  Muscle weakness (generalized) - Plan: Ot plan of care cert/re-cert  Localized edema - Plan: Ot plan of care cert/re-cert    Problem List Patient Active Problem List   Diagnosis Date Noted  . Generalized anxiety disorder  07/12/2018  . Severe recurrent major depression without psychotic features ([place]) 07/12/2018  . Rosacea 06/08/2017  . Left knee pain 06/08/2017  . Non-seasonal allergic rhinitis due to pollen 12/09/2015  . Pure hypercholesterolemia 12/09/2015  . Idiopathic sensorimotor axonal neuropathy 06/10/2015  . Asthma 08/19/2014    Rosalyn Gess OTR/L,CLT  03/24/2020, 12:57 PM  [facility] [address], [place], [address] Phone: [phone]   Fax:  [phone]  Name: Kristin Sparks MRN: [id number] Date of Birth: 11/21/1955

## 2020-03-27 ENCOUNTER — Other Ambulatory Visit: Payer: Self-pay

## 2020-03-27 ENCOUNTER — Ambulatory Visit: Payer: Medicare Other | Attending: Orthopedic Surgery | Admitting: Occupational Therapy

## 2020-03-27 DIAGNOSIS — M6281 Muscle weakness (generalized): Secondary | ICD-10-CM | POA: Diagnosis present

## 2020-03-27 DIAGNOSIS — R600 Localized edema: Secondary | ICD-10-CM | POA: Diagnosis present

## 2020-03-27 DIAGNOSIS — M25641 Stiffness of right hand, not elsewhere classified: Secondary | ICD-10-CM | POA: Insufficient documentation

## 2020-03-27 DIAGNOSIS — M25531 Pain in right wrist: Secondary | ICD-10-CM | POA: Insufficient documentation

## 2020-03-27 DIAGNOSIS — M25631 Stiffness of right wrist, not elsewhere classified: Secondary | ICD-10-CM | POA: Insufficient documentation

## 2020-03-27 NOTE — Therapy (Signed)
[place] [facility] [address] [place], [address] Phone: [phone]   Fax:  [phone]  Occupational Therapy Treatment  Patient Details  Name: Kristin Sparks MRN: [id number] Date of Birth: November 05, 1955 Referring Provider (OT): [place], [place]    Encounter Date: 03/27/2020   OT End of Session - 03/27/20 0835    Visit Number 2    Number of Visits 10    Date for OT Re-Evaluation 04/28/20    OT Start Time 0835    OT Stop Time 0921    OT Time Calculation (min) 46 min    Activity Tolerance Patient tolerated treatment well    Behavior During Therapy [facility] for tasks assessed/performed           Past Medical History:  Diagnosis Date  . Allergic rhinitis   . Asthma   . Elevated cholesterol   . Neuropathy     Past Surgical History:  Procedure Laterality Date  . NASAL SINUS SURGERY    . OVARIAN CYST REMOVAL      There were no vitals filed for this visit.   Subjective Assessment - 03/27/20 0834    Subjective  Random shooting pain night time and during day in the wrist - still some swelling -doing exercises but it was really stiif in the am    Pertinent History Pt fell and fracture her wrist 02/04/2020 , was casted 02/11/2020 - xray showed healing and in velcro splint - refer to OT/hand therapy on 03/19/2020    Patient Stated Goals I want to be able to use my R hand to work out , walk my dog, bike , run and just to things around my house    Currently in Pain? Yes    Pain Score 5     Pain Location Wrist    Pain Orientation Right    Pain Descriptors / Indicators Aching;Shooting;Tightness    Pain Type Acute pain    Pain Frequency Intermittent              [facility] OT Assessment - 03/27/20 0001      AROM   Right Forearm Pronation 70 Degrees    Right Forearm Supination 90 Degrees    Right Wrist Extension 48 Degrees    Right Wrist Flexion 30 Degrees    Right Wrist Radial Deviation 25 Degrees    Right Wrist Ulnar  Deviation 15 Degrees          Measurement taken - did show progress - but need assistance to review HEP  Some pain with opposition - over base of thumb  Stiffness in digits Cont with edema in hand and over wrist           OT Treatments/Exercises (OP) - 03/27/20 0001      RUE Fluidotherapy   Number Minutes Fluidotherapy 8 Minutes    RUE Fluidotherapy Location Wrist;Hand    Comments AROM in fluidi for hand and wrist to decrease stiffness           soft tissue mobs done - MC spreads and webspace -graston tool nr 2 done light sweeping over volar forearrm and wrist  AAROM and AROM for thumb IP and MC flexion - composite flexion  Fitted with isotoner glove to uses night time and some during day under her splint    Keep pain under 2/10 with HEP  Contrast  To cont with at home  AROM for tendon glides and AAROM this date for [facility]  flexion   opposition to all digits and sliding down 5th - increase AROM and ease after doing thumb IP and MC flexion  Add to HEP  10 reps AROM for wrist in all planes - AAROM RD, UD keeping palms together needed min A ; sup and pronation AROM 10 reps pain on ulnar wrist  And wrist flexion ,extention- T/C AAROM needed mod A Pain under 2/10  10 reps Use in light activities - 1-2 lbs limited         OT Education - 03/27/20 0848    Education Details review HEP            OT Short Term Goals - 03/24/20 1250      OT SHORT TERM GOAL #1   Title Pt to be independent in HEP to decrease edema and pain - to do composite fist and increase wrist AROM in all planes    Baseline no knowledge on HEP -and decrease AROM in all planes - tight and stiff digits - increase edema - pain 6/10    Time 2    Period Weeks    Status New    Target Date 04/07/20             OT Long Term Goals - 03/24/20 1251      OT LONG TERM GOAL #1   Title R wrist AROM improve to [facility] to use hand in turning doorknob, use hand in more than 75% of ADL's    Baseline decrease  AROM in all planes R wrist , only using hand in 20 % of ADL's , cannot turn doorknob    Time 4    Period Weeks    Status New    Target Date 04/21/20      OT LONG TERM GOAL #2   Title R grip and prehension strength increase with more than 50% compar to L to carry more than 5 lbs, bike at gym and walk dog    Baseline no strength  yet- come out of cast last week -and in wrist splint    Time 5    Period Weeks    Status New    Target Date 04/28/20      OT LONG TERM GOAL #3   Title R wrist strength increase to more than 4+/5 to improve function on PRWHE by 20 points    Baseline No strength yet - come out of cast last week -PRWHE function at eval 39/50    Time 5    Period Weeks    Status New    Target Date 04/28/20                 Plan - 03/27/20 0848    Clinical Impression Statement Pt is 7 1/2 wks out from R distal radius fx - was in cast and now velcro splint - still some pain 5/10 at the most and randon shooting pain in wrist and base of thumb - pt show increase AROM in wrist - cont to have edema and pain - pt to cont working on [employer] - fitted with isotoner glove for edema - decreasing pain    OT Occupational Profile and History Problem Focused Assessment - Including review of records relating to presenting problem    Occupational performance deficits (Please refer to evaluation for details): ADL's;IADL's;Play;Leisure;Social Participation    Body Structure / Function / Physical Skills ADL;Edema;Decreased knowledge of precautions;Flexibility;ROM;UE functional use;Strength;Pain;IADL    Rehab Potential Good    Clinical Decision  Making Limited treatment options, no task modification necessary    Comorbidities Affecting Occupational Performance: May have comorbidities impacting occupational performance    Modification or Assistance to Complete Evaluation  No modification of tasks or assist necessary to complete eval    OT Frequency 2x / week    OT Duration --   5 wks   OT  Treatment/Interventions Self-care/ADL training;Patient/family education;Splinting;Paraffin;Fluidtherapy;Contrast Bath;Manual Therapy;Passive range of motion    Plan assess progress with HEP    OT Home Exercise Plan see pt instruction    Consulted and Agree with Plan of Care Patient           Patient will benefit from skilled therapeutic intervention in order to improve the following deficits and impairments:   Body Structure / Function / Physical Skills: ADL, Edema, Decreased knowledge of precautions, Flexibility, ROM, UE functional use, Strength, Pain, IADL       Visit Diagnosis: Stiffness of right hand, not elsewhere classified  Muscle weakness (generalized)  Localized edema  Stiffness of right wrist, not elsewhere classified  Pain in right wrist    Problem List Patient Active Problem List   Diagnosis Date Noted  . Generalized anxiety disorder 07/12/2018  . Severe recurrent major depression without psychotic features ([place]) 07/12/2018  . Rosacea 06/08/2017  . Left knee pain 06/08/2017  . Non-seasonal allergic rhinitis due to pollen 12/09/2015  . Pure hypercholesterolemia 12/09/2015  . Idiopathic sensorimotor axonal neuropathy 06/10/2015  . Asthma 08/19/2014    Rosalyn Gess OTR/L,CLT  03/27/2020, 11:16 AM  [place] [facility] [address], [place], [address] Phone: [phone]   Fax:  [phone]  Name: LILLA CALLEJO MRN: [id number] Date of Birth: 08-Jun-1956

## 2020-04-01 ENCOUNTER — Ambulatory Visit (INDEPENDENT_AMBULATORY_CARE_PROVIDER_SITE_OTHER): Payer: Medicare Other | Admitting: Psychology

## 2020-04-01 DIAGNOSIS — F4323 Adjustment disorder with mixed anxiety and depressed mood: Secondary | ICD-10-CM | POA: Diagnosis not present

## 2020-04-01 DIAGNOSIS — F411 Generalized anxiety disorder: Secondary | ICD-10-CM | POA: Diagnosis not present

## 2020-04-01 DIAGNOSIS — F321 Major depressive disorder, single episode, moderate: Secondary | ICD-10-CM | POA: Diagnosis not present

## 2020-04-02 ENCOUNTER — Other Ambulatory Visit: Payer: Self-pay

## 2020-04-02 ENCOUNTER — Ambulatory Visit: Payer: Medicare Other | Admitting: Occupational Therapy

## 2020-04-02 DIAGNOSIS — M25641 Stiffness of right hand, not elsewhere classified: Secondary | ICD-10-CM

## 2020-04-02 DIAGNOSIS — M25631 Stiffness of right wrist, not elsewhere classified: Secondary | ICD-10-CM

## 2020-04-02 DIAGNOSIS — R600 Localized edema: Secondary | ICD-10-CM

## 2020-04-02 DIAGNOSIS — M25531 Pain in right wrist: Secondary | ICD-10-CM

## 2020-04-02 DIAGNOSIS — M6281 Muscle weakness (generalized): Secondary | ICD-10-CM

## 2020-04-02 NOTE — Therapy (Signed)
[place] [facility] [address], [place], [address] Phone: [phone]   Fax:  [phone]  Occupational Therapy Treatment  Patient Details  Name: Kristin Sparks MRN: [id number] Date of Birth: Dec 14, 1955 Referring Provider (OT): [place], [place]    Encounter Date: 04/02/2020   OT End of Session - 04/02/20 1735    Visit Number 3    Number of Visits 10    Date for OT Re-Evaluation 04/28/20    OT Start Time 8101    OT Stop Time 1351    OT Time Calculation (min) 46 min    Behavior During Therapy [facility] for tasks assessed/performed           Past Medical History:  Diagnosis Date  . Allergic rhinitis   . Asthma   . Elevated cholesterol   . Neuropathy     Past Surgical History:  Procedure Laterality Date  . NASAL SINUS SURGERY    . OVARIAN CYST REMOVAL      There were no vitals filed for this visit.   Subjective Assessment - 04/02/20 1733    Subjective  I had some swelling over the back of my wrist last Friday and little more pain with my wrist exercises -and fisting - so I did not do as much exercises    Pertinent History Pt fell and fracture her wrist 02/04/2020 , was casted 02/11/2020 - xray showed healing and in velcro splint - refer to OT/hand therapy on 03/19/2020    Patient Stated Goals I want to be able to use my R hand to work out , walk my dog, bike , run and just to things around my house    Currently in Pain? Yes    Pain Score 2     Pain Orientation Right    Pain Descriptors / Indicators Aching;Tightness;Shooting    Pain Type Acute pain    Pain Onset More than a month ago    Pain Frequency Intermittent    Aggravating Factors  flexion , ext of wrist , gripping              [facility] OT Assessment - 04/02/20 0001      AROM   Right Forearm Pronation 80 Degrees    Right Wrist Extension 45 Degrees    Right Wrist Flexion 28 Degrees    Right Wrist Radial Deviation 25 Degrees    Right Wrist Ulnar  Deviation 20 Degrees           Pt show increase pain with wrist extention and flexion - and composite grip - pt appear were straining wrist extention with HEP - add this date and reinforce to work on [employer] and stretches prior to AROM and 1 lbs weight   wean out of hard splint - fitted with Benik neoprene wrist wrap to use with activities that has some pain           OT Treatments/Exercises (OP) - 04/02/20 0001      RUE Fluidotherapy   Number Minutes Fluidotherapy 8 Minutes    RUE Fluidotherapy Location Wrist;Hand    Comments AROM prior to review of new HEP            soft tissue mobs done - MC spreads and webspace -graston tool nr 2 done light sweeping over volar forearrm and wrist  AAROM and AROM for thumb IP and MC flexion - composite flexion  Cont with isotoner glove to uses night time and some  during day under her splint as needed  Keep pain under 2/10 with HEP  Contrast  To cont with at home  Cont with tendon glidess at home  And opposition to all digits and sliding down 5th - increase AROM and ease after doing thumb IP and MC flexion  10 reps Add and review this date HEP for  AAROM RD, UD and wrist flexion , ext over edge of table  Can also do prayer stretch and wrist flexion stretch over armrest  Pain less than 2 /10  2 x day   Upgrade to 1 lbs for wrist sup /pro with Benik splint  And RD, UD to side  Pain free   10 reps 2 x day  Use in light activities - 1-2 lbs limited        OT Education - 04/02/20 1735    Education Details upgrade to new HEP    Person(s) Educated Patient    Methods Explanation;Demonstration;Tactile cues;Verbal cues;Handout    Comprehension Verbal cues required;Returned demonstration;Verbalized understanding            OT Short Term Goals - 03/24/20 1250      OT SHORT TERM GOAL #1   Title Pt to be independent in HEP to decrease edema and pain - to do composite fist and increase wrist AROM in all planes    Baseline no  knowledge on HEP -and decrease AROM in all planes - tight and stiff digits - increase edema - pain 6/10    Time 2    Period Weeks    Status New    Target Date 04/07/20             OT Long Term Goals - 03/24/20 1251      OT LONG TERM GOAL #1   Title R wrist AROM improve to [facility] to use hand in turning doorknob, use hand in more than 75% of ADL's    Baseline decrease AROM in all planes R wrist , only using hand in 20 % of ADL's , cannot turn doorknob    Time 4    Period Weeks    Status New    Target Date 04/21/20      OT LONG TERM GOAL #2   Title R grip and prehension strength increase with more than 50% compar to L to carry more than 5 lbs, bike at gym and walk dog    Baseline no strength  yet- come out of cast last week -and in wrist splint    Time 5    Period Weeks    Status New    Target Date 04/28/20      OT LONG TERM GOAL #3   Title R wrist strength increase to more than 4+/5 to improve function on PRWHE by 20 points    Baseline No strength yet - come out of cast last week -PRWHE function at eval 39/50    Time 5    Period Weeks    Status New    Target Date 04/28/20                 Plan - 04/02/20 1736    Clinical Impression Statement Pt is about 8 1/2 wks out from R distal radius fx - pt this date increase pain and same AROM for wrist flexion , ext - some pain with composite fist. Pt to not wear hard splint during day , fitted with Benik soft neoprene to wear during day with actiivies -  add AAROM for wrist in all planes with pain less than 2/10 - prior to 1 lbs weight and AROM - do not strain and force wrist extention - has to work on [employer] and PROM prior to AROM and weight    OT Occupational Profile and History Problem Focused Assessment - Including review of records relating to presenting problem    Occupational performance deficits (Please refer to evaluation for details): ADL's;IADL's;Play;Leisure;Social Participation    Body Structure / Function / Physical  Skills ADL;Edema;Decreased knowledge of precautions;Flexibility;ROM;UE functional use;Strength;Pain;IADL    Rehab Potential Good    Clinical Decision Making Limited treatment options, no task modification necessary    Comorbidities Affecting Occupational Performance: May have comorbidities impacting occupational performance    Modification or Assistance to Complete Evaluation  No modification of tasks or assist necessary to complete eval    OT Frequency 2x / week    OT Duration 4 weeks    OT Treatment/Interventions Self-care/ADL training;Patient/family education;Splinting;Paraffin;Fluidtherapy;Contrast Bath;Manual Therapy;Passive range of motion    Plan assess progress with HEP    OT Home Exercise Plan see pt instruction           Patient will benefit from skilled therapeutic intervention in order to improve the following deficits and impairments:   Body Structure / Function / Physical Skills: ADL, Edema, Decreased knowledge of precautions, Flexibility, ROM, UE functional use, Strength, Pain, IADL       Visit Diagnosis: Stiffness of right hand, not elsewhere classified  Muscle weakness (generalized)  Localized edema  Stiffness of right wrist, not elsewhere classified  Pain in right wrist    Problem List Patient Active Problem List   Diagnosis Date Noted  . Generalized anxiety disorder 07/12/2018  . Severe recurrent major depression without psychotic features ([place]) 07/12/2018  . Rosacea 06/08/2017  . Left knee pain 06/08/2017  . Non-seasonal allergic rhinitis due to pollen 12/09/2015  . Pure hypercholesterolemia 12/09/2015  . Idiopathic sensorimotor axonal neuropathy 06/10/2015  . Asthma 08/19/2014    Rosalyn Gess OTR/l,CLT  04/02/2020, 5:39 PM  [place] [facility] [address] [place], [address] Phone: [phone]   Fax:  [phone]  Name: Kristin Sparks MRN: [id number] Date of Birth:  [birth year]

## 2020-04-02 NOTE — Patient Instructions (Signed)
See note

## 2020-04-04 ENCOUNTER — Ambulatory Visit: Payer: Medicare Other | Admitting: Occupational Therapy

## 2020-04-04 ENCOUNTER — Other Ambulatory Visit: Payer: Self-pay

## 2020-04-04 DIAGNOSIS — M25531 Pain in right wrist: Secondary | ICD-10-CM

## 2020-04-04 DIAGNOSIS — M25631 Stiffness of right wrist, not elsewhere classified: Secondary | ICD-10-CM

## 2020-04-04 DIAGNOSIS — R600 Localized edema: Secondary | ICD-10-CM

## 2020-04-04 DIAGNOSIS — M25641 Stiffness of right hand, not elsewhere classified: Secondary | ICD-10-CM | POA: Diagnosis not present

## 2020-04-04 DIAGNOSIS — M6281 Muscle weakness (generalized): Secondary | ICD-10-CM

## 2020-04-04 NOTE — Therapy (Signed)
[place] [facility] [address], [place], [address] Phone: [phone]   Fax:  [phone]  Occupational Therapy Treatment  Patient Details  Name: Kristin Sparks MRN: [id number] Date of Birth: 10/26/1955 Referring Provider (OT): [place], [place]    Encounter Date: 04/04/2020   OT End of Session - 04/04/20 1251    Visit Number 4    Number of Visits 10    Date for OT Re-Evaluation 04/28/20    OT Start Time 0915    OT Stop Time 1007    OT Time Calculation (min) 52 min    Activity Tolerance Patient tolerated treatment well           Past Medical History:  Diagnosis Date  . Allergic rhinitis   . Asthma   . Elevated cholesterol   . Neuropathy     Past Surgical History:  Procedure Laterality Date  . NASAL SINUS SURGERY    . OVARIAN CYST REMOVAL      There were no vitals filed for this visit.   Subjective Assessment - 04/04/20 1249    Subjective  Wrist feels better- less pain and swelling and done the water with the hot and cold again - and more mobility in wrist    Pertinent History Pt fell and fracture her wrist 02/04/2020 , was casted 02/11/2020 - xray showed healing and in velcro splint - refer to OT/hand therapy on 03/19/2020    Patient Stated Goals I want to be able to use my R hand to work out , walk my dog, bike , run and just to things around my house    Currently in Pain? Yes    Pain Score 2     Pain Location Wrist    Pain Orientation Right    Pain Descriptors / Indicators Aching;Tightness    Pain Type Acute pain    Pain Onset More than a month ago    Aggravating Factors  wrist flexion stretch              [facility] OT Assessment - 04/04/20 0001      AROM   Right Forearm Pronation 80 Degrees    Right Wrist Extension 50 Degrees    Right Wrist Flexion 35 Degrees    Right Wrist Radial Deviation 25 Degrees    Right Wrist Ulnar Deviation 20 Degrees      Strength   Right Hand Grip (lbs) 17    Right  Hand Lateral Pinch 12 lbs    Right Hand 3 Point Pinch 7 lbs    Left Hand Grip (lbs) 48    Left Hand Lateral Pinch 15 lbs    Left Hand 3 Point Pinch 16 lbs            pt show decrease edema and pain - increase AROM  And assess this date her grip and prehension strength - see flowsheet         OT Treatments/Exercises (OP) - 04/04/20 0001      RUE Fluidotherapy   Number Minutes Fluidotherapy 8 Minutes    RUE Fluidotherapy Location Wrist;Hand    Comments AROM in all planes          Pt going to be out of town for week   reviewed again her HEP  PRior to ROM  soft tissue mobs done - MC spreads and webspace -graston tool nr 2 done light sweeping over volar forearrm and wrist  AAROM and AROM for  thumb IP and MC flexion - composite flexion  Cont with isotoner glove to uses night time and some during day under her splintas needed  Keep pain under 2/10with HEP ContrastTo cont with at home  Cont with tendon glidess at home  But focus on composite flexion  And can add in 5 days light blue putty for gripping and 3 point grip - pain free 10 reps   increase end of next week - 2nd set if pain free  And opposition to all digits and sliding down 5th- increase AROM and ease after doing thumb IP and MC flexion  10 reps  review this date HEP for  AAROM RD, UD and wrist flexion , ext over edge of table  Can also do prayer stretch and wrist flexion stretch over armrest  Pain less than 2 /10  2 x day  1 lbs for wrist sup /pro with Benik splint  And RD, UD to side  Pain free   10 reps 2 sets of 10  And increase middle next week - 3 rd set if pain free  2 x day  Use in light activities - 1-2 lbs limited         OT Education - 04/04/20 1251    Education Details review new HEP for the week - while sher is on vacation    Person(s) Educated Patient    Methods Explanation;Demonstration;Tactile cues;Verbal cues;Handout    Comprehension Verbal cues required;Returned  demonstration;Verbalized understanding            OT Short Term Goals - 03/24/20 1250      OT SHORT TERM GOAL #1   Title Pt to be independent in HEP to decrease edema and pain - to do composite fist and increase wrist AROM in all planes    Baseline no knowledge on HEP -and decrease AROM in all planes - tight and stiff digits - increase edema - pain 6/10    Time 2    Period Weeks    Status New    Target Date 04/07/20             OT Long Term Goals - 03/24/20 1251      OT LONG TERM GOAL #1   Title R wrist AROM improve to [facility] to use hand in turning doorknob, use hand in more than 75% of ADL's    Baseline decrease AROM in all planes R wrist , only using hand in 20 % of ADL's , cannot turn doorknob    Time 4    Period Weeks    Status New    Target Date 04/21/20      OT LONG TERM GOAL #2   Title R grip and prehension strength increase with more than 50% compar to L to carry more than 5 lbs, bike at gym and walk dog    Baseline no strength  yet- come out of cast last week -and in wrist splint    Time 5    Period Weeks    Status New    Target Date 04/28/20      OT LONG TERM GOAL #3   Title R wrist strength increase to more than 4+/5 to improve function on PRWHE by 20 points    Baseline No strength yet - come out of cast last week -PRWHE function at eval 39/50    Time 5    Period Weeks    Status New    Target Date 04/28/20  Plan - 04/04/20 1252    Clinical Impression Statement Pt is 9 wks out from R distal radius fx - pt had increase pain and edema last time over dorsal hand and wrist - but show increase AROM this date at wrist - flexion sitll worse than extention - but progressing with AAROM of wrist - pt to focus on composite lfexion of digits and can add light putty for grip and 3 point while this coming week on vacation - and will follow up in 2 wks again with me    OT Occupational Profile and History Problem Focused Assessment - Including review of  records relating to presenting problem    Occupational performance deficits (Please refer to evaluation for details): ADL's;IADL's;Play;Leisure;Social Participation    Body Structure / Function / Physical Skills ADL;Edema;Decreased knowledge of precautions;Flexibility;ROM;UE functional use;Strength;Pain;IADL    Rehab Potential Good    Clinical Decision Making Limited treatment options, no task modification necessary    Comorbidities Affecting Occupational Performance: May have comorbidities impacting occupational performance    Modification or Assistance to Complete Evaluation  No modification of tasks or assist necessary to complete eval    OT Frequency 2x / week    OT Duration 4 weeks    OT Treatment/Interventions Self-care/ADL training;Patient/family education;Splinting;Paraffin;Fluidtherapy;Contrast Bath;Manual Therapy;Passive range of motion    Plan assess progress with HEP    OT Home Exercise Plan see pt instruction    Consulted and Agree with Plan of Care Patient           Patient will benefit from skilled therapeutic intervention in order to improve the following deficits and impairments:   Body Structure / Function / Physical Skills: ADL, Edema, Decreased knowledge of precautions, Flexibility, ROM, UE functional use, Strength, Pain, IADL       Visit Diagnosis: Stiffness of right hand, not elsewhere classified  Muscle weakness (generalized)  Localized edema  Stiffness of right wrist, not elsewhere classified  Pain in right wrist    Problem List Patient Active Problem List   Diagnosis Date Noted  . Generalized anxiety disorder 07/12/2018  . Severe recurrent major depression without psychotic features ([place]) 07/12/2018  . Rosacea 06/08/2017  . Left knee pain 06/08/2017  . Non-seasonal allergic rhinitis due to pollen 12/09/2015  . Pure hypercholesterolemia 12/09/2015  . Idiopathic sensorimotor axonal neuropathy 06/10/2015  . Asthma 08/19/2014    Rosalyn Gess  OTR/L,CLT  04/04/2020, 12:55 PM  [place] [facility] [address] [place], [address] Phone: [phone]   Fax:  [phone]  Name: SOLANA COGGIN MRN: [id number] Date of Birth: 09/15/1956

## 2020-04-14 ENCOUNTER — Ambulatory Visit: Payer: Medicare Other | Admitting: Occupational Therapy

## 2020-04-15 ENCOUNTER — Ambulatory Visit (INDEPENDENT_AMBULATORY_CARE_PROVIDER_SITE_OTHER): Payer: Medicare Other | Admitting: Psychology

## 2020-04-15 DIAGNOSIS — F331 Major depressive disorder, recurrent, moderate: Secondary | ICD-10-CM

## 2020-04-15 DIAGNOSIS — F411 Generalized anxiety disorder: Secondary | ICD-10-CM

## 2020-04-15 DIAGNOSIS — F4323 Adjustment disorder with mixed anxiety and depressed mood: Secondary | ICD-10-CM | POA: Diagnosis not present

## 2020-04-17 ENCOUNTER — Encounter: Payer: Self-pay | Admitting: Occupational Therapy

## 2020-04-17 ENCOUNTER — Other Ambulatory Visit: Payer: Self-pay

## 2020-04-17 ENCOUNTER — Ambulatory Visit: Payer: Medicare Other | Admitting: Occupational Therapy

## 2020-04-17 DIAGNOSIS — M25631 Stiffness of right wrist, not elsewhere classified: Secondary | ICD-10-CM

## 2020-04-17 DIAGNOSIS — M25641 Stiffness of right hand, not elsewhere classified: Secondary | ICD-10-CM | POA: Diagnosis not present

## 2020-04-17 DIAGNOSIS — M25531 Pain in right wrist: Secondary | ICD-10-CM

## 2020-04-17 DIAGNOSIS — R600 Localized edema: Secondary | ICD-10-CM

## 2020-04-17 DIAGNOSIS — M6281 Muscle weakness (generalized): Secondary | ICD-10-CM

## 2020-04-17 NOTE — Therapy (Signed)
[place] [facility] [address], [place], [address] Phone: [phone]   Fax:  [phone]  Occupational Therapy Treatment  Patient Details  Name: Kristin Sparks MRN: [id number] Date of Birth: 05/02/1956 Referring Provider (OT): [place], [place]    Encounter Date: 04/17/2020   OT End of Session - 04/17/20 1307    Visit Number 5    Number of Visits 10    Date for OT Re-Evaluation 04/28/20    OT Start Time 1300    OT Stop Time 1345    OT Time Calculation (min) 45 min    Activity Tolerance Patient tolerated treatment well           Past Medical History:  Diagnosis Date  . Allergic rhinitis   . Asthma   . Elevated cholesterol   . Neuropathy     Past Surgical History:  Procedure Laterality Date  . NASAL SINUS SURGERY    . OVARIAN CYST REMOVAL      There were no vitals filed for this visit.   Subjective Assessment - 04/17/20 1303    Subjective  Patient reports some days are better than other, getting restless now and wants to use hand more for tasks at home.    Pertinent History Pt fell and fracture her wrist 02/04/2020 , was casted 02/11/2020 - xray showed healing and in velcro splint - refer to OT/hand therapy on 03/19/2020    Patient Stated Goals I want to be able to use my R hand to work out , walk my dog, bike , run and just to things around my house              [facility] OT Assessment - 04/17/20 1330      AROM   Right Forearm Pronation 90 Degrees    Right Forearm Supination 90 Degrees    Right Wrist Extension 50 Degrees    Right Wrist Flexion 40 Degrees    Right Wrist Radial Deviation 22 Degrees    Right Wrist Ulnar Deviation 24 Degrees    Left Wrist Extension 72 Degrees    Left Wrist Flexion 90 Degrees    Left Wrist Radial Deviation 24 Degrees    Left Wrist Ulnar Deviation 35 Degrees      Strength   Right Hand Grip (lbs) 20    Right Hand Lateral Pinch 12 lbs    Right Hand 3 Point Pinch 7 lbs     Left Hand Grip (lbs) 55    Left Hand Lateral Pinch 15 lbs    Left Hand 3 Point Pinch 15 lbs      Right Hand AROM   R Index  MCP 0-90 75 Degrees    R Index PIP 0-100 95 Degrees    R Long  MCP 0-90 90 Degrees    R Long PIP 0-100 100 Degrees    R Ring  MCP 0-90 90 Degrees    R Ring PIP 0-100 100 Degrees    R Little  MCP 0-90 85 Degrees    R Little PIP 0-100 100 Degrees          Contrast performed for management of mild edema in right hand, 11 mins to right hand and wrist.   Following contrast, patient seen for manual therapy skills for soft tissue mobilization to wrist and hand along with use of Graston tool Nr 2 to volar forearm and wrist prior to ROM exercises.  Reassessment of ROM and strength as  per flow sheet above.    AAROM/AROM for tendon gliding exercises, with therapist demo and cues Thumb IP and MCP flexion  Wrist RD, UD, flex/ext over edge of table Prayer stretch Supination/pronation.   1# weight for RD< UD< sup/pro with benik splint  Increased to 3 sets pain free, 2 times a day.   Opposition of thumb to each digit and base of small finger.                      OT Education - 04/17/20 1307    Education Details HEP    Person(s) Educated Patient    Methods Explanation;Demonstration;Tactile cues;Verbal cues;Handout    Comprehension Verbal cues required;Returned demonstration;Verbalized understanding            OT Short Term Goals - 03/24/20 1250      OT SHORT TERM GOAL #1   Title Pt to be independent in HEP to decrease edema and pain - to do composite fist and increase wrist AROM in all planes    Baseline no knowledge on HEP -and decrease AROM in all planes - tight and stiff digits - increase edema - pain 6/10    Time 2    Period Weeks    Status New    Target Date 04/07/20             OT Long Term Goals - 03/24/20 1251      OT LONG TERM GOAL #1   Title R wrist AROM improve to [facility] to use hand in turning doorknob, use hand in more  than 75% of ADL's    Baseline decrease AROM in all planes R wrist , only using hand in 20 % of ADL's , cannot turn doorknob    Time 4    Period Weeks    Status New    Target Date 04/21/20      OT LONG TERM GOAL #2   Title R grip and prehension strength increase with more than 50% compar to L to carry more than 5 lbs, bike at gym and walk dog    Baseline no strength  yet- come out of cast last week -and in wrist splint    Time 5    Period Weeks    Status New    Target Date 04/28/20      OT LONG TERM GOAL #3   Title R wrist strength increase to more than 4+/5 to improve function on PRWHE by 20 points    Baseline No strength yet - come out of cast last week -PRWHE function at eval 39/50    Time 5    Period Weeks    Status New    Target Date 04/28/20                 Plan - 04/17/20 1308    Clinical Impression Statement Pt 10 1/2 weeks s/p right distal radius fx, decreased pain and edema this date than last session.  Remains limited with ROM especially with wrist flexion and extension.  Able to demonstrate composite fist on right now gently.  Patient appears hesitant to move with ROM exercises.  She does report attempting to use hand lightly with tasks at home such as holding a wash cloth and washing under opposite arm, washing face.  Patient to continue with HEP  for contrast for mild edema, ROM exercises for wrist and hand.  Continue towards goals to improve hand function for necessary daily tasks.  OT Occupational Profile and History Problem Focused Assessment - Including review of records relating to presenting problem    Occupational performance deficits (Please refer to evaluation for details): ADL's;IADL's;Play;Leisure;Social Participation    Body Structure / Function / Physical Skills ADL;Edema;Decreased knowledge of precautions;Flexibility;ROM;UE functional use;Strength;Pain;IADL    Rehab Potential Good    Clinical Decision Making Limited treatment options, no task  modification necessary    Comorbidities Affecting Occupational Performance: May have comorbidities impacting occupational performance    Modification or Assistance to Complete Evaluation  No modification of tasks or assist necessary to complete eval    OT Frequency 2x / week    OT Duration 4 weeks    OT Treatment/Interventions Self-care/ADL training;Patient/family education;Splinting;Paraffin;Fluidtherapy;Contrast Bath;Manual Therapy;Passive range of motion    Plan assess progress with HEP    OT Home Exercise Plan see pt instruction    Consulted and Agree with Plan of Care Patient           Patient will benefit from skilled therapeutic intervention in order to improve the following deficits and impairments:   Body Structure / Function / Physical Skills: ADL, Edema, Decreased knowledge of precautions, Flexibility, ROM, UE functional use, Strength, Pain, IADL       Visit Diagnosis: Stiffness of right hand, not elsewhere classified  Muscle weakness (generalized)  Localized edema  Stiffness of right wrist, not elsewhere classified  Pain in right wrist    Problem List Patient Active Problem List   Diagnosis Date Noted  . Generalized anxiety disorder 07/12/2018  . Severe recurrent major depression without psychotic features ([place]) 07/12/2018  . Rosacea 06/08/2017  . Left knee pain 06/08/2017  . Non-seasonal allergic rhinitis due to pollen 12/09/2015  . Pure hypercholesterolemia 12/09/2015  . Idiopathic sensorimotor axonal neuropathy 06/10/2015  . Asthma 08/19/2014   Geraldina Parrott T Tomasita Morrow, OTR/L, CLT  Issai Werling 04/17/2020, 4:41 PM  [place] [address] [place], [address] Phone: [phone]   Fax:  [phone]  Name: MARRAH VANEVERY MRN: [id number] Date of Birth: 1956/08/17

## 2020-04-23 ENCOUNTER — Ambulatory Visit: Payer: Medicare Other | Admitting: Occupational Therapy

## 2020-04-23 ENCOUNTER — Other Ambulatory Visit: Payer: Self-pay

## 2020-04-23 DIAGNOSIS — R600 Localized edema: Secondary | ICD-10-CM

## 2020-04-23 DIAGNOSIS — M25641 Stiffness of right hand, not elsewhere classified: Secondary | ICD-10-CM

## 2020-04-23 DIAGNOSIS — M25531 Pain in right wrist: Secondary | ICD-10-CM

## 2020-04-23 DIAGNOSIS — M25631 Stiffness of right wrist, not elsewhere classified: Secondary | ICD-10-CM

## 2020-04-23 DIAGNOSIS — M6281 Muscle weakness (generalized): Secondary | ICD-10-CM

## 2020-04-23 NOTE — Therapy (Signed)
[facility] [address], [place], [address] Phone: [phone]   Fax:  [phone]  Occupational Therapy Treatment  Patient Details  Name: Kristin Sparks MRN: [id number] Date of Birth: 1956/08/11 Referring Provider (OT): [place], [place]    Encounter Date: 04/23/2020   OT End of Session - 04/23/20 1549    Visit Number 6    Number of Visits 10    Date for OT Re-Evaluation 04/28/20    OT Start Time 1303    OT Stop Time 1346    OT Time Calculation (min) 43 min    Activity Tolerance Patient tolerated treatment well    Behavior During Therapy [facility] for tasks assessed/performed           Past Medical History:  Diagnosis Date  . Allergic rhinitis   . Asthma   . Elevated cholesterol   . Neuropathy     Past Surgical History:  Procedure Laterality Date  . NASAL SINUS SURGERY    . OVARIAN CYST REMOVAL      There were no vitals filed for this visit.   Subjective Assessment - 04/23/20 1547    Subjective  I started using my hand more - stiff -feel little weird and stiff - but doing it - still stiff bending it up and down    Pertinent History Pt fell and fracture her wrist 02/04/2020 , was casted 02/11/2020 - xray showed healing and in velcro splint - refer to OT/hand therapy on 03/19/2020    Patient Stated Goals I want to be able to use my R hand to work out , walk my dog, bike , run and just to things around my house    Currently in Pain? No/denies              [facility] OT Assessment - 04/23/20 0001      AROM   Right Forearm Pronation 90 Degrees    Right Forearm Supination 90 Degrees    Right Wrist Extension 60 Degrees    Right Wrist Flexion 40 Degrees    Right Wrist Radial Deviation 35 Degrees    Right Wrist Ulnar Deviation 20 Degrees    Left Wrist Extension 72 Degrees    Left Wrist Flexion 90 Degrees    Left Wrist Radial Deviation 24 Degrees    Left Wrist Ulnar Deviation 35 Degrees      Strength    Right Hand Grip (lbs) 21    Right Hand Lateral Pinch 12 lbs    Right Hand 3 Point Pinch 12 lbs    Left Hand Grip (lbs) 55    Left Hand Lateral Pinch 15 lbs    Left Hand 3 Point Pinch 15 lbs                assess pt AROM for R wrist in all planes and grip /prehension strength - see flowsheet Assess putty - mix teal and green for med firm for gripping and 3 point to do at home  Decrease to about 50% of what she was doing with her light blue putty   Done 2 lbs weight for wrist RD, UD , sup ,pron - pt able to do 12 reps  No pain  Pt to do at home  Add also table slides for pt for wrist extention- 20 reps      OT Treatments/Exercises (OP) - 04/23/20 0001      RUE Paraffin   Number Minutes Paraffin 8  Minutes    RUE Paraffin Location Wrist    Comments flexion stretch 2 min at time x 3  rest inbetween          pt increase with 10 degrees of wrist flexion   pt to do at home using heating pad - 3 x 2 min - prior to table AAROM for wrist flexion , extention - pt to do double time on wrist flexion  Done in clinic- needed min A  Cont with PROM RD, UD - 10 reps prior to weight           OT Education - 04/23/20 1548    Education Details changes to HEP    Person(s) Educated Patient    Methods Explanation;Demonstration;Tactile cues;Verbal cues;Handout    Comprehension Verbal cues required;Returned demonstration;Verbalized understanding            OT Short Term Goals - 03/24/20 1250      OT SHORT TERM GOAL #1   Title Pt to be independent in HEP to decrease edema and pain - to do composite fist and increase wrist AROM in all planes    Baseline no knowledge on HEP -and decrease AROM in all planes - tight and stiff digits - increase edema - pain 6/10    Time 2    Period Weeks    Status New    Target Date 04/07/20             OT Long Term Goals - 03/24/20 1251      OT LONG TERM GOAL #1   Title R wrist AROM improve to [facility] to use hand in turning doorknob, use hand in  more than 75% of ADL's    Baseline decrease AROM in all planes R wrist , only using hand in 20 % of ADL's , cannot turn doorknob    Time 4    Period Weeks    Status New    Target Date 04/21/20      OT LONG TERM GOAL #2   Title R grip and prehension strength increase with more than 50% compar to L to carry more than 5 lbs, bike at gym and walk dog    Baseline no strength  yet- come out of cast last week -and in wrist splint    Time 5    Period Weeks    Status New    Target Date 04/28/20      OT LONG TERM GOAL #3   Title R wrist strength increase to more than 4+/5 to improve function on PRWHE by 20 points    Baseline No strength yet - come out of cast last week -PRWHE function at eval 39/50    Time 5    Period Weeks    Status New    Target Date 04/28/20                 Plan - 04/23/20 1549    Clinical Impression Statement Pt is about 11 1/2 wks s/p R distal radius fx - pt making good progress in AROM in all planes except flexion of wrist this date - and was able to upgrade to 2 lbs for wrist except flexion ,ext - 3 poin pinch increase greatly - but grip stayed about the same - upgrade her putty this date- and pt to focus on wrist flexion prolonged stretch -did increase in session with 10 degrees - pt to cont to increase functional use of R hand    OT Occupational  Profile and History Problem Focused Assessment - Including review of records relating to presenting problem    Occupational performance deficits (Please refer to evaluation for details): ADL's;IADL's;Play;Leisure;Social Participation    Body Structure / Function / Physical Skills ADL;Edema;Decreased knowledge of precautions;Flexibility;ROM;UE functional use;Strength;Pain;IADL    Rehab Potential Good    Clinical Decision Making Limited treatment options, no task modification necessary    Comorbidities Affecting Occupational Performance: May have comorbidities impacting occupational performance    Modification or  Assistance to Complete Evaluation  No modification of tasks or assist necessary to complete eval    OT Frequency 2x / week    OT Duration 4 weeks    OT Treatment/Interventions Self-care/ADL training;Patient/family education;Splinting;Paraffin;Fluidtherapy;Contrast Bath;Manual Therapy;Passive range of motion    Plan assess progress with wrist flexion    OT Home Exercise Plan see pt instruction    Consulted and Agree with Plan of Care Patient           Patient will benefit from skilled therapeutic intervention in order to improve the following deficits and impairments:   Body Structure / Function / Physical Skills: ADL, Edema, Decreased knowledge of precautions, Flexibility, ROM, UE functional use, Strength, Pain, IADL       Visit Diagnosis: Stiffness of right hand, not elsewhere classified  Muscle weakness (generalized)  Localized edema  Stiffness of right wrist, not elsewhere classified  Pain in right wrist    Problem List Patient Active Problem List   Diagnosis Date Noted  . Generalized anxiety disorder 07/12/2018  . Severe recurrent major depression without psychotic features ([place]) 07/12/2018  . Rosacea 06/08/2017  . Left knee pain 06/08/2017  . Non-seasonal allergic rhinitis due to pollen 12/09/2015  . Pure hypercholesterolemia 12/09/2015  . Idiopathic sensorimotor axonal neuropathy 06/10/2015  . Asthma 08/19/2014    Rosalyn Gess OTR/L,CLT 04/23/2020, 3:52 PM  [place] [address], [place], [address] Phone: [phone]   Fax:  [phone]  Name: VONDELL SOWELL MRN: [id number] Date of Birth: [birth year]

## 2020-04-25 ENCOUNTER — Ambulatory Visit: Payer: Medicare Other | Admitting: Occupational Therapy

## 2020-04-25 ENCOUNTER — Other Ambulatory Visit: Payer: Self-pay

## 2020-04-25 DIAGNOSIS — M25631 Stiffness of right wrist, not elsewhere classified: Secondary | ICD-10-CM

## 2020-04-25 DIAGNOSIS — M25641 Stiffness of right hand, not elsewhere classified: Secondary | ICD-10-CM

## 2020-04-25 DIAGNOSIS — M25531 Pain in right wrist: Secondary | ICD-10-CM

## 2020-04-25 DIAGNOSIS — M6281 Muscle weakness (generalized): Secondary | ICD-10-CM

## 2020-04-25 DIAGNOSIS — R600 Localized edema: Secondary | ICD-10-CM

## 2020-04-25 NOTE — Therapy (Signed)
[place] [facility] [address], [place], [address] Phone: [phone]   Fax:  [phone]  Occupational Therapy Treatment  Patient Details  Name: Kristin Sparks MRN: [id number] Date of Birth: 11-29-1955 Referring Provider (OT): [place], [place]    Encounter Date: 04/25/2020   OT End of Session - 04/25/20 1051    Visit Number 7    Number of Visits 10    Date for OT Re-Evaluation 04/28/20    OT Start Time 1003    OT Stop Time 1046    OT Time Calculation (min) 43 min    Activity Tolerance Patient tolerated treatment well    Behavior During Therapy [facility] for tasks assessed/performed           Past Medical History:  Diagnosis Date  . Allergic rhinitis   . Asthma   . Elevated cholesterol   . Neuropathy     Past Surgical History:  Procedure Laterality Date  . NASAL SINUS SURGERY    . OVARIAN CYST REMOVAL      There were no vitals filed for this visit.   Subjective Assessment - 04/25/20 1007    Subjective  I done the stretches like you told me with the heatingpad - I can tell different- it is not as stiff    Pertinent History Pt fell and fracture her wrist 02/04/2020 , was casted 02/11/2020 - xray showed healing and in velcro splint - refer to OT/hand therapy on 03/19/2020    Patient Stated Goals I want to be able to use my R hand to work out , walk my dog, bike , run and just to things around my house    Currently in Pain? No/denies              [facility] OT Assessment - 04/25/20 0001      AROM   Right Wrist Extension 62 Degrees    Right Wrist Flexion 45 Degrees                  OT Treatments/Exercises (OP) - 04/25/20 0001      RUE Paraffin   Number Minutes Paraffin 8 Minutes    RUE Paraffin Location Wrist    Comments flexion stretch 3 x 2 min           AAROM for wrist flexion , ext - done by OT 10 reps  Flexion increase to 50 and after AAROM over edge of table 52 degrees  extnention 60  degrees      2 lbs weight for wrist RD, UD , sup ,pron - pt able to do 12 reps  And 2 sets this date No pain  Pt to do at home  Cont with table slides for pt for wrist extention- 20 reps  Add and done in clinic this date 1 lbs for wrist flexion , ext 12 reps  pt to do at home using heating pad - 3 x 2 min - prior to table AAROM for wrist flexion , extention - pt to do double time on wrist flexion  Done in clinic- needed min A  Cont with PROM RD, UD , flexion and extention - 10 reps prior to weight   Can increase SUnday if no pain-  Increase another set for each  Cot with putty for gripping and 3 point pinch  Doing med green - can do 2 sets of 12-15          OT Education -  04/25/20 1051    Education Details review HEP changes    Person(s) Educated Patient    Methods Explanation;Demonstration;Tactile cues;Verbal cues;Handout    Comprehension Verbal cues required;Returned demonstration;Verbalized understanding            OT Short Term Goals - 03/24/20 1250      OT SHORT TERM GOAL #1   Title Pt to be independent in HEP to decrease edema and pain - to do composite fist and increase wrist AROM in all planes    Baseline no knowledge on HEP -and decrease AROM in all planes - tight and stiff digits - increase edema - pain 6/10    Time 2    Period Weeks    Status New    Target Date 04/07/20             OT Long Term Goals - 03/24/20 1251      OT LONG TERM GOAL #1   Title R wrist AROM improve to [facility] to use hand in turning doorknob, use hand in more than 75% of ADL's    Baseline decrease AROM in all planes R wrist , only using hand in 20 % of ADL's , cannot turn doorknob    Time 4    Period Weeks    Status New    Target Date 04/21/20      OT LONG TERM GOAL #2   Title R grip and prehension strength increase with more than 50% compar to L to carry more than 5 lbs, bike at gym and walk dog    Baseline no strength  yet- come out of cast last week -and in wrist splint    Time  5    Period Weeks    Status New    Target Date 04/28/20      OT LONG TERM GOAL #3   Title R wrist strength increase to more than 4+/5 to improve function on PRWHE by 20 points    Baseline No strength yet - come out of cast last week -PRWHE function at eval 39/50    Time 5    Period Weeks    Status New    Target Date 04/28/20                 Plan - 04/25/20 1051    Clinical Impression Statement Pt is 12 wks s/p R distal radius fx - pt show increase flexion and extention with changing HEP to add prolonged stretch - tolerating 2 lbs weight well - did add 1 lbs for wrist flexion , ext - cont putty - increase to 2 sets for 2 lbs and putty    OT Occupational Profile and History Problem Focused Assessment - Including review of records relating to presenting problem    Occupational performance deficits (Please refer to evaluation for details): ADL's;IADL's;Play;Leisure;Social Participation    Body Structure / Function / Physical Skills ADL;Edema;Decreased knowledge of precautions;Flexibility;ROM;UE functional use;Strength;Pain;IADL    Rehab Potential Good    Clinical Decision Making Limited treatment options, no task modification necessary    Comorbidities Affecting Occupational Performance: May have comorbidities impacting occupational performance    Modification or Assistance to Complete Evaluation  No modification of tasks or assist necessary to complete eval    OT Frequency 2x / week    OT Duration 4 weeks    OT Treatment/Interventions Self-care/ADL training;Patient/family education;Splinting;Paraffin;Fluidtherapy;Contrast Bath;Manual Therapy;Passive range of motion    Plan assess progress with wrist flexion    OT Home Exercise Plan  see pt instruction    Consulted and Agree with Plan of Care Patient           Patient will benefit from skilled therapeutic intervention in order to improve the following deficits and impairments:   Body Structure / Function / Physical Skills: ADL,  Edema, Decreased knowledge of precautions, Flexibility, ROM, UE functional use, Strength, Pain, IADL       Visit Diagnosis: Stiffness of right hand, not elsewhere classified  Muscle weakness (generalized)  Localized edema  Stiffness of right wrist, not elsewhere classified  Pain in right wrist    Problem List Patient Active Problem List   Diagnosis Date Noted  . Generalized anxiety disorder 07/12/2018  . Severe recurrent major depression without psychotic features ([place]) 07/12/2018  . Rosacea 06/08/2017  . Left knee pain 06/08/2017  . Non-seasonal allergic rhinitis due to pollen 12/09/2015  . Pure hypercholesterolemia 12/09/2015  . Idiopathic sensorimotor axonal neuropathy 06/10/2015  . Asthma 08/19/2014    Rosalyn Gess OTR/L,CLT 04/25/2020, 10:54 AM  [place] [facility] [address], [place], [address] Phone: [phone]   Fax:  [phone]  Name: Kristin Sparks MRN: [id number] Date of Birth: [birth year]

## 2020-04-29 ENCOUNTER — Ambulatory Visit (INDEPENDENT_AMBULATORY_CARE_PROVIDER_SITE_OTHER): Payer: Medicare Other | Admitting: Psychology

## 2020-04-29 DIAGNOSIS — F321 Major depressive disorder, single episode, moderate: Secondary | ICD-10-CM | POA: Diagnosis not present

## 2020-04-29 DIAGNOSIS — F411 Generalized anxiety disorder: Secondary | ICD-10-CM | POA: Diagnosis not present

## 2020-05-01 ENCOUNTER — Other Ambulatory Visit: Payer: Self-pay

## 2020-05-01 ENCOUNTER — Ambulatory Visit: Payer: Medicare Other | Attending: Orthopedic Surgery | Admitting: Occupational Therapy

## 2020-05-01 DIAGNOSIS — M25641 Stiffness of right hand, not elsewhere classified: Secondary | ICD-10-CM

## 2020-05-01 DIAGNOSIS — M25531 Pain in right wrist: Secondary | ICD-10-CM | POA: Diagnosis present

## 2020-05-01 DIAGNOSIS — R600 Localized edema: Secondary | ICD-10-CM | POA: Insufficient documentation

## 2020-05-01 DIAGNOSIS — M25631 Stiffness of right wrist, not elsewhere classified: Secondary | ICD-10-CM | POA: Diagnosis present

## 2020-05-01 DIAGNOSIS — M6281 Muscle weakness (generalized): Secondary | ICD-10-CM | POA: Insufficient documentation

## 2020-05-01 NOTE — Therapy (Signed)
[place] [facility] [address] [place], [address] Phone: [phone]   Fax:  [phone]  Occupational Therapy Treatment  Patient Details  Name: Kristin Sparks MRN: [id number] Date of Birth: 14-Jan-1956 Referring Provider (OT): [place], [place]    Encounter Date: 05/01/2020   OT End of Session - 05/01/20 1130    Visit Number 8    Number of Visits 11    Date for OT Re-Evaluation 05/27/20    OT Start Time 2703    OT Stop Time 1120    OT Time Calculation (min) 48 min    Activity Tolerance Patient tolerated treatment well    Behavior During Therapy [facility] for tasks assessed/performed           Past Medical History:  Diagnosis Date  . Allergic rhinitis   . Asthma   . Elevated cholesterol   . Neuropathy     Past Surgical History:  Procedure Laterality Date  . NASAL SINUS SURGERY    . OVARIAN CYST REMOVAL      There were no vitals filed for this visit.   Subjective Assessment - 05/01/20 1127    Subjective  I was with my sister in [place] this past week - using my hand more - but I think I over done the putty yesterday and I pulled something - Can I use it in the yard this weekend    Pertinent History Pt fell and fracture her wrist 02/04/2020 , was casted 02/11/2020 - xray showed healing and in velcro splint - refer to OT/hand therapy on 03/19/2020    Patient Stated Goals I want to be able to use my R hand to work out , walk my dog, bike , run and just to things around my house    Currently in Pain? Yes    Pain Score 2     Pain Location Wrist    Pain Orientation Right    Pain Descriptors / Indicators Aching;Tightness    Pain Type Acute pain    Pain Onset More than a month ago    Pain Frequency Intermittent              [facility] OT Assessment - 05/01/20 0001      AROM   Right Wrist Extension 65 Degrees    Right Wrist Flexion 42 Degrees           Pt report after leaving sister house in [facility] yesterday am - volar  wrist had some pain - but told pt could be putty - or just driving and holding steering wheel Encourage to use hand more in activities- can do yard work - if pain more than 2/10 - try another day  If linger - wait day and try again          OT Treatments/Exercises (OP) - 05/01/20 0001      RUE Paraffin   Number Minutes Paraffin 10 Minutes    RUE Paraffin Location Wrist    Comments flexion , ext stretch  2 min  each x 2             AAROM for wrist flexion , ext - done by OT 10 reps  Flexion increase to  and after AAROM over edge of table 58 degrees  extnention 70 degrees      cont with 2 lbs weight for wrist RD, UD , sup ,pron - pt able to do 12 reps  And 2 sets this  date No pain  Pt to do at home  Cont with table slides for pt for wrist extention- 20 reps - done great Cont  1 lbs for wrist flexion , ext 12 reps - relax into flexion for stretch pt to do at home using heating pad - 3 x 2 min - prior to table AAROM for wrist flexion , extention - pt to do double time on wrist flexion  Done in clinic- needed min A  Cont with PROM RD, UD , flexion and extention - 10 reps prior to weight- double time for wrist flexion  Can do 2-3 sets - but if using hand a lot in yard work - then can skep putty and 2 lbs weight  Work on ROM   Has putty - light green to work on grip and prehension - but pain free         OT Education - 05/01/20 1130    Education Details review HEP changes    Person(s) Educated Patient    Methods Explanation;Demonstration;Tactile cues;Verbal cues;Handout    Comprehension Verbal cues required;Returned demonstration;Verbalized understanding            OT Short Term Goals - 05/01/20 1133      OT SHORT TERM GOAL #1   Title Pt to be independent in HEP to decrease edema and pain - to do composite fist and increase wrist AROM in all planes    Status Achieved             OT Long Term Goals - 05/01/20 1133      OT LONG TERM GOAL #1   Title R  wrist AROM improve to [facility] to use hand in turning doorknob, use hand in more than 75% of ADL's    Baseline using hand more - but wrist flexion  decrease still at 40 -    Time 4    Period Weeks    Status On-going    Target Date 05/27/20      OT LONG TERM GOAL #2   Title R grip and prehension strength increase with more than 50% compar to L to carry more than 5 lbs, bike at gym and walk dog    Baseline R 21, L 55 lbs    Time 4    Period Weeks    Status On-going    Target Date 05/27/20      OT LONG TERM GOAL #3   Title R wrist strength increase to more than 4+/5 to improve function on PRWHE by 20 points    Baseline -PRWHE function at eval 39/50 - need to assess next time PRWHE- strength in wrist AROM  plane 4+/5    Time 4    Period Weeks    Status On-going    Target Date 05/27/20                 Plan - 05/01/20 1131    Clinical Impression Statement Pt is 13 wks s/p R distal radius fx- pt show increase AROM and strength in R wrist - increase functional use - do need encouragement to use hand more -and good strength in plane of AROM at wrist- but pt to focus on wrist flexion , and strength - plan about 3 more sessions over the next 3-4 wks    OT Occupational Profile and History Problem Focused Assessment - Including review of records relating to presenting problem    Occupational performance deficits (Please refer to evaluation for details): ADL's;IADL's;Play;Leisure;Social Participation  Body Structure / Function / Physical Skills ADL;Edema;Decreased knowledge of precautions;Flexibility;ROM;UE functional use;Strength;Pain;IADL    Rehab Potential Good    Clinical Decision Making Limited treatment options, no task modification necessary    Comorbidities Affecting Occupational Performance: May have comorbidities impacting occupational performance    Modification or Assistance to Complete Evaluation  No modification of tasks or assist necessary to complete eval    OT Frequency --    1 wks for 2 wks and , biweekly   OT Duration 4 weeks    OT Treatment/Interventions Self-care/ADL training;Patient/family education;Splinting;Paraffin;Fluidtherapy;Contrast Bath;Manual Therapy;Passive range of motion    Plan assess progress with wrist flexion    OT Home Exercise Plan see pt instruction    Consulted and Agree with Plan of Care Patient           Patient will benefit from skilled therapeutic intervention in order to improve the following deficits and impairments:   Body Structure / Function / Physical Skills: ADL, Edema, Decreased knowledge of precautions, Flexibility, ROM, UE functional use, Strength, Pain, IADL       Visit Diagnosis: Stiffness of right hand, not elsewhere classified - Plan: Ot plan of care cert/re-cert  Muscle weakness (generalized) - Plan: Ot plan of care cert/re-cert  Stiffness of right wrist, not elsewhere classified - Plan: Ot plan of care cert/re-cert  Pain in right wrist - Plan: Ot plan of care cert/re-cert    Problem List Patient Active Problem List   Diagnosis Date Noted  . Generalized anxiety disorder 07/12/2018  . Severe recurrent major depression without psychotic features ([place]) 07/12/2018  . Rosacea 06/08/2017  . Left knee pain 06/08/2017  . Non-seasonal allergic rhinitis due to pollen 12/09/2015  . Pure hypercholesterolemia 12/09/2015  . Idiopathic sensorimotor axonal neuropathy 06/10/2015  . Asthma 08/19/2014    Rosalyn Gess OTR/L,CLT 05/01/2020, 11:39 AM  [place] [facility] [address], [place], [address] Phone: [phone]   Fax:  [phone]  Name: YAZMIN LOCHER MRN: [id number] Date of Birth: 1955-12-20

## 2020-05-06 ENCOUNTER — Ambulatory Visit: Payer: Medicare Other | Admitting: Occupational Therapy

## 2020-05-06 ENCOUNTER — Other Ambulatory Visit: Payer: Self-pay

## 2020-05-06 DIAGNOSIS — M25531 Pain in right wrist: Secondary | ICD-10-CM

## 2020-05-06 DIAGNOSIS — R600 Localized edema: Secondary | ICD-10-CM

## 2020-05-06 DIAGNOSIS — M6281 Muscle weakness (generalized): Secondary | ICD-10-CM

## 2020-05-06 DIAGNOSIS — M25631 Stiffness of right wrist, not elsewhere classified: Secondary | ICD-10-CM

## 2020-05-06 DIAGNOSIS — M25641 Stiffness of right hand, not elsewhere classified: Secondary | ICD-10-CM

## 2020-05-06 NOTE — Therapy (Signed)
[place] [facility] [address] [place], [address] Phone: [phone]   Fax:  [phone]  Occupational Therapy Treatment  Patient Details  Name: Kristin Sparks MRN: [id number] Date of Birth: Apr 17, 1956 Referring Provider (OT): [place], [place]    Encounter Date: 05/06/2020   OT End of Session - 05/06/20 1246    Visit Number 9    Number of Visits 11    Date for OT Re-Evaluation 05/27/20    OT Start Time 1761    OT Stop Time 1238    OT Time Calculation (min) 49 min    Activity Tolerance Patient tolerated treatment well    Behavior During Therapy [facility] for tasks assessed/performed           Past Medical History:  Diagnosis Date  . Allergic rhinitis   . Asthma   . Elevated cholesterol   . Neuropathy     Past Surgical History:  Procedure Laterality Date  . NASAL SINUS SURGERY    . OVARIAN CYST REMOVAL      There were no vitals filed for this visit.   Subjective Assessment - 05/06/20 1245    Subjective  Seen the Dr yesterday - had xray - showed good healing - said something about it is tilt and maybe not going to get back the bending down - still getting at times pain shooting down my wrist from my thumb down    Pertinent History Pt fell and fracture her wrist 02/04/2020 , was casted 02/11/2020 - xray showed healing and in velcro splint - refer to OT/hand therapy on 03/19/2020    Patient Stated Goals I want to be able to use my R hand to work out , walk my dog, bike , run and just to things around my house    Currently in Pain? No/denies              [facility] OT Assessment - 05/06/20 0001      AROM   Right Wrist Extension 62 Degrees    Right Wrist Flexion 52 Degrees            wrist flexion did increase - pt to cont to focus on flexion and extention stretches Had pt carry 7 lbs - slight pull - no pain 5 lbs and 6 lbs carry, lift , pull and push  And over head push up  Add 5 lbs for pt HEP for elbow  curls, rowing , triceps, and punches 12 reps - 3 x wk  Could use R hand to push and pull heavy door open         OT Treatments/Exercises (OP) - 05/06/20 0001      RUE Paraffin   Number Minutes Paraffin 8 Minutes    RUE Paraffin Location Wrist    Comments wrist flexion stretch 2x 2 min              AAROM for wrist flexion , ext - done by OT 10 reps  Flexion increase to  and after AAROM over edge of table 60 degrees  extention 70 degrees  cont with 2 lbs weight for wrist RD, UD , sup ,pron - pt able to do 12 reps 3 sets  Pt to do at home Cont withtable slides for pt for wrist extention- 20 reps - done great Upgrade to 2 lbs for wrist flexion , ext 12 reps - relax into flexion for stretch- 1 x 12 reps - increase to  2 nd and 3rd set if no pain - over the next 10 days  pt to do at home using heating pad - 3 x 2 min - prior to table AAROM for wrist flexion , extention - pt to do double time on wrist flexion  Done in clinic Cont with PROM RD, UD, flexion and extention- 10 reps prior to weight- double time for wrist flexion  Can do 2-3 sets -  If using hand a lot in yard work and at home - then can skip putty and 2 lbs weight  Work on ROM   Has putty - light green to work on grip - but pain free  Hold off on prehension - has sometimes shooting pain down radial wrist from thumb - ? Touch of arthritis at [facility] [place]         OT Education - 05/06/20 1246    Education Details review HEP changes    Person(s) Educated Patient    Methods Explanation;Demonstration;Tactile cues;Verbal cues;Handout    Comprehension Verbal cues required;Returned demonstration;Verbalized understanding            OT Short Term Goals - 05/01/20 1133      OT SHORT TERM GOAL #1   Title Pt to be independent in HEP to decrease edema and pain - to do composite fist and increase wrist AROM in all planes    Status Achieved             OT Long Term Goals - 05/01/20 1133      OT LONG TERM GOAL  #1   Title R wrist AROM improve to [facility] to use hand in turning doorknob, use hand in more than 75% of ADL's    Baseline using hand more - but wrist flexion  decrease still at 40 -    Time 4    Period Weeks    Status On-going    Target Date 05/27/20      OT LONG TERM GOAL #2   Title R grip and prehension strength increase with more than 50% compar to L to carry more than 5 lbs, bike at gym and walk dog    Baseline R 21, L 55 lbs    Time 4    Period Weeks    Status On-going    Target Date 05/27/20      OT LONG TERM GOAL #3   Title R wrist strength increase to more than 4+/5 to improve function on PRWHE by 20 points    Baseline -PRWHE function at eval 39/50 - need to assess next time PRWHE- strength in wrist AROM  plane 4+/5    Time 4    Period Weeks    Status On-going    Target Date 05/27/20                 Plan - 05/06/20 1247    Clinical Impression Statement Pt is about 14 wks or 3 months out from R distal radius fx - pt show increase functional use, and AROM at wrist - strnength in ROM - wrist flexion decrease but xray yesterday showed - There is slight dorsal impaction with minimal dorsal tilt. Fracture stable when compared to previous x-rays. Fracture is healed with blending of the fracture site.- that will explain progress slow on flexion of wrist -and harder for pt to maintain - was able to up to 2 lbs weight for wrist -and 5 lbs HEP for elbow and shoulder -encourage pt to use hand more in  ADL's and IADL's    OT Occupational Profile and History Problem Focused Assessment - Including review of records relating to presenting problem    Occupational performance deficits (Please refer to evaluation for details): ADL's;IADL's;Play;Leisure;Social Participation    Body Structure / Function / Physical Skills ADL;Edema;Decreased knowledge of precautions;Flexibility;ROM;UE functional use;Strength;Pain;IADL    Rehab Potential Good    Clinical Decision Making Limited treatment options,  no task modification necessary    Comorbidities Affecting Occupational Performance: May have comorbidities impacting occupational performance    Modification or Assistance to Complete Evaluation  No modification of tasks or assist necessary to complete eval    OT Frequency 1x / week   1 x wk , biweekly   OT Duration 4 weeks    OT Treatment/Interventions Self-care/ADL training;Patient/family education;Splinting;Paraffin;Fluidtherapy;Contrast Bath;Manual Therapy;Passive range of motion    Plan progress assess    OT Home Exercise Plan see pt instruction    Consulted and Agree with Plan of Care Patient           Patient will benefit from skilled therapeutic intervention in order to improve the following deficits and impairments:   Body Structure / Function / Physical Skills: ADL, Edema, Decreased knowledge of precautions, Flexibility, ROM, UE functional use, Strength, Pain, IADL       Visit Diagnosis: Stiffness of right hand, not elsewhere classified  Muscle weakness (generalized)  Stiffness of right wrist, not elsewhere classified  Pain in right wrist  Localized edema    Problem List Patient Active Problem List   Diagnosis Date Noted  . Generalized anxiety disorder 07/12/2018  . Severe recurrent major depression without psychotic features ([place]) 07/12/2018  . Rosacea 06/08/2017  . Left knee pain 06/08/2017  . Non-seasonal allergic rhinitis due to pollen 12/09/2015  . Pure hypercholesterolemia 12/09/2015  . Idiopathic sensorimotor axonal neuropathy 06/10/2015  . Asthma 08/19/2014    Rosalyn Gess OTR/L,CLT 05/06/2020, 12:51 PM  [place] [facility] [address], [place], [address] Phone: [phone]   Fax:  [phone]  Name: Kristin Sparks MRN: [id number] Date of Birth: [birth year]

## 2020-05-13 ENCOUNTER — Ambulatory Visit (INDEPENDENT_AMBULATORY_CARE_PROVIDER_SITE_OTHER): Payer: Medicare Other | Admitting: Psychology

## 2020-05-13 DIAGNOSIS — F321 Major depressive disorder, single episode, moderate: Secondary | ICD-10-CM

## 2020-05-13 DIAGNOSIS — F4323 Adjustment disorder with mixed anxiety and depressed mood: Secondary | ICD-10-CM | POA: Diagnosis not present

## 2020-05-13 DIAGNOSIS — F411 Generalized anxiety disorder: Secondary | ICD-10-CM | POA: Diagnosis not present

## 2020-05-14 ENCOUNTER — Ambulatory Visit: Payer: Medicare Other | Admitting: Occupational Therapy

## 2020-05-14 ENCOUNTER — Other Ambulatory Visit: Payer: Self-pay

## 2020-05-14 DIAGNOSIS — R600 Localized edema: Secondary | ICD-10-CM

## 2020-05-14 DIAGNOSIS — M25531 Pain in right wrist: Secondary | ICD-10-CM

## 2020-05-14 DIAGNOSIS — M6281 Muscle weakness (generalized): Secondary | ICD-10-CM

## 2020-05-14 DIAGNOSIS — M25641 Stiffness of right hand, not elsewhere classified: Secondary | ICD-10-CM

## 2020-05-14 DIAGNOSIS — M25631 Stiffness of right wrist, not elsewhere classified: Secondary | ICD-10-CM

## 2020-05-14 NOTE — Therapy (Signed)
[place] [facility] [address], [place], [address] Phone: [phone]   Fax:  [phone]  Occupational Therapy Treatment  Patient Details  Name: Kristin Sparks MRN: [id number] Date of Birth: 06-10-1956 Referring Provider (OT): [place], [place]    Encounter Date: 05/14/2020   OT End of Session - 05/14/20 1700    Visit Number 10    Number of Visits 11    Date for OT Re-Evaluation 05/27/20    OT Start Time 1517    OT Stop Time 1554    OT Time Calculation (min) 37 min    Activity Tolerance Patient tolerated treatment well    Behavior During Therapy [facility] for tasks assessed/performed           Past Medical History:  Diagnosis Date  . Allergic rhinitis   . Asthma   . Elevated cholesterol   . Neuropathy     Past Surgical History:  Procedure Laterality Date  . NASAL SINUS SURGERY    . OVARIAN CYST REMOVAL      There were no vitals filed for this visit.   Subjective Assessment - 05/14/20 1659    Subjective  I am working on my flexibility still and used it more in the yard and at home - opening doors, jars - and the putty    Pertinent History Pt fell and fracture her wrist 02/04/2020 , was casted 02/11/2020 - xray showed healing and in velcro splint - refer to OT/hand therapy on 03/19/2020    Patient Stated Goals I want to be able to use my R hand to work out , walk my dog, bike , run and just to things around my house    Currently in Pain? No/denies              [facility] OT Assessment - 05/14/20 0001      AROM   Right Wrist Extension 65 Degrees    Right Wrist Flexion 62 Degrees      Strength   Right Hand Grip (lbs) 25    Right Hand Lateral Pinch 12 lbs    Right Hand 3 Point Pinch 13 lbs    Left Hand Grip (lbs) 55    Left Hand Lateral Pinch 15 lbs    Left Hand 3 Point Pinch 15 lbs             measurements taken - cont to make progress in grip and wrist AROM        OT Treatments/Exercises (OP)  - 05/14/20 0001      RUE Paraffin   Number Minutes Paraffin 8 Minutes    RUE Paraffin Location Wrist    Comments wrist flexion stretch 3 x 2 min             AAROM for wrist flexion , ext - done by OT 10 reps each  Flexion increase after heat and  AAROM to 70 degrees  Cont withtable slides for pt for wrist extention- 20 reps - done great no pain   pt to cont doing at home using heating pad - 3 x 2 min - prior to table AAROM for wrist flexion , extention - pt to do double time on wrist flexion  And then done and add wall pushups to HEP - 12 reps - no pain - to do after table slides And can cont with 5 lbs weight for elbow flexion, ext, punches for conditioning  cont to use hand more  in ADL's and IADL's Can do putty for grip - if not done a lot in yard       OT Education - 05/14/20 1700    Education Details HEP changes to do for 2 wks    Person(s) Educated Patient    Methods Explanation;Demonstration;Tactile cues;Verbal cues;Handout    Comprehension Verbal cues required;Returned demonstration;Verbalized understanding            OT Short Term Goals - 05/01/20 1133      OT SHORT TERM GOAL #1   Title Pt to be independent in HEP to decrease edema and pain - to do composite fist and increase wrist AROM in all planes    Status Achieved             OT Long Term Goals - 05/01/20 1133      OT LONG TERM GOAL #1   Title R wrist AROM improve to [facility] to use hand in turning doorknob, use hand in more than 75% of ADL's    Baseline using hand more - but wrist flexion  decrease still at 40 -    Time 4    Period Weeks    Status On-going    Target Date 05/27/20      OT LONG TERM GOAL #2   Title R grip and prehension strength increase with more than 50% compar to L to carry more than 5 lbs, bike at gym and walk dog    Baseline R 21, L 55 lbs    Time 4    Period Weeks    Status On-going    Target Date 05/27/20      OT LONG TERM GOAL #3   Title R wrist strength increase  to more than 4+/5 to improve function on PRWHE by 20 points    Baseline -PRWHE function at eval 39/50 - need to assess next time PRWHE- strength in wrist AROM  plane 4+/5    Time 4    Period Weeks    Status On-going    Target Date 05/27/20                 Plan - 05/14/20 1701    Clinical Impression Statement Pt is 15 wks out from R distal radius fx - pt show increase functional use , grip strength and wrist flexion , ext - pt to cont with HEP for wrist ext , flexion stretches , grip strength , wall pushups and increase functional use - will follow up in 2 wks to assess if progressing with HEP on her own    OT Occupational Profile and History Problem Focused Assessment - Including review of records relating to presenting problem    Occupational performance deficits (Please refer to evaluation for details): ADL's;IADL's;Play;Leisure;Social Participation    Body Structure / Function / Physical Skills ADL;Edema;Decreased knowledge of precautions;Flexibility;ROM;UE functional use;Strength;Pain;IADL    Rehab Potential Good    Clinical Decision Making Limited treatment options, no task modification necessary    Comorbidities Affecting Occupational Performance: May have comorbidities impacting occupational performance    Modification or Assistance to Complete Evaluation  No modification of tasks or assist necessary to complete eval    OT Frequency Biweekly    OT Duration 2 weeks    OT Treatment/Interventions Self-care/ADL training;Patient/family education;Splinting;Paraffin;Fluidtherapy;Contrast Bath;Manual Therapy;Passive range of motion    Plan progress assess    OT Home Exercise Plan see pt instruction    Consulted and Agree with Plan of Care Patient  Patient will benefit from skilled therapeutic intervention in order to improve the following deficits and impairments:   Body Structure / Function / Physical Skills: ADL, Edema, Decreased knowledge of precautions, Flexibility,  ROM, UE functional use, Strength, Pain, IADL       Visit Diagnosis: Stiffness of right hand, not elsewhere classified  Muscle weakness (generalized)  Stiffness of right wrist, not elsewhere classified  Pain in right wrist  Localized edema    Problem List Patient Active Problem List   Diagnosis Date Noted  . Generalized anxiety disorder 07/12/2018  . Severe recurrent major depression without psychotic features ([place]) 07/12/2018  . Rosacea 06/08/2017  . Left knee pain 06/08/2017  . Non-seasonal allergic rhinitis due to pollen 12/09/2015  . Pure hypercholesterolemia 12/09/2015  . Idiopathic sensorimotor axonal neuropathy 06/10/2015  . Asthma 08/19/2014    Rosalyn Gess OTR/l,CLT 05/14/2020, 5:03 PM  [place] [facility] [address], [place], [address] Phone: [phone]   Fax:  [phone]  Name: LEEBA BARBE MRN: [id number] Date of Birth: Apr 25, 1956

## 2020-05-27 ENCOUNTER — Ambulatory Visit (INDEPENDENT_AMBULATORY_CARE_PROVIDER_SITE_OTHER): Payer: Medicare Other | Admitting: Psychology

## 2020-05-27 DIAGNOSIS — F4323 Adjustment disorder with mixed anxiety and depressed mood: Secondary | ICD-10-CM

## 2020-05-27 DIAGNOSIS — F411 Generalized anxiety disorder: Secondary | ICD-10-CM

## 2020-05-27 DIAGNOSIS — F321 Major depressive disorder, single episode, moderate: Secondary | ICD-10-CM

## 2020-05-28 ENCOUNTER — Other Ambulatory Visit: Payer: Self-pay

## 2020-05-28 ENCOUNTER — Other Ambulatory Visit: Payer: Medicare Other

## 2020-05-28 DIAGNOSIS — Z20822 Contact with and (suspected) exposure to covid-19: Secondary | ICD-10-CM

## 2020-05-29 ENCOUNTER — Ambulatory Visit: Payer: Medicare Other | Attending: Orthopedic Surgery | Admitting: Occupational Therapy

## 2020-05-29 ENCOUNTER — Other Ambulatory Visit: Payer: Self-pay

## 2020-05-29 DIAGNOSIS — M25531 Pain in right wrist: Secondary | ICD-10-CM

## 2020-05-29 DIAGNOSIS — R600 Localized edema: Secondary | ICD-10-CM | POA: Diagnosis present

## 2020-05-29 DIAGNOSIS — M6281 Muscle weakness (generalized): Secondary | ICD-10-CM

## 2020-05-29 DIAGNOSIS — M25641 Stiffness of right hand, not elsewhere classified: Secondary | ICD-10-CM

## 2020-05-29 DIAGNOSIS — M25631 Stiffness of right wrist, not elsewhere classified: Secondary | ICD-10-CM

## 2020-05-29 NOTE — Therapy (Signed)
[facility] [address] [place], [address] Phone: [phone]   Fax:  [phone]  Occupational Therapy Treatment  Patient Details  Name: Kristin Sparks MRN: [id number] Date of Birth: 06-17-1956 Referring Provider (OT): [place], [place]    Encounter Date: 05/29/2020   OT End of Session - 05/29/20 1152    Visit Number 11    Number of Visits 12    Date for OT Re-Evaluation 07/10/20    OT Start Time 1000    OT Stop Time 1033    OT Time Calculation (min) 33 min    Activity Tolerance Patient tolerated treatment well    Behavior During Therapy [facility] for tasks assessed/performed           Past Medical History:  Diagnosis Date  . Allergic rhinitis   . Asthma   . Elevated cholesterol   . Neuropathy     Past Surgical History:  Procedure Laterality Date  . NASAL SINUS SURGERY    . OVARIAN CYST REMOVAL      There were no vitals filed for this visit.   Subjective Assessment - 05/29/20 1150    Subjective  I am using my hand in most everything - running little again , using it in yard , opening doors, picking up luggage - just stiff still in the am - did not work out in gym yet    Pertinent History Pt fell and fracture her wrist 02/04/2020 , was casted 02/11/2020 - xray showed healing and in velcro splint - refer to OT/hand therapy on 03/19/2020    Patient Stated Goals I want to be able to use my R hand to work out , walk my dog, bike , run and just to things around my house    Currently in Pain? No/denies              [facility] OT Assessment - 05/29/20 0001      AROM   Right Wrist Extension 70 Degrees    Right Wrist Flexion 65 Degrees      Strength   Right Hand Grip (lbs) 35    Right Hand Lateral Pinch 14 lbs    Right Hand 3 Point Pinch 14 lbs    Left Hand Grip (lbs) 55    Left Hand Lateral Pinch 15 lbs    Left Hand 3 Point Pinch 15 lbs          Pt showed increase AROM for flexion and extention at R  wrist- report using her hand more in every day activities without thinking - yard work , running little  But still afraid to go to the gym  Review again for her to work on stretches or  AAROM for wrist flexion , ext  Use heat if needed  AND do table slides for wrist extention- 20 reps - prior to wall pushups pt to cont doing at home using heating pad - 3 x 2 min - prior to table AAROM for wrist flexion , extention - pt to do double time on wrist flexion   wall pushups 12 reps - no pain - to do after table slides And can cont with 5 lbs weight for elbow flexion, ext, punches for conditioning Done [employer] - pull downs, triceps bar, rowing and pect - 20 lbs  15 reps - no issues or pain  Pt feel more comfortable going back to the gym  cont to use hand more in ADL's and  IADL's  Can return in 4-6 wks for follow up and reassessment if she wants                   OT Education - 05/29/20 1151    Education Details HEP review to do for about 6 wks and can return if want to    [facility]) Educated Patient    Methods Explanation;Demonstration;Tactile cues;Verbal cues;Handout    Comprehension Verbal cues required;Returned demonstration;Verbalized understanding            OT Short Term Goals - 05/29/20 1157      OT SHORT TERM GOAL #1   Title Pt to be independent in HEP to decrease edema and pain - to do composite fist and increase wrist AROM in all planes    Status Achieved             OT Long Term Goals - 05/29/20 1157      OT LONG TERM GOAL #1   Title R wrist AROM improve to [facility] to use hand in turning doorknob, use hand in more than 75% of ADL's    Baseline increase to 70 ext, flexion 68 - using hand in all ADL's    Status Achieved      OT LONG TERM GOAL #2   Title R grip and prehension strength increase with more than 50% compar to L to carry more than 5 lbs, bike at gym and walk dog    Baseline R 21, L 55 lbs and NOW R 35, L 55 lbs - she is running but not biking  and did not go the gym yet - was afraid    Time 6    Period Weeks    Status On-going    Target Date 07/10/20      OT LONG TERM GOAL #3   Title R wrist strength increase to more than 4+/5 to improve function on PRWHE by 20 points    Baseline -PRWHE function at eval 39/50 - now 5/50 - strength 5/5 - decrease flexion of wrist, decrease PROM end range extention - pull wiht pushup on wall    Time 6    Period Weeks    Status On-going    Target Date 07/10/20                 Plan - 05/29/20 1152    Clinical Impression Statement Pt is about 3 1/2 months out from R distal radius fx -cont to made progress the last 2 wks in her AROM at wrist and grip increase 10 lbs from just doing her stretches and using her hand more - pt to cont with stretches for wrist flexion , ext - and then increase use - review some workout machine to do in the gym - pt wants to return in 4-6 wks for reassesent - pt to return or call to cx if feeling she is doing well    OT Occupational Profile and History Problem Focused Assessment - Including review of records relating to presenting problem    Occupational performance deficits (Please refer to evaluation for details): ADL's;IADL's;Play;Leisure;Social Participation    Body Structure / Function / Physical Skills ADL;Edema;Decreased knowledge of precautions;Flexibility;ROM;UE functional use;Strength;Pain;IADL    Rehab Potential Good    Clinical Decision Making Limited treatment options, no task modification necessary    Comorbidities Affecting Occupational Performance: May have comorbidities impacting occupational performance    Modification or Assistance to Complete Evaluation  No modification of tasks or assist necessary  to complete eval    OT Frequency --   6 wks   OT Duration 6 weeks    OT Treatment/Interventions Self-care/ADL training;Patient/family education;Splinting;Paraffin;Fluidtherapy;Contrast Bath;Manual Therapy;Passive range of motion    Plan pt to return  in 4-6 wks - calll if do not need to    Consulted and Agree with Plan of Care Patient           Patient will benefit from skilled therapeutic intervention in order to improve the following deficits and impairments:   Body Structure / Function / Physical Skills: ADL, Edema, Decreased knowledge of precautions, Flexibility, ROM, UE functional use, Strength, Pain, IADL       Visit Diagnosis: Stiffness of right hand, not elsewhere classified - Plan: Ot plan of care cert/re-cert  Muscle weakness (generalized) - Plan: Ot plan of care cert/re-cert  Stiffness of right wrist, not elsewhere classified - Plan: Ot plan of care cert/re-cert  Pain in right wrist - Plan: Ot plan of care cert/re-cert  Localized edema - Plan: Ot plan of care cert/re-cert    Problem List Patient Active Problem List   Diagnosis Date Noted  . Generalized anxiety disorder 07/12/2018  . Severe recurrent major depression without psychotic features ([place]) 07/12/2018  . Rosacea 06/08/2017  . Left knee pain 06/08/2017  . Non-seasonal allergic rhinitis due to pollen 12/09/2015  . Pure hypercholesterolemia 12/09/2015  . Idiopathic sensorimotor axonal neuropathy 06/10/2015  . Asthma 08/19/2014    Rosalyn Gess OTR/L,CLT 05/29/2020, 12:03 PM  [facility] [address] [place], [address] Phone: [phone]   Fax:  [phone]  Name: KAIRA STRINGFIELD MRN: [id number] Date of Birth: October 17, 1955

## 2020-05-30 LAB — NOVEL CORONAVIRUS, NAA: SARS-CoV-2, NAA: NOT DETECTED

## 2020-06-10 ENCOUNTER — Ambulatory Visit (INDEPENDENT_AMBULATORY_CARE_PROVIDER_SITE_OTHER): Payer: Medicare Other | Admitting: Psychology

## 2020-06-10 DIAGNOSIS — F411 Generalized anxiety disorder: Secondary | ICD-10-CM | POA: Diagnosis not present

## 2020-06-10 DIAGNOSIS — F321 Major depressive disorder, single episode, moderate: Secondary | ICD-10-CM | POA: Diagnosis not present

## 2020-06-10 DIAGNOSIS — F4323 Adjustment disorder with mixed anxiety and depressed mood: Secondary | ICD-10-CM

## 2020-06-23 ENCOUNTER — Other Ambulatory Visit: Payer: Self-pay

## 2020-06-23 ENCOUNTER — Ambulatory Visit (INDEPENDENT_AMBULATORY_CARE_PROVIDER_SITE_OTHER): Payer: Medicare Other | Admitting: Psychiatry

## 2020-06-23 ENCOUNTER — Encounter: Payer: Self-pay | Admitting: Psychiatry

## 2020-06-23 VITALS — BP 118/72 | HR 48

## 2020-06-23 DIAGNOSIS — F3341 Major depressive disorder, recurrent, in partial remission: Secondary | ICD-10-CM

## 2020-06-23 DIAGNOSIS — G4700 Insomnia, unspecified: Secondary | ICD-10-CM

## 2020-06-23 DIAGNOSIS — F411 Generalized anxiety disorder: Secondary | ICD-10-CM

## 2020-06-23 MED ORDER — PROPRANOLOL HCL 10 MG PO TABS: ORAL_TABLET | ORAL | 1 refills | Status: DC

## 2020-06-23 MED ORDER — DULOXETINE HCL 20 MG PO CPEP: ORAL_CAPSULE | ORAL | 3 refills | Status: DC

## 2020-06-23 MED ORDER — DULOXETINE HCL 60 MG PO CPEP: ORAL_CAPSULE | ORAL | 3 refills | Status: DC

## 2020-06-23 MED ORDER — BUSPIRONE HCL 30 MG PO TABS: 30.0000 mg | ORAL_TABLET | Freq: Two times a day (BID) | ORAL | 1 refills | Status: DC

## 2020-06-23 NOTE — Progress Notes (Signed)
Kristin Sparks [id number] 02-02-1956 64 y.o.  Subjective:   Patient ID:  Kristin Sparks is a 64 y.o. (DOB [birth year]) female.  Chief Complaint:  Chief Complaint  Patient presents with  . Anxiety  . Depression    HPI Kristin Sparks presents to the office today for follow-up of depression. She reports "I think the medicine is helping with the depression." She is continuing to grieve the loss of her husband who died in 01/26/23.   She notices hot flashes on Cymbalta 80 mg, particularly in stressful situations and when she is anxious. She reports that 80 mg is more effective for her mood and anxiety s/s than the 60 mg dose. Reports that she does not experience hot flashes when she is not anxious. Episodes of anxiety/hot flashes occur about twice a week, typically around others. She reports that she has anxiety around some people. Had anticipatory anxiety for about 3 weeks with a nightmare leading up to [place] with son's girlfriend and her mother. She reports that certain patterns trigger her anxiety. Sleeping ok. Appetite has been normal. Energy and motivation have improved in the last month. She reports difficulty with concentration and has not read books that she would like to read and instead tends to read shorter articles. Denies SI.  Husband died in 01-26-2023. Visiting sister frequently and has been to the beach several times with her. Son is completing PA school and is graduating in December. He has been staying with her some and this has been helpful for her.   Continues to run.  Continues to see [name], LCSW for therapy.    Past medication trials: Sertraline-diarrhea. Felt calmer. Lexapro-prescribed for hot flashes. Had increased anxiety while taking. Effexor XR-some benefit and then was less effective. Signs and symptoms inadequately controlled at 75 mg and unable to tolerate higher doses. Cymbalta Buspar Propanolol Ativan Hydroxyzine Lyrica Gabapentin-did not help  with neuropathy   Review of Systems:  Review of Systems  Constitutional: Positive for diaphoresis.  Cardiovascular:       Had cardiac work-up which was WNL. Cardiologist said that palpitations may be related to dehydration and was advised to stay hydrated. She reports that palpitations have improved with adequate hydration.   Musculoskeletal: Negative for gait problem.  Neurological: Negative for tremors.  Psychiatric/Behavioral:       Please refer to HPI    Medications: I have reviewed the patient's current medications.  Current Outpatient Medications  Medication Sig Dispense Refill  . acetaminophen (TYLENOL) 325 MG tablet Take 650 mg by mouth every 6 (six) hours as needed.    Marland Kitchen albuterol (PROVENTIL HFA;VENTOLIN HFA) 108 (90 Base) MCG/ACT inhaler Inhale into the lungs.    . busPIRone (BUSPAR) 30 MG tablet Take 1 tablet (30 mg total) by mouth 2 (two) times daily. 180 tablet 1  . calcium-vitamin D (SM CALCIUM 500/VITAMIN D3) 500-400 MG-UNIT tablet Take by mouth.    . DULoxetine (CYMBALTA) 20 MG capsule TAKE 1 CAPSULE BY MOUTH  DAILY , WITH A 60 MG  CAPSULE TO EQUAL 80 MG  TOTAL DOSE 90 capsule 3  . DULoxetine (CYMBALTA) 60 MG capsule TAKE 1 CAPSULE BY MOUTH  DAILY , WITH A 20 MG  CAPSULE TO EQUAL 80 MG  DAILY 90 capsule 3  . Fluticasone-Salmeterol (ADVAIR DISKUS) 250-50 MCG/DOSE AEPB Inhale into the lungs.    . hydrOXYzine (ATARAX/VISTARIL) 10 MG tablet Take 1 tablet (10 mg total) by mouth at bedtime as needed for up to 30 days. [place]  tablet 2  . levocetirizine (XYZAL) 5 MG tablet Take 5 mg by mouth daily as needed.     . lovastatin (MEVACOR) 20 MG tablet Take 20 mg by mouth once a week.     . meloxicam (MOBIC) 7.5 MG tablet Take 7.5 mg by mouth daily.    . Omega-3 1000 MG CAPS Take by mouth.    . pregabalin (LYRICA) 75 MG capsule Take 1 capsule (75 mg total) by mouth every evening. 90 capsule 1  . propranolol (INDERAL) 10 MG tablet Take 1/2-1 tab po BID prn anxiety (Do not use if pulse  is <70) 60 tablet 1   No current facility-administered medications for this visit.    Medication Side Effects: Other: Sweating when anxious  Allergies:  Allergies  Allergen Reactions  . Latex   . Sulfa Antibiotics Rash    Past Medical History:  Diagnosis Date  . Allergic rhinitis   . Asthma   . Elevated cholesterol   . Neuropathy     Family History  Problem Relation Age of Onset  . Alcohol abuse Father   . Depression Sister     Social History   Socioeconomic History  . Marital status: Married    Spouse name: Not on file  . Number of children: Not on file  . Years of education: Not on file  . Highest education level: Not on file  Occupational History  . Not on file  Tobacco Use  . Smoking status: Never Smoker  . Smokeless tobacco: Never Used  Substance and Sexual Activity  . Alcohol use: Not on file  . Drug use: Not on file  . Sexual activity: Not on file  Other Topics Concern  . Not on file  Social History Narrative  . Not on file   Social Determinants of Health   Financial Resource Strain:   . Difficulty of Paying Living Expenses: Not on file  Food Insecurity:   . Worried About Charity fundraiser in the Last Year: Not on file  . Ran Out of Food in the Last Year: Not on file  Transportation Needs:   . Lack of Transportation (Medical): Not on file  . Lack of Transportation (Non-Medical): Not on file  Physical Activity:   . Days of Exercise per Week: Not on file  . Minutes of Exercise per Session: Not on file  Stress:   . Feeling of Stress : Not on file  Social Connections:   . Frequency of Communication with Friends and Family: Not on file  . Frequency of Social Gatherings with Friends and Family: Not on file  . Attends Religious Services: Not on file  . Active Member of Clubs or Organizations: Not on file  . Attends Archivist Meetings: Not on file  . Marital Status: Not on file  Intimate Partner Violence:   . Fear of Current or  Ex-Partner: Not on file  . Emotionally Abused: Not on file  . Physically Abused: Not on file  . Sexually Abused: Not on file    Past Medical History, Surgical history, Social history, and Family history were reviewed and updated as appropriate.   Please see review of systems for further details on the patient's review from today.   Objective:   Physical Exam:  BP 118/72   Pulse (!) 48   Physical Exam Constitutional:      General: She is not in acute distress. Musculoskeletal:        General: No deformity.  Neurological:  Mental Status: She is alert and oriented to person, place, and time.     Coordination: Coordination normal.  Psychiatric:        Attention and Perception: Attention and perception normal. She does not perceive auditory or visual hallucinations.        Mood and Affect: Mood is anxious. Mood is not depressed. Affect is not labile, blunt, angry or inappropriate.        Speech: Speech normal.        Behavior: Behavior normal.        Thought Content: Thought content normal. Thought content is not paranoid or delusional. Thought content does not include homicidal or suicidal ideation. Thought content does not include homicidal or suicidal plan.        Cognition and Memory: Cognition and memory normal.        Judgment: Judgment normal.     Comments: Insight intact     Lab Review:  No results found for: NA, K, CL, CO2, GLUCOSE, BUN, CREATININE, CALCIUM, PROT, ALBUMIN, AST, ALT, ALKPHOS, BILITOT, GFRNONAA, GFRAA  No results found for: WBC, RBC, HGB, HCT, PLT, MCV, MCH, MCHC, RDW, LYMPHSABS, MONOABS, EOSABS, BASOSABS  No results found for: POCLITH, LITHIUM   No results found for: PHENYTOIN, PHENOBARB, VALPROATE, CBMZ   .res Assessment: Plan:   Discussed re-starting Propranolol prn social anxiety and acute anxiety with sweating since she took Propranolol prn in years past with good response. Discussed that sweating seems to be related to her anxiety vs.  medication side effect only since sweating only occurs with anxiety and that higher dose of Cymbalta seems to make her more susceptible to sweating. Discussed that Propranolol can lower heart rate and not to take Propranolol prn if heart rate is less than 65.  Will start Propranolol 10 mg 1/2-1 tab po BID prn severe anxiety.  Continue Cymbalta 20 mg and 60 mg capsules to equal total daily dose of 80 mg po qd for depression and anxiety.  Continue Buspar 30 mg po BID anxiety.  Pt to follow-up in 4 months or sooner if clinically indicated.  Patient advised to contact office with any questions, adverse effects, or acute worsening in signs and symptoms.  Kristin Sparks was seen today for anxiety and depression.  Diagnoses and all orders for this visit:  Generalized anxiety disorder -     Discontinue: propranolol (INDERAL) 10 MG tablet; Take 1/2-1 tab po BID prn anxiety (Do not use if pulse is <70) -     busPIRone (BUSPAR) 30 MG tablet; Take 1 tablet (30 mg total) by mouth 2 (two) times daily. -     DULoxetine (CYMBALTA) 20 MG capsule; TAKE 1 CAPSULE BY MOUTH  DAILY , WITH A 60 MG  CAPSULE TO EQUAL 80 MG  TOTAL DOSE -     DULoxetine (CYMBALTA) 60 MG capsule; TAKE 1 CAPSULE BY MOUTH  DAILY , WITH A 20 MG  CAPSULE TO EQUAL 80 MG  DAILY -     propranolol (INDERAL) 10 MG tablet; Take 1/2-1 tab po BID prn anxiety (Do not use if pulse is <70)  Insomnia, unspecified type  Depression, major, recurrent, in partial remission ([place])     Please see After Visit Summary for patient specific instructions.  Future Appointments  Date Time Provider [place]  06/24/2020  9:00 AM Cottle, Lucious Groves, LCSW [facility] None  07/08/2020  9:00 AM Cottle, Bambi G, LCSW [facility] None  07/10/2020 11:15 AM Rosalyn Gess, OT [facility] None  10/23/2020 10:00 AM Eulas Post,  Janett Billow, PMHNP [facility] None    No orders of the defined types were placed in this encounter.   -------------------------------

## 2020-06-24 ENCOUNTER — Ambulatory Visit (INDEPENDENT_AMBULATORY_CARE_PROVIDER_SITE_OTHER): Payer: Medicare Other | Admitting: Psychology

## 2020-06-24 DIAGNOSIS — F321 Major depressive disorder, single episode, moderate: Secondary | ICD-10-CM | POA: Diagnosis not present

## 2020-06-24 DIAGNOSIS — F4323 Adjustment disorder with mixed anxiety and depressed mood: Secondary | ICD-10-CM

## 2020-06-24 DIAGNOSIS — F411 Generalized anxiety disorder: Secondary | ICD-10-CM | POA: Diagnosis not present

## 2020-07-08 ENCOUNTER — Ambulatory Visit (INDEPENDENT_AMBULATORY_CARE_PROVIDER_SITE_OTHER): Payer: Medicare Other | Admitting: Psychology

## 2020-07-08 DIAGNOSIS — F411 Generalized anxiety disorder: Secondary | ICD-10-CM

## 2020-07-08 DIAGNOSIS — F4323 Adjustment disorder with mixed anxiety and depressed mood: Secondary | ICD-10-CM

## 2020-07-08 DIAGNOSIS — F321 Major depressive disorder, single episode, moderate: Secondary | ICD-10-CM | POA: Diagnosis not present

## 2020-07-10 ENCOUNTER — Encounter: Payer: Medicare Other | Admitting: Occupational Therapy

## 2020-07-17 ENCOUNTER — Other Ambulatory Visit: Payer: Self-pay

## 2020-07-17 ENCOUNTER — Ambulatory Visit: Payer: Medicare Other | Attending: Orthopedic Surgery | Admitting: Occupational Therapy

## 2020-07-17 DIAGNOSIS — M25531 Pain in right wrist: Secondary | ICD-10-CM | POA: Insufficient documentation

## 2020-07-17 DIAGNOSIS — M6281 Muscle weakness (generalized): Secondary | ICD-10-CM | POA: Diagnosis not present

## 2020-07-17 DIAGNOSIS — M25641 Stiffness of right hand, not elsewhere classified: Secondary | ICD-10-CM | POA: Insufficient documentation

## 2020-07-17 DIAGNOSIS — M25631 Stiffness of right wrist, not elsewhere classified: Secondary | ICD-10-CM | POA: Diagnosis present

## 2020-07-17 DIAGNOSIS — R600 Localized edema: Secondary | ICD-10-CM | POA: Insufficient documentation

## 2020-07-17 NOTE — Therapy (Signed)
[facility] [address] [place], [address] Phone: [phone]   Fax:  [phone]  Occupational Therapy discharge  Patient Details  Name: Kristin Sparks MRN: [id number] Date of Birth: 1956/08/23 Referring Provider (OT): [place], [place]    Encounter Date: 07/17/2020   OT End of Session - 07/17/20 1221    Visit Number 12    Number of Visits 12    Date for OT Re-Evaluation 07/17/20    OT Start Time 1150    OT Stop Time 1214    OT Time Calculation (min) 24 min    Activity Tolerance Patient tolerated treatment well    Behavior During Therapy [facility] for tasks assessed/performed           Past Medical History:  Diagnosis Date  . Allergic rhinitis   . Asthma   . Elevated cholesterol   . Neuropathy     Past Surgical History:  Procedure Laterality Date  . NASAL SINUS SURGERY    . OVARIAN CYST REMOVAL      There were no vitals filed for this visit.   Subjective Assessment - 07/17/20 1219    Subjective  I seen you about 7 wks ago - using my hands in most everything - still doing the weight for upper body at home , wall pushups and running with my group of friens - feel here and there still pain in wrist and did not try pushups yet    Pertinent History Pt fell and fracture her wrist 02/04/2020 , was casted 02/11/2020 - xray showed healing and in velcro splint - refer to OT/hand therapy on 03/19/2020    Patient Stated Goals I want to be able to use my R hand to work out , walk my dog, bike , run and just to things around my house    Currently in Pain? No/denies              [facility] OT Assessment - 07/17/20 0001      AROM   Right Wrist Extension 70 Degrees    Right Wrist Flexion 65 Degrees      Strength   Right Hand Grip (lbs) 36    Right Hand Lateral Pinch 15 lbs    Right Hand 3 Point Pinch 14 lbs    Left Hand Grip (lbs) 55    Left Hand Lateral Pinch 15 lbs    Left Hand 3 Point Pinch 15 lbs               Pt return for check up after 7 wks -of using her hand , starting running again and doing her HEP for stretches and strengthening Wrist extention for weight bearing still limited  Review again for her to work on stretches or  AAROM for wrist flexion , ext  Use heat if needed  AND do table slides for wrist extention- 20 reps - prior to wall pushups Can don't with  5 lbs weight for elbow flexion, ext, punches for conditioning Pt still on back to gym - prefer working out her routine at home -and doing some balance , squads and lunges  At home  Reinforce that will take 6 months to year to recover from injury                  OT Education - 07/17/20 1221    Education Details discharge instructions    Person(s) Educated Patient    Methods Explanation;Demonstration;Tactile cues;Verbal  cues;Handout    Comprehension Verbal cues required;Returned demonstration;Verbalized understanding            OT Short Term Goals - 05/29/20 1157      OT SHORT TERM GOAL #1   Title Pt to be independent in HEP to decrease edema and pain - to do composite fist and increase wrist AROM in all planes    Status Achieved             OT Long Term Goals - 07/17/20 1225      OT LONG TERM GOAL #1   Title R wrist AROM improve to [facility] to use hand in turning doorknob, use hand in more than 75% of ADL's    Status Achieved      OT LONG TERM GOAL #2   Title R grip and prehension strength increase with more than 50% compar to L to carry more than 5 lbs, bike at gym and walk dog    Status Achieved      OT LONG TERM GOAL #3   Title R wrist strength increase to more than 4+/5 to improve function on PRWHE by 20 points    Status Achieved                 Plan - 07/17/20 1222    Clinical Impression Statement Pt is just over 5 months out from R distal radius fx - pt using her hand and wrist in most every thing , yard work , carrying heavy objects- only thing that still bother her is wall  pushups - pt cont to be at 70 AROM wrist ext, and 65 flexion - pt to cont to focus on stretches for end range extention to make pushups more confortable , and flexion stretch - but otherwise doing great - will take 6 months to year - pt back to running with her friends - pt discharge from [place] and History Problem Focused Assessment - Including review of records relating to presenting problem    Occupational performance deficits (Please refer to evaluation for details): ADL's;IADL's;Play;Leisure;Social Participation    Body Structure / Function / Physical Skills ADL;Edema;Decreased knowledge of precautions;Flexibility;ROM;UE functional use;Strength;Pain;IADL    Rehab Potential Good    Clinical Decision Making Limited treatment options, no task modification necessary    Comorbidities Affecting Occupational Performance: May have comorbidities impacting occupational performance    Modification or Assistance to Complete Evaluation  No modification of tasks or assist necessary to complete eval    OT Treatment/Interventions Self-care/ADL training;Patient/family education;Splinting;Paraffin;Fluidtherapy;Contrast Bath;Manual Therapy;Passive range of motion    OT Home Exercise Plan see pt instruction    Consulted and Agree with Plan of Care Patient           Patient will benefit from skilled therapeutic intervention in order to improve the following deficits and impairments:   Body Structure / Function / Physical Skills: ADL, Edema, Decreased knowledge of precautions, Flexibility, ROM, UE functional use, Strength, Pain, IADL       Visit Diagnosis: Muscle weakness (generalized) - Plan: Ot plan of care cert/re-cert  Stiffness of right wrist, not elsewhere classified - Plan: Ot plan of care cert/re-cert  Pain in right wrist - Plan: Ot plan of care cert/re-cert  Localized edema - Plan: Ot plan of care cert/re-cert  Stiffness of right hand, not elsewhere classified - Plan: Ot  plan of care cert/re-cert    Problem List Patient Active Problem List   Diagnosis Date Noted  .  Generalized anxiety disorder 07/12/2018  . Severe recurrent major depression without psychotic features ([place]) 07/12/2018  . Rosacea 06/08/2017  . Left knee pain 06/08/2017  . Non-seasonal allergic rhinitis due to pollen 12/09/2015  . Pure hypercholesterolemia 12/09/2015  . Idiopathic sensorimotor axonal neuropathy 06/10/2015  . Asthma 08/19/2014    Rosalyn Gess OTR/L,CLT 07/17/2020, 12:28 PM  [place] [address], [place], [address] Phone: [phone]   Fax:  [phone]  Name: TAMIYAH MOULIN MRN: [id number] Date of Birth: 10/26/1955

## 2020-07-22 ENCOUNTER — Ambulatory Visit (INDEPENDENT_AMBULATORY_CARE_PROVIDER_SITE_OTHER): Payer: Medicare Other | Admitting: Psychology

## 2020-07-22 DIAGNOSIS — F321 Major depressive disorder, single episode, moderate: Secondary | ICD-10-CM | POA: Diagnosis not present

## 2020-07-22 DIAGNOSIS — F4320 Adjustment disorder, unspecified: Secondary | ICD-10-CM | POA: Diagnosis not present

## 2020-07-22 DIAGNOSIS — F411 Generalized anxiety disorder: Secondary | ICD-10-CM

## 2020-08-05 ENCOUNTER — Ambulatory Visit (INDEPENDENT_AMBULATORY_CARE_PROVIDER_SITE_OTHER): Payer: Medicare Other | Admitting: Psychology

## 2020-08-05 DIAGNOSIS — F411 Generalized anxiety disorder: Secondary | ICD-10-CM | POA: Diagnosis not present

## 2020-08-05 DIAGNOSIS — F321 Major depressive disorder, single episode, moderate: Secondary | ICD-10-CM | POA: Diagnosis not present

## 2020-08-05 DIAGNOSIS — F4320 Adjustment disorder, unspecified: Secondary | ICD-10-CM

## 2020-08-19 ENCOUNTER — Ambulatory Visit (INDEPENDENT_AMBULATORY_CARE_PROVIDER_SITE_OTHER): Payer: Medicare Other | Admitting: Psychology

## 2020-08-19 DIAGNOSIS — F321 Major depressive disorder, single episode, moderate: Secondary | ICD-10-CM | POA: Diagnosis not present

## 2020-08-19 DIAGNOSIS — F4320 Adjustment disorder, unspecified: Secondary | ICD-10-CM

## 2020-08-19 DIAGNOSIS — F411 Generalized anxiety disorder: Secondary | ICD-10-CM

## 2020-09-02 ENCOUNTER — Ambulatory Visit (INDEPENDENT_AMBULATORY_CARE_PROVIDER_SITE_OTHER): Payer: Medicare Other | Admitting: Psychology

## 2020-09-02 DIAGNOSIS — F411 Generalized anxiety disorder: Secondary | ICD-10-CM

## 2020-09-02 DIAGNOSIS — F4323 Adjustment disorder with mixed anxiety and depressed mood: Secondary | ICD-10-CM | POA: Diagnosis not present

## 2020-09-02 DIAGNOSIS — F321 Major depressive disorder, single episode, moderate: Secondary | ICD-10-CM

## 2020-09-16 ENCOUNTER — Ambulatory Visit (INDEPENDENT_AMBULATORY_CARE_PROVIDER_SITE_OTHER): Payer: Medicare Other | Admitting: Psychology

## 2020-09-16 DIAGNOSIS — F321 Major depressive disorder, single episode, moderate: Secondary | ICD-10-CM

## 2020-09-16 DIAGNOSIS — F411 Generalized anxiety disorder: Secondary | ICD-10-CM

## 2020-09-16 DIAGNOSIS — F4323 Adjustment disorder with mixed anxiety and depressed mood: Secondary | ICD-10-CM | POA: Diagnosis not present

## 2020-09-30 ENCOUNTER — Ambulatory Visit (INDEPENDENT_AMBULATORY_CARE_PROVIDER_SITE_OTHER): Payer: Medicare Other | Admitting: Psychology

## 2020-09-30 DIAGNOSIS — F411 Generalized anxiety disorder: Secondary | ICD-10-CM | POA: Diagnosis not present

## 2020-09-30 DIAGNOSIS — F321 Major depressive disorder, single episode, moderate: Secondary | ICD-10-CM

## 2020-09-30 DIAGNOSIS — F4323 Adjustment disorder with mixed anxiety and depressed mood: Secondary | ICD-10-CM | POA: Diagnosis not present

## 2020-10-14 ENCOUNTER — Ambulatory Visit (INDEPENDENT_AMBULATORY_CARE_PROVIDER_SITE_OTHER): Payer: Medicare Other | Admitting: Psychology

## 2020-10-14 DIAGNOSIS — F321 Major depressive disorder, single episode, moderate: Secondary | ICD-10-CM

## 2020-10-14 DIAGNOSIS — F4323 Adjustment disorder with mixed anxiety and depressed mood: Secondary | ICD-10-CM

## 2020-10-23 ENCOUNTER — Encounter: Payer: Self-pay | Admitting: Psychiatry

## 2020-10-23 ENCOUNTER — Telehealth (INDEPENDENT_AMBULATORY_CARE_PROVIDER_SITE_OTHER): Payer: Medicare Other | Admitting: Psychiatry

## 2020-10-23 DIAGNOSIS — F411 Generalized anxiety disorder: Secondary | ICD-10-CM | POA: Diagnosis not present

## 2020-10-23 MED ORDER — BUSPIRONE HCL 30 MG PO TABS: 30.0000 mg | ORAL_TABLET | Freq: Two times a day (BID) | ORAL | 1 refills | Status: DC

## 2020-10-23 MED ORDER — DULOXETINE HCL 60 MG PO CPEP: ORAL_CAPSULE | ORAL | 3 refills | Status: DC

## 2020-10-23 MED ORDER — DULOXETINE HCL 20 MG PO CPEP: ORAL_CAPSULE | ORAL | 3 refills | Status: DC

## 2020-10-23 NOTE — Progress Notes (Signed)
Kristin Sparks [id number] 1956-04-14 65 y.o.  Virtual Visit via Video Note  I connected with pt @ on 10/23/20 at 10:00 AM EST by a video enabled telemedicine application and verified that I am speaking with the correct person using two identifiers.   I discussed the limitations of evaluation and management by telemedicine and the availability of in person appointments. The patient expressed understanding and agreed to proceed.  I discussed the assessment and treatment plan with the patient. The patient was provided an opportunity to ask questions and all were answered. The patient agreed with the plan and demonstrated an understanding of the instructions.   The patient was advised to call back or seek an in-person evaluation if the symptoms worsen or if the condition fails to improve as anticipated.  I provided 30 minutes of non-face-to-face time during this encounter.  The patient was located at home.  The provider was located at [place].   Thayer Headings, PMHNP   Subjective:   Patient ID:  Kristin Sparks is a 65 y.o. (DOB June 17, 1956) female.  Chief Complaint:  Chief Complaint  Patient presents with  . Anxiety  . Depression    HPI Kristin Sparks presents for follow-up of depression. She reports "I've been doing ok." She reports that she has been staying busy. She has been visiting her sister in [place] and her sister has been visiting her. She reports that her mood has been stable since mid-November. Denies current sad mood. She reports occasional "bouts of sadness" where she experiences grief and this is brief in duration. She reports that she continues to have periods of anxiety and "it's not everyday." She reports anxiety is not associated with grief. She reports that anxiety occurs after certain dreams and fears about her own death. She reports occasional intrusive thoughts about her dying next week or if she goes certain places she will die. She reports that when she  has anxiety she wants to be by herself. She reports that she also worries about her son and sister dying- "Like I am planning their death." She reports that she feels compelled to look after them and check on them. She reports that she is physically tired. She reports that she is experiencing GI s/s again and this tends to occur when she is more anxious. Denies any recent panic s/s. She reports that she used Propranolol prn x 1 after a distressing dreams. No longer anxious about son's girlfriend and her family or thoughts that she would cause difficulties with their relationship. She reports that she has been gradually sleeping later. Sleeping 8.5-9 hours a night, which is about 30-45 minutes longer than usual. Appetite has been ok. She reports that she intentionally lost 10 lbs and then gained 4 lbs over the holidays. She reports that she frequently changes subjects. She reports that she is able to concentrate to read and manage finances. She reports that her energy is low. Denies SI.   Takes Cymbalta 20 mg po q am and 60 mg in the evenings.   Son just completed PA school and he has a job in [place].   Has continued to see Bambi Cottle, LCSW.   Past medication trials: Sertraline-diarrhea. Felt calmer. Lexapro-prescribed for hot flashes. Had increased anxiety while taking. Effexor XR-some benefit and then was less effective. Signs and symptoms inadequately controlled at 75 mg and unable to tolerate higher doses. Cymbalta Buspar Propanolol Ativan Hydroxyzine Lyrica Gabapentin-did not help with neuropathy  Review of Systems:  Review  of Systems  Gastrointestinal: Positive for abdominal pain.  Musculoskeletal: Negative for gait problem.  Neurological: Negative for tremors.  Psychiatric/Behavioral:       Please refer to HPI  Scheduled colonoscopy.   Medications: I have reviewed the patient's current medications.  Current Outpatient Medications  Medication Sig Dispense Refill  .  Multiple Vitamin (MULTIVITAMIN) tablet Take 1 tablet by mouth daily.    Marland Kitchen acetaminophen (TYLENOL) 325 MG tablet Take 650 mg by mouth every 6 (six) hours as needed.    Marland Kitchen albuterol (PROVENTIL HFA;VENTOLIN HFA) 108 (90 Base) MCG/ACT inhaler Inhale into the lungs.    . busPIRone (BUSPAR) 30 MG tablet Take 1 tablet (30 mg total) by mouth 2 (two) times daily. 180 tablet 1  . calcium-vitamin D (SM CALCIUM 500/VITAMIN D3) 500-400 MG-UNIT tablet Take by mouth.    . DULoxetine (CYMBALTA) 20 MG capsule TAKE 1 CAPSULE BY MOUTH  DAILY , WITH A 60 MG  CAPSULE TO EQUAL 80 MG  TOTAL DOSE 90 capsule 3  . DULoxetine (CYMBALTA) 60 MG capsule TAKE 1 CAPSULE BY MOUTH  DAILY , WITH A 20 MG  CAPSULE TO EQUAL 80 MG  DAILY 90 capsule 3  . Fluticasone-Salmeterol (ADVAIR DISKUS) 250-50 MCG/DOSE AEPB Inhale into the lungs.    . hydrOXYzine (ATARAX/VISTARIL) 10 MG tablet Take 1 tablet (10 mg total) by mouth at bedtime as needed for up to 30 days. 30 tablet 2  . levocetirizine (XYZAL) 5 MG tablet Take 5 mg by mouth daily as needed.     . lovastatin (MEVACOR) 20 MG tablet Take 20 mg by mouth once a week.     . meloxicam (MOBIC) 7.5 MG tablet Take 7.5 mg by mouth daily.    . Omega-3 1000 MG CAPS Take by mouth.    . pregabalin (LYRICA) 75 MG capsule Take 1 capsule (75 mg total) by mouth every evening. 90 capsule 1  . propranolol (INDERAL) 10 MG tablet Take 1/2-1 tab po BID prn anxiety (Do not use if pulse is <70) 60 tablet 1   No current facility-administered medications for this visit.    Medication Side Effects: Fatigue  Allergies:  Allergies  Allergen Reactions  . Latex   . Sulfa Antibiotics Rash    Past Medical History:  Diagnosis Date  . Allergic rhinitis   . Asthma   . Elevated cholesterol   . Neuropathy     Family History  Problem Relation Age of Onset  . Alcohol abuse Father   . Depression Sister     Social History   Socioeconomic History  . Marital status: Widowed    Spouse name: Not on file   . Number of children: Not on file  . Years of education: Not on file  . Highest education level: Not on file  Occupational History  . Not on file  Tobacco Use  . Smoking status: Never Smoker  . Smokeless tobacco: Never Used  Substance and Sexual Activity  . Alcohol use: Not on file  . Drug use: Not on file  . Sexual activity: Not on file  Other Topics Concern  . Not on file  Social History Narrative  . Not on file   Social Determinants of Health   Financial Resource Strain: Not on file  Food Insecurity: Not on file  Transportation Needs: Not on file  Physical Activity: Not on file  Stress: Not on file  Social Connections: Not on file  Intimate Partner Violence: Not on file    Past  Medical History, Surgical history, Social history, and Family history were reviewed and updated as appropriate.   Please see review of systems for further details on the patient's review from today.   Objective:   Physical Exam:  There were no vitals taken for this visit.  Physical Exam Neurological:     Mental Status: She is alert and oriented to person, place, and time.     Cranial Nerves: No dysarthria.  Psychiatric:        Attention and Perception: Attention and perception normal.        Mood and Affect: Mood is anxious. Mood is not depressed.        Speech: Speech normal.        Behavior: Behavior is cooperative.        Thought Content: Thought content normal. Thought content is not paranoid or delusional. Thought content does not include homicidal or suicidal ideation. Thought content does not include homicidal or suicidal plan.        Cognition and Memory: Cognition and memory normal.        Judgment: Judgment normal.     Comments: Insight intact     Lab Review:  No results found for: NA, K, CL, CO2, GLUCOSE, BUN, CREATININE, CALCIUM, PROT, ALBUMIN, AST, ALT, ALKPHOS, BILITOT, GFRNONAA, GFRAA  No results found for: WBC, RBC, HGB, HCT, PLT, MCV, MCH, MCHC, RDW, LYMPHSABS,  MONOABS, EOSABS, BASOSABS  No results found for: POCLITH, LITHIUM   No results found for: PHENYTOIN, PHENOBARB, VALPROATE, CBMZ   .res Assessment: Plan:    Patient seen for 30 minutes and time spent counseling patient regarding long-term treatment plan and her questions about continuing current medications or attempting dose reduction.  Reviewed treatment guidelines and discussed that studies indicate continuing medication for 6 months to 1 year after depressive symptoms have been in remission are associated with less risk of relapse, and if relapse continues, symptoms typically are less severe.  Discussed that patient is continuing to report significant anxiety signs and symptoms and is also continuing to grieving process several losses and deaths.  Recommend continuing medications for at least another 6 months and continuing therapy.  Patient is in agreement with this plan.  Discussed that she can try changing administration of Cymbalta to 80 mg QHS to potentially minimize fatigue during the day. Continue BuSpar 30 mg twice daily for anxiety. Discussed that she could resume hydroxyzine as needed since this has been helpful for her GI signs and symptoms in the past.  Also encouraged patient to talk with her therapist about GI signs and symptoms since she reports that the symptoms typically occur when her anxiety is increased. Recommend continuing psychotherapy with Bambi Cottle, LCSW. Patient to follow-up with this provider in 6 months or sooner if clinically indicated. Patient advised to contact office with any questions, adverse effects, or acute worsening in signs and symptoms.     Kristin Sparks was seen today for anxiety and depression.  Diagnoses and all orders for this visit:  Generalized anxiety disorder -     busPIRone (BUSPAR) 30 MG tablet; Take 1 tablet (30 mg total) by mouth 2 (two) times daily. -     DULoxetine (CYMBALTA) 20 MG capsule; TAKE 1 CAPSULE BY MOUTH  DAILY , WITH A 60 MG   CAPSULE TO EQUAL 80 MG  TOTAL DOSE -     DULoxetine (CYMBALTA) 60 MG capsule; TAKE 1 CAPSULE BY MOUTH  DAILY , WITH A 20 MG  CAPSULE TO EQUAL 80 MG  DAILY     Please see After Visit Summary for patient specific instructions.  Future Appointments  Date Time Provider [place]  11/11/2020  9:00 AM Cottle, Lucious Groves, LCSW [facility] None  11/25/2020  9:00 AM Cottle, Bambi G, LCSW [facility] None  12/09/2020  9:00 AM Cottle, Bambi G, LCSW [facility] None  12/23/2020  9:00 AM Cottle, Bambi G, LCSW [facility] None  01/06/2021  9:00 AM Cottle, Bambi G, LCSW [facility] None  01/20/2021  9:00 AM Cottle, Bambi G, LCSW [facility] None  02/03/2021  9:00 AM Cottle, Bambi G, LCSW [facility] None  02/17/2021  9:00 AM Cottle, Bambi G, LCSW [facility] None  03/03/2021  9:00 AM Cottle, Bambi G, LCSW [facility] None    No orders of the defined types were placed in this encounter.     -------------------------------

## 2020-10-28 ENCOUNTER — Ambulatory Visit: Payer: Medicare Other | Admitting: Psychology

## 2020-11-11 ENCOUNTER — Ambulatory Visit (INDEPENDENT_AMBULATORY_CARE_PROVIDER_SITE_OTHER): Payer: Medicare Other | Admitting: Psychology

## 2020-11-11 DIAGNOSIS — F411 Generalized anxiety disorder: Secondary | ICD-10-CM | POA: Diagnosis not present

## 2020-11-11 DIAGNOSIS — F321 Major depressive disorder, single episode, moderate: Secondary | ICD-10-CM | POA: Diagnosis not present

## 2020-11-11 DIAGNOSIS — F4323 Adjustment disorder with mixed anxiety and depressed mood: Secondary | ICD-10-CM | POA: Diagnosis not present

## 2020-11-25 ENCOUNTER — Ambulatory Visit (INDEPENDENT_AMBULATORY_CARE_PROVIDER_SITE_OTHER): Payer: Medicare Other | Admitting: Psychology

## 2020-11-25 DIAGNOSIS — F321 Major depressive disorder, single episode, moderate: Secondary | ICD-10-CM

## 2020-11-25 DIAGNOSIS — F4323 Adjustment disorder with mixed anxiety and depressed mood: Secondary | ICD-10-CM | POA: Diagnosis not present

## 2020-11-25 DIAGNOSIS — F411 Generalized anxiety disorder: Secondary | ICD-10-CM

## 2020-12-09 ENCOUNTER — Ambulatory Visit (INDEPENDENT_AMBULATORY_CARE_PROVIDER_SITE_OTHER): Payer: Medicare Other | Admitting: Psychology

## 2020-12-09 DIAGNOSIS — F321 Major depressive disorder, single episode, moderate: Secondary | ICD-10-CM

## 2020-12-09 DIAGNOSIS — F4323 Adjustment disorder with mixed anxiety and depressed mood: Secondary | ICD-10-CM | POA: Diagnosis not present

## 2020-12-09 DIAGNOSIS — F411 Generalized anxiety disorder: Secondary | ICD-10-CM | POA: Diagnosis not present

## 2020-12-23 ENCOUNTER — Ambulatory Visit: Payer: Medicare Other | Admitting: Psychology

## 2021-01-06 ENCOUNTER — Ambulatory Visit (INDEPENDENT_AMBULATORY_CARE_PROVIDER_SITE_OTHER): Payer: Medicare Other | Admitting: Psychology

## 2021-01-06 DIAGNOSIS — F4323 Adjustment disorder with mixed anxiety and depressed mood: Secondary | ICD-10-CM | POA: Diagnosis not present

## 2021-01-06 DIAGNOSIS — F411 Generalized anxiety disorder: Secondary | ICD-10-CM | POA: Diagnosis not present

## 2021-01-06 DIAGNOSIS — F321 Major depressive disorder, single episode, moderate: Secondary | ICD-10-CM | POA: Diagnosis not present

## 2021-01-20 ENCOUNTER — Ambulatory Visit (INDEPENDENT_AMBULATORY_CARE_PROVIDER_SITE_OTHER): Payer: Medicare Other | Admitting: Psychology

## 2021-01-20 DIAGNOSIS — F321 Major depressive disorder, single episode, moderate: Secondary | ICD-10-CM

## 2021-01-20 DIAGNOSIS — F4323 Adjustment disorder with mixed anxiety and depressed mood: Secondary | ICD-10-CM

## 2021-01-20 DIAGNOSIS — F411 Generalized anxiety disorder: Secondary | ICD-10-CM | POA: Diagnosis not present

## 2021-02-03 ENCOUNTER — Ambulatory Visit (INDEPENDENT_AMBULATORY_CARE_PROVIDER_SITE_OTHER): Payer: Medicare Other | Admitting: Psychology

## 2021-02-03 DIAGNOSIS — F411 Generalized anxiety disorder: Secondary | ICD-10-CM | POA: Diagnosis not present

## 2021-02-03 DIAGNOSIS — F4323 Adjustment disorder with mixed anxiety and depressed mood: Secondary | ICD-10-CM

## 2021-02-03 DIAGNOSIS — F321 Major depressive disorder, single episode, moderate: Secondary | ICD-10-CM | POA: Diagnosis not present

## 2021-02-17 ENCOUNTER — Ambulatory Visit (INDEPENDENT_AMBULATORY_CARE_PROVIDER_SITE_OTHER): Payer: Medicare Other | Admitting: Psychology

## 2021-02-17 DIAGNOSIS — F411 Generalized anxiety disorder: Secondary | ICD-10-CM

## 2021-02-17 DIAGNOSIS — F321 Major depressive disorder, single episode, moderate: Secondary | ICD-10-CM

## 2021-02-17 DIAGNOSIS — F4323 Adjustment disorder with mixed anxiety and depressed mood: Secondary | ICD-10-CM

## 2021-03-03 ENCOUNTER — Ambulatory Visit (INDEPENDENT_AMBULATORY_CARE_PROVIDER_SITE_OTHER): Payer: Medicare Other | Admitting: Psychology

## 2021-03-03 DIAGNOSIS — F321 Major depressive disorder, single episode, moderate: Secondary | ICD-10-CM | POA: Diagnosis not present

## 2021-03-03 DIAGNOSIS — F411 Generalized anxiety disorder: Secondary | ICD-10-CM

## 2021-03-03 DIAGNOSIS — F4323 Adjustment disorder with mixed anxiety and depressed mood: Secondary | ICD-10-CM | POA: Diagnosis not present

## 2021-03-17 ENCOUNTER — Ambulatory Visit (INDEPENDENT_AMBULATORY_CARE_PROVIDER_SITE_OTHER): Payer: Medicare Other | Admitting: Psychology

## 2021-03-17 DIAGNOSIS — F4323 Adjustment disorder with mixed anxiety and depressed mood: Secondary | ICD-10-CM | POA: Diagnosis not present

## 2021-03-17 DIAGNOSIS — F411 Generalized anxiety disorder: Secondary | ICD-10-CM

## 2021-03-17 DIAGNOSIS — F321 Major depressive disorder, single episode, moderate: Secondary | ICD-10-CM

## 2021-03-31 ENCOUNTER — Ambulatory Visit (INDEPENDENT_AMBULATORY_CARE_PROVIDER_SITE_OTHER): Payer: Medicare Other | Admitting: Psychology

## 2021-03-31 DIAGNOSIS — F4323 Adjustment disorder with mixed anxiety and depressed mood: Secondary | ICD-10-CM | POA: Diagnosis not present

## 2021-03-31 DIAGNOSIS — F411 Generalized anxiety disorder: Secondary | ICD-10-CM | POA: Diagnosis not present

## 2021-03-31 DIAGNOSIS — F321 Major depressive disorder, single episode, moderate: Secondary | ICD-10-CM | POA: Diagnosis not present

## 2021-04-09 ENCOUNTER — Telehealth (INDEPENDENT_AMBULATORY_CARE_PROVIDER_SITE_OTHER): Payer: Medicare Other | Admitting: Psychiatry

## 2021-04-09 ENCOUNTER — Encounter: Payer: Self-pay | Admitting: Psychiatry

## 2021-04-09 DIAGNOSIS — F3341 Major depressive disorder, recurrent, in partial remission: Secondary | ICD-10-CM

## 2021-04-09 DIAGNOSIS — F411 Generalized anxiety disorder: Secondary | ICD-10-CM | POA: Diagnosis not present

## 2021-04-09 DIAGNOSIS — G4700 Insomnia, unspecified: Secondary | ICD-10-CM | POA: Diagnosis not present

## 2021-04-09 MED ORDER — BUSPIRONE HCL 30 MG PO TABS: ORAL_TABLET | ORAL | 1 refills | Status: DC

## 2021-04-09 MED ORDER — DULOXETINE HCL 60 MG PO CPEP: 60.0000 mg | ORAL_CAPSULE | Freq: Every day | ORAL | 3 refills | Status: DC

## 2021-04-09 NOTE — Progress Notes (Signed)
Kristin Sparks [id number] 16-Jun-1956 65 y.o.  Virtual Visit via Telephone Note  I connected with pt on 04/09/21 at 10:00 AM EDT by telephone and verified that I am speaking with the correct person using two identifiers.   I discussed the limitations, risks, security and privacy concerns of performing an evaluation and management service by telephone and the availability of in person appointments. I also discussed with the patient that there may be a patient responsible charge related to this service. The patient expressed understanding and agreed to proceed.   I discussed the assessment and treatment plan with the patient. The patient was provided an opportunity to ask questions and all were answered. The patient agreed with the plan and demonstrated an understanding of the instructions.   The patient was advised to call back or seek an in-person evaluation if the symptoms worsen or if the condition fails to improve as anticipated.  I provided 30 minutes of non-face-to-face time during this encounter.  The patient was located at home.  The provider was located at [place].   Thayer Headings, PMHNP   Subjective:   Patient ID:  Kristin Sparks is a 65 y.o. (DOB Oct 17, 1955) female.  Chief Complaint:  Chief Complaint  Patient presents with   Follow-up    Depression, anxiety    HPI Kristin Sparks presents for follow-up of depression and anxiety. She reports that her mood is typically better in the summer months. She reports that she rarely has depression and when it occurs it is for a brief period. She reports that around mid-May she reduced Cymbalta from 80 mg to 60 mg "and I did ok." She reports that a few weeks ago she reduced Cymbalta to 40 mg for a week and experienced increased anxiety, insomnia, "doom and gloom." She resumed 60 mg dose a month ago and has been feeling better. She reports that over the winter she was wanting to sleep excessively. She reports that she is  feeling less tired and not having to sleep during the day. She reports that she also decreased Buspar to 15 mg po BID about 2 months ago. She reports, "I still have anxiety." She reports difficulty with concentration. She reports occasional difficulty falling asleep. She reports that she has had to take hydroxyzine prn on a few occasions. Energy and motivation have improved. She reports that her appetite has been good. She reports that concentration is ok. Denies SI.  Continues to run regularly.   Past medication trials: Sertraline-diarrhea.  Felt calmer. Lexapro-prescribed for hot flashes.  Had increased anxiety while taking. Effexor XR-some benefit and then was less effective.  Signs and symptoms inadequately controlled at 75 mg and unable to tolerate higher doses. Cymbalta Buspar Propanolol Ativan Hydroxyzine Lyrica Gabapentin-did not help with neuropathy  Review of Systems:  Review of Systems  Musculoskeletal:  Negative for gait problem.  Neurological:  Negative for tremors.  Psychiatric/Behavioral:         Please refer to HPI   Medications: I have reviewed the patient's current medications.  Current Outpatient Medications  Medication Sig Dispense Refill   levocetirizine (XYZAL) 5 MG tablet Take 5 mg by mouth daily as needed.      lovastatin (MEVACOR) 20 MG tablet Take 20 mg by mouth once a week.      meloxicam (MOBIC) 7.5 MG tablet Take 7.5 mg by mouth daily.     Multiple Vitamin (MULTIVITAMIN) tablet Take 1 tablet by mouth daily.     Omega-3 1000  MG CAPS Take by mouth.     pregabalin (LYRICA) 75 MG capsule Take 1 capsule (75 mg total) by mouth every evening. (Patient taking differently: Take 75 mg by mouth as needed.) 90 capsule 1   acetaminophen (TYLENOL) 325 MG tablet Take 650 mg by mouth every 6 (six) hours as needed.     albuterol (PROVENTIL HFA;VENTOLIN HFA) 108 (90 Base) MCG/ACT inhaler Inhale into the lungs.     busPIRone (BUSPAR) 30 MG tablet Take 0.5 tablets (15 mg  total) by mouth every morning AND 1 tablet (30 mg total) at bedtime. 135 tablet 1   calcium-vitamin D (SM CALCIUM 500/VITAMIN D3) 500-400 MG-UNIT tablet Take by mouth.     DULoxetine (CYMBALTA) 60 MG capsule Take 1 capsule (60 mg total) by mouth daily. 90 capsule 3   Fluticasone-Salmeterol (ADVAIR DISKUS) 250-50 MCG/DOSE AEPB Inhale into the lungs.     hydrOXYzine (ATARAX/VISTARIL) 10 MG tablet Take 1 tablet (10 mg total) by mouth at bedtime as needed for up to 30 days. 30 tablet 2   propranolol (INDERAL) 10 MG tablet Take 1/2-1 tab po BID prn anxiety (Do not use if pulse is <70) 60 tablet 1   No current facility-administered medications for this visit.    Medication Side Effects: None  Allergies:  Allergies  Allergen Reactions   Latex    Sulfa Antibiotics Rash    Past Medical History:  Diagnosis Date   Allergic rhinitis    Asthma    Elevated cholesterol    Neuropathy     Family History  Problem Relation Age of Onset   Alcohol abuse Father    Depression Sister     Social History   Socioeconomic History   Marital status: Widowed    Spouse name: Not on file   Number of children: Not on file   Years of education: Not on file   Highest education level: Not on file  Occupational History   Not on file  Tobacco Use   Smoking status: Never   Smokeless tobacco: Never  Substance and Sexual Activity   Alcohol use: Not on file   Drug use: Not on file   Sexual activity: Not on file  Other Topics Concern   Not on file  Social History Narrative   Not on file   Social Determinants of Health   Financial Resource Strain: Not on file  Food Insecurity: Not on file  Transportation Needs: Not on file  Physical Activity: Not on file  Stress: Not on file  Social Connections: Not on file  Intimate Partner Violence: Not on file    Past Medical History, Surgical history, Social history, and Family history were reviewed and updated as appropriate.   Please see review of  systems for further details on the patient's review from today.   Objective:   Physical Exam:  There were no vitals taken for this visit.  Physical Exam Constitutional:      General: She is not in acute distress. Musculoskeletal:        General: No deformity.  Neurological:     Mental Status: She is alert and oriented to person, place, and time.     Coordination: Coordination normal.  Psychiatric:        Attention and Perception: Attention and perception normal. She does not perceive auditory or visual hallucinations.        Mood and Affect: Mood is anxious. Mood is not depressed. Affect is not labile, blunt, angry or inappropriate.  Speech: Speech normal.        Behavior: Behavior normal.        Thought Content: Thought content normal. Thought content is not paranoid or delusional. Thought content does not include homicidal or suicidal ideation. Thought content does not include homicidal or suicidal plan.        Cognition and Memory: Cognition and memory normal.        Judgment: Judgment normal.     Comments: Insight intact    Lab Review:  No results found for: NA, K, CL, CO2, GLUCOSE, BUN, CREATININE, CALCIUM, PROT, ALBUMIN, AST, ALT, ALKPHOS, BILITOT, GFRNONAA, GFRAA  No results found for: WBC, RBC, HGB, HCT, PLT, MCV, MCH, MCHC, RDW, LYMPHSABS, MONOABS, EOSABS, BASOSABS  No results found for: POCLITH, LITHIUM   No results found for: PHENYTOIN, PHENOBARB, VALPROATE, CBMZ   .res Assessment: Plan:   Pt seen for 30 minutes and time spent reviewing medications and doses. Discussed that Buspar can be adjusted based to minimize side effects since she reports that Buspar causes sleepiness. Discussed dosing Buspar to 15 mg po q am and 30 mg po QHS for anxiety.  Continue Cymbalta 60 mg po qd for anxiety and depression.  Continue hydroxyzine 10 mg po QHS prn insomnia.  Recommend continuing therapy with Bambi Cottle, LCSW.  Pt to follow-up in 6 months or sooner if clinically  indicated.  Patient advised to contact office with any questions, adverse effects, or acute worsening in signs and symptoms.   Kristin Sparks was seen today for follow-up.  Diagnoses and all orders for this visit:  Generalized anxiety disorder -     busPIRone (BUSPAR) 30 MG tablet; Take 0.5 tablets (15 mg total) by mouth every morning AND 1 tablet (30 mg total) at bedtime. -     DULoxetine (CYMBALTA) 60 MG capsule; Take 1 capsule (60 mg total) by mouth daily.  Depression, major, recurrent, in partial remission ([place])  Insomnia, unspecified type   Please see After Visit Summary for patient specific instructions.  Future Appointments  Date Time Provider [place]  04/14/2021  9:00 AM Cottle, Bambi G, LCSW [facility] None  04/28/2021  9:00 AM Cottle, Bambi G, LCSW [facility] None    No orders of the defined types were placed in this encounter.     -------------------------------

## 2021-04-14 ENCOUNTER — Ambulatory Visit (INDEPENDENT_AMBULATORY_CARE_PROVIDER_SITE_OTHER): Payer: Medicare Other | Admitting: Psychology

## 2021-04-14 DIAGNOSIS — F4323 Adjustment disorder with mixed anxiety and depressed mood: Secondary | ICD-10-CM | POA: Diagnosis not present

## 2021-04-14 DIAGNOSIS — F321 Major depressive disorder, single episode, moderate: Secondary | ICD-10-CM

## 2021-04-14 DIAGNOSIS — F411 Generalized anxiety disorder: Secondary | ICD-10-CM

## 2021-04-28 ENCOUNTER — Ambulatory Visit (INDEPENDENT_AMBULATORY_CARE_PROVIDER_SITE_OTHER): Payer: Medicare Other | Admitting: Psychology

## 2021-04-28 DIAGNOSIS — F321 Major depressive disorder, single episode, moderate: Secondary | ICD-10-CM | POA: Diagnosis not present

## 2021-04-28 DIAGNOSIS — F411 Generalized anxiety disorder: Secondary | ICD-10-CM

## 2021-04-28 DIAGNOSIS — F4323 Adjustment disorder with mixed anxiety and depressed mood: Secondary | ICD-10-CM | POA: Diagnosis not present

## 2021-05-26 ENCOUNTER — Ambulatory Visit (INDEPENDENT_AMBULATORY_CARE_PROVIDER_SITE_OTHER): Payer: Medicare Other | Admitting: Psychology

## 2021-05-26 DIAGNOSIS — F321 Major depressive disorder, single episode, moderate: Secondary | ICD-10-CM | POA: Diagnosis not present

## 2021-05-26 DIAGNOSIS — F411 Generalized anxiety disorder: Secondary | ICD-10-CM | POA: Diagnosis not present

## 2021-05-26 DIAGNOSIS — F4323 Adjustment disorder with mixed anxiety and depressed mood: Secondary | ICD-10-CM

## 2021-06-09 ENCOUNTER — Ambulatory Visit (INDEPENDENT_AMBULATORY_CARE_PROVIDER_SITE_OTHER): Payer: Medicare Other | Admitting: Psychology

## 2021-06-09 DIAGNOSIS — F321 Major depressive disorder, single episode, moderate: Secondary | ICD-10-CM

## 2021-06-09 DIAGNOSIS — F4323 Adjustment disorder with mixed anxiety and depressed mood: Secondary | ICD-10-CM | POA: Diagnosis not present

## 2021-06-09 DIAGNOSIS — F411 Generalized anxiety disorder: Secondary | ICD-10-CM | POA: Diagnosis not present

## 2021-06-23 ENCOUNTER — Ambulatory Visit (INDEPENDENT_AMBULATORY_CARE_PROVIDER_SITE_OTHER): Payer: Medicare Other | Admitting: Psychology

## 2021-06-23 DIAGNOSIS — F4323 Adjustment disorder with mixed anxiety and depressed mood: Secondary | ICD-10-CM | POA: Diagnosis not present

## 2021-06-23 DIAGNOSIS — F411 Generalized anxiety disorder: Secondary | ICD-10-CM | POA: Diagnosis not present

## 2021-06-23 DIAGNOSIS — F321 Major depressive disorder, single episode, moderate: Secondary | ICD-10-CM | POA: Diagnosis not present

## 2021-07-07 ENCOUNTER — Ambulatory Visit: Payer: Medicare Other | Admitting: Psychology

## 2021-07-21 ENCOUNTER — Ambulatory Visit (INDEPENDENT_AMBULATORY_CARE_PROVIDER_SITE_OTHER): Payer: Medicare Other | Admitting: Psychology

## 2021-07-21 DIAGNOSIS — F411 Generalized anxiety disorder: Secondary | ICD-10-CM

## 2021-07-21 DIAGNOSIS — F4323 Adjustment disorder with mixed anxiety and depressed mood: Secondary | ICD-10-CM

## 2021-07-21 DIAGNOSIS — F321 Major depressive disorder, single episode, moderate: Secondary | ICD-10-CM

## 2021-08-04 ENCOUNTER — Ambulatory Visit (INDEPENDENT_AMBULATORY_CARE_PROVIDER_SITE_OTHER): Payer: Medicare Other | Admitting: Psychology

## 2021-08-04 DIAGNOSIS — F411 Generalized anxiety disorder: Secondary | ICD-10-CM | POA: Diagnosis not present

## 2021-08-04 DIAGNOSIS — F321 Major depressive disorder, single episode, moderate: Secondary | ICD-10-CM | POA: Diagnosis not present

## 2021-08-04 DIAGNOSIS — F4323 Adjustment disorder with mixed anxiety and depressed mood: Secondary | ICD-10-CM

## 2021-08-18 ENCOUNTER — Ambulatory Visit: Payer: Medicare Other | Admitting: Psychology

## 2021-09-01 ENCOUNTER — Ambulatory Visit (INDEPENDENT_AMBULATORY_CARE_PROVIDER_SITE_OTHER): Payer: Medicare Other | Admitting: Psychology

## 2021-09-01 DIAGNOSIS — F411 Generalized anxiety disorder: Secondary | ICD-10-CM | POA: Diagnosis not present

## 2021-09-01 DIAGNOSIS — F331 Major depressive disorder, recurrent, moderate: Secondary | ICD-10-CM | POA: Diagnosis not present

## 2021-09-01 DIAGNOSIS — F4323 Adjustment disorder with mixed anxiety and depressed mood: Secondary | ICD-10-CM | POA: Diagnosis not present

## 2021-09-15 ENCOUNTER — Ambulatory Visit (INDEPENDENT_AMBULATORY_CARE_PROVIDER_SITE_OTHER): Payer: Medicare Other | Admitting: Psychology

## 2021-09-15 DIAGNOSIS — F4323 Adjustment disorder with mixed anxiety and depressed mood: Secondary | ICD-10-CM

## 2021-09-15 DIAGNOSIS — F411 Generalized anxiety disorder: Secondary | ICD-10-CM

## 2021-09-15 DIAGNOSIS — F331 Major depressive disorder, recurrent, moderate: Secondary | ICD-10-CM

## 2021-09-15 NOTE — Progress Notes (Signed)
[place] Counselor/Therapist Progress Note  Patient ID: KAIRA STRINGFIELD, MRN: [id number],    Date: 09/15/2021  Time Spent: 60 minutes  Treatment Type: Individual Therapy  Reported Symptoms: anxiety and depression  Mental Status Exam: Appearance:  Casual     Behavior: Appropriate  Motor: Normal  Speech/Language:  Normal Rate  Affect: Blunt  Mood: anxious  Thought process: normal  Thought content:   WNL  Sensory/Perceptual disturbances:   WNL  Orientation: oriented to person, place, time/date, and situation  Attention: Good  Concentration: Good  Memory: WNL  Fund of knowledge:  Good  Insight:   Good  Judgment:  Good  Impulse Control: Good   Risk Assessment: Danger to Self:  No Self-injurious Behavior: No Danger to Others: No Duty to Warn:no Physical Aggression / Violence:No  Access to Firearms a concern: No  Gang Involvement:No   Subjective: The patient attended a face-to-face individual therapy session via video visit.  The patient gave verbal consent to have the session on video on WebEx.  The patient was in her home alone and the therapist was in the office.  The patient continues to talk about knowing that she needs to make some decisions about what she is going to do now that her husband is no longer with her.  She talked about having some depression and sadness during the holidays because of missing her husband.  We talked about the need for her to continue to explore options in terms of finding a place to live that she wants to be.  She knows that she does not want to continue to live in the [place] area.  The patient talked about going to her sister's for the holiday and she is going to talk with her sister about staying for about a month just to see what it is like to be down in that area.  I recommended that she may want to consider looking at [facility] as well.  Reviewed again with her that if she decided to move towards the because she  would probably have to find a place where she had a few more supports.  We discussed these resources.  The patient seems to be stuck a little and where she is at and potentially could become a caregiver for her sister a little bit more.  I cautioned her to try to find her own place to be where she finds some happiness so that she does not get caught up in caretaking again.  Interventions: Cognitive Behavioral Therapy and Assertiveness/Communication  Diagnosis:Major depressive disorder, recurrent episode, moderate (HCC)  Generalized anxiety disorder  Adjustment disorder with mixed anxiety and depressed mood  Plan: Please see Treatment plan in Therapy Charts with [place] date of 10/15/2021 for goals and progress.  Will continue to see patient biweekly for now.  Raysa Bosak G Jamera Vanloan, LCSW

## 2021-09-29 ENCOUNTER — Ambulatory Visit (INDEPENDENT_AMBULATORY_CARE_PROVIDER_SITE_OTHER): Payer: Medicare Other | Admitting: Psychology

## 2021-09-29 DIAGNOSIS — F4323 Adjustment disorder with mixed anxiety and depressed mood: Secondary | ICD-10-CM

## 2021-09-29 DIAGNOSIS — F411 Generalized anxiety disorder: Secondary | ICD-10-CM | POA: Diagnosis not present

## 2021-09-29 DIAGNOSIS — F331 Major depressive disorder, recurrent, moderate: Secondary | ICD-10-CM

## 2021-09-29 NOTE — Progress Notes (Signed)
[facility] Counselor/Therapist Progress Note  Patient ID: Kristin Sparks, MRN: [id number],    Date: 09/29/2021  Time Spent: 60 minutes  Treatment Type: Individual Therapy  Reported Symptoms: anxiety and depression  Mental Status Exam: Appearance:  Casual     Behavior: Appropriate  Motor: Normal  Speech/Language:  Normal Rate  Affect: Blunt  Mood: anxious  Thought process: normal  Thought content:   WNL  Sensory/Perceptual disturbances:   WNL  Orientation: oriented to person, place, time/date, and situation  Attention: Good  Concentration: Good  Memory: WNL  Fund of knowledge:  Good  Insight:   Good  Judgment:  Good  Impulse Control: Good   Risk Assessment: Danger to Self:  No Self-injurious Behavior: No Danger to Others: No Duty to Warn:no Physical Aggression / Violence:No  Access to Firearms a concern: No  Gang Involvement:No   Subjective: The patient attended a face-to-face individual therapy session via video visit.  The patient gave verbal consent to have the session on video on WebEx.  The patient was in her home alone and the therapist was in the office.  The patient states that she went to her sister's for Christmas and had a nice visit.  The patient still has not made a decision about where she would like to end up living.  The patient is still very concerned about her son and his wellbeing as he has been very stressed at his new job.  We discussed ways to respond to him and to help him make good decisions for himself as the patient struggles with how to manage her situation because he is having a difficult time.  The patient talked about having his dog most of the time.  She states that she would really rather not have the dog all the time because he feels like it is a big responsibility.  We talked about and problem solved how to handle the situation with her son and decided that it is probably best that she wait until he decides if he wants to take  this new job before she brings it up to him.  The patient continues to explore her options and she does know that she does not want to continue living where she is at.  We will continue to help her with cognitive behavioral health and problem solving.  Interventions: Cognitive Behavioral Therapy and Assertiveness/Communication  Diagnosis:Major depressive disorder, recurrent episode, moderate (HCC)  Generalized anxiety disorder  Adjustment disorder with mixed anxiety and depressed mood  Plan: Please see Treatment plan in Therapy Charts with Target date of 10/15/2021 for goals and progress.  Will continue to see patient biweekly for now.  Dishon Kehoe G Ijeoma Loor, LCSW

## 2021-10-06 ENCOUNTER — Encounter: Payer: Self-pay | Admitting: Psychiatry

## 2021-10-06 ENCOUNTER — Ambulatory Visit (INDEPENDENT_AMBULATORY_CARE_PROVIDER_SITE_OTHER): Payer: Medicare Other | Admitting: Psychiatry

## 2021-10-06 ENCOUNTER — Other Ambulatory Visit: Payer: Self-pay

## 2021-10-06 DIAGNOSIS — F331 Major depressive disorder, recurrent, moderate: Secondary | ICD-10-CM

## 2021-10-06 DIAGNOSIS — F411 Generalized anxiety disorder: Secondary | ICD-10-CM

## 2021-10-06 MED ORDER — DULOXETINE HCL 20 MG PO CPEP: ORAL_CAPSULE | ORAL | 0 refills | Status: DC

## 2021-10-06 MED ORDER — BUPROPION HCL ER (SR) 100 MG PO TB12: 100.0000 mg | ORAL_TABLET | Freq: Every morning | ORAL | 1 refills | Status: DC

## 2021-10-06 NOTE — Progress Notes (Signed)
EMERALD SHOR [id number] [birth year] 66 y.o.  Subjective:   Patient ID:  Kristin Sparks is a 66 y.o. (DOB 10/31/1955) female.  Chief Complaint:  Chief Complaint  Patient presents with   Follow-up    Anxiety and depression    HPI Kristin Sparks presents to the office today for follow-up of anxiety and depression. She reports, "I'm ok as long as I am around people." She lives alone. She has son's dog most of the time and reports that the dog is a source of company. Continues to walk and run with a group. "I'm still kind of lonely."   She reports that she was sleeping a lot in September and October and PCP and therapist attributed this to depression. She reports that she is continuing to nap in the afternoon- "probably just trying to sleep away the day." She is sleeping 9-10 hours a night. She tries to plan things and structure her day. Motivation has been low. "I have to force myself to go out and do things." She reports, "I'm just tired more." She reports feeling sad at times. She reports that she is "easily startled." She reports "I still worry about my son and my life... it's about the same." Denies any panic attacks. Reports that she has only used Propranolol prn 1-2 times. Takes hydroxyzine prn 1-2 times a month. She reports increased appetite. Has been craving carbs. No change in concentration. She reports diminished interest and enjoyment in things.   She went and visited relatives for the holidays and had visitors.    Son is getting married in May. Able to see him regularly.   She continues to see [name], LCSW for therapy.   She reports that she added Cymbalta 20 mg QHS to Cymbalta 60 mg in the morning. "I think I am ok with that."   Past medication trials: Sertraline-diarrhea.  Felt calmer. Lexapro-prescribed for hot flashes.  Had increased anxiety while taking. Effexor XR-some benefit and then was less effective.  Signs and symptoms inadequately controlled at 75 mg and  unable to tolerate higher doses. Cymbalta Buspar Propanolol Ativan Hydroxyzine Lyrica Gabapentin-did not help with neuropathy  Review of Systems:  Review of Systems  Gastrointestinal: Negative.   Endocrine:       She reports that labs showed pre-diabetes  Musculoskeletal:  Negative for gait problem.  Neurological:  Negative for tremors and headaches.  Psychiatric/Behavioral:         Please refer to HPI   Medications: I have reviewed the patient's current medications.  Current Outpatient Medications  Medication Sig Dispense Refill   albuterol (PROVENTIL HFA;VENTOLIN HFA) 108 (90 Base) MCG/ACT inhaler Inhale into the lungs.     buPROPion ER (WELLBUTRIN SR) 100 MG 12 hr tablet Take 1 tablet (100 mg total) by mouth every morning. 30 tablet 1   busPIRone (BUSPAR) 30 MG tablet Take 0.5 tablets (15 mg total) by mouth every morning AND 1 tablet (30 mg total) at bedtime. (Patient taking differently: 15 mg in the morning) 135 tablet 1   calcium-vitamin D (OSCAL-500) 500-400 MG-UNIT tablet Take by mouth.     DULoxetine (CYMBALTA) 60 MG capsule Take 1 capsule (60 mg total) by mouth daily. 90 capsule 3   levocetirizine (XYZAL) 5 MG tablet Take 5 mg by mouth daily as needed.      lovastatin (MEVACOR) 20 MG tablet Take 20 mg by mouth once a week.      Multiple Vitamin (MULTIVITAMIN) tablet Take 1 tablet by mouth  daily.     Omega-3 1000 MG CAPS Take by mouth.     propranolol (INDERAL) 10 MG tablet Take 1/2-1 tab po BID prn anxiety (Do not use if pulse is <70) 60 tablet 1   acetaminophen (TYLENOL) 325 MG tablet Take 650 mg by mouth every 6 (six) hours as needed.     DULoxetine (CYMBALTA) 20 MG capsule TAKE 1 CAPSULE BY MOUTH  DAILY , WITH A 60 MG  CAPSULE TO EQUAL 80 MG  TOTAL DOSE 90 capsule 0   Fluticasone-Salmeterol (ADVAIR DISKUS) 250-50 MCG/DOSE AEPB Inhale into the lungs.     hydrOXYzine (ATARAX/VISTARIL) 10 MG tablet Take 1 tablet (10 mg total) by mouth at bedtime as needed for up to 30  days. 30 tablet 2   meloxicam (MOBIC) 7.5 MG tablet Take 7.5 mg by mouth daily. (Patient not taking: Reported on 10/06/2021)     pregabalin (LYRICA) 75 MG capsule Take 1 capsule (75 mg total) by mouth every evening. (Patient taking differently: Take 75 mg by mouth as needed.) 90 capsule 1   No current facility-administered medications for this visit.    Medication Side Effects: None  Allergies:  Allergies  Allergen Reactions   Latex    Sulfa Antibiotics Rash    Past Medical History:  Diagnosis Date   Allergic rhinitis    Asthma    Elevated cholesterol    Neuropathy     Past Medical History, Surgical history, Social history, and Family history were reviewed and updated as appropriate.   Please see review of systems for further details on the patient's review from today.   Objective:   Physical Exam:  There were no vitals taken for this visit.  Physical Exam Constitutional:      General: She is not in acute distress. Musculoskeletal:        General: No deformity.  Neurological:     Mental Status: She is alert and oriented to person, place, and time.     Coordination: Coordination normal.  Psychiatric:        Attention and Perception: Attention and perception normal. She does not perceive auditory or visual hallucinations.        Mood and Affect: Mood is depressed. Affect is not labile, blunt, angry or inappropriate.        Speech: Speech normal.        Behavior: Behavior normal.        Thought Content: Thought content normal. Thought content is not paranoid or delusional. Thought content does not include homicidal or suicidal ideation. Thought content does not include homicidal or suicidal plan.        Cognition and Memory: Cognition and memory normal.        Judgment: Judgment normal.     Comments: Insight intact Mood is mildly anxious    Lab Review:  No results found for: NA, K, CL, CO2, GLUCOSE, BUN, CREATININE, CALCIUM, PROT, ALBUMIN, AST, ALT, ALKPHOS, BILITOT,  GFRNONAA, GFRAA  No results found for: WBC, RBC, HGB, HCT, PLT, MCV, MCH, MCHC, RDW, LYMPHSABS, MONOABS, EOSABS, BASOSABS  No results found for: POCLITH, LITHIUM   No results found for: PHENYTOIN, PHENOBARB, VALPROATE, CBMZ   .res Assessment: Plan:    Pt seen for 30 minutes and time spent discussing treatment options for low mood, energy, and motivation. Discussed potential benefits, risks, and side effects of Wellbutrin. Pt agrees to trial of Wellbutrin. Discussed starting with SR formulation since she has had insomnia after initiation of other psychotropic meds and  SR would be less likely to interfere with sleep due to shorter duration. Discussed that Wellbutrin could be increased and/or changed to XL if needed based on response.  Will continue Duloxetine 80 mg po qd for depression and anxiety.  Continue Buspar 15 mg in the morning for anxiety.  Recommend continuing therapy with Bambi Cottle, LCSW.  Pt to follow-up in 4 weeks or sooner if clinically indicated. Patient advised to contact office with any questions, adverse effects, or acute worsening in signs and symptoms.   Kristin Sparks was seen today for follow-up.  Diagnoses and all orders for this visit:  Major depressive disorder, recurrent episode, moderate (HCC) -     buPROPion ER (WELLBUTRIN SR) 100 MG 12 hr tablet; Take 1 tablet (100 mg total) by mouth every morning.  Generalized anxiety disorder -     DULoxetine (CYMBALTA) 20 MG capsule; TAKE 1 CAPSULE BY MOUTH  DAILY , WITH A 60 MG  CAPSULE TO EQUAL 80 MG  TOTAL DOSE     Please see After Visit Summary for patient specific instructions.  Future Appointments  Date Time Provider [place]  10/13/2021  9:00 AM Cottle, Lucious Groves, LCSW [facility] None  10/27/2021  9:00 AM Cottle, Bambi G, LCSW [facility] None  11/11/2021  1:15 PM Thayer Headings, PMHNP [facility] None    No orders of the defined types were placed in this encounter.   -------------------------------

## 2021-10-13 ENCOUNTER — Ambulatory Visit (INDEPENDENT_AMBULATORY_CARE_PROVIDER_SITE_OTHER): Payer: Medicare Other | Admitting: Psychology

## 2021-10-13 DIAGNOSIS — F411 Generalized anxiety disorder: Secondary | ICD-10-CM

## 2021-10-13 DIAGNOSIS — F4323 Adjustment disorder with mixed anxiety and depressed mood: Secondary | ICD-10-CM | POA: Diagnosis not present

## 2021-10-13 DIAGNOSIS — F331 Major depressive disorder, recurrent, moderate: Secondary | ICD-10-CM

## 2021-10-13 NOTE — Progress Notes (Signed)
[facility] Counselor/Therapist Progress Note  Patient ID: Kristin Sparks, MRN: [id number],    Date: 10/13/2021  Time Spent: 60 minutes  Treatment Type: Individual Therapy  Reported Symptoms: anxiety and depression  Mental Status Exam: Appearance:  Casual     Behavior: Appropriate  Motor: Normal  Speech/Language:  Normal Rate  Affect: Blunt  Mood: anxious  Thought process: normal  Thought content:   WNL  Sensory/Perceptual disturbances:   WNL  Orientation: oriented to person, place, time/date, and situation  Attention: Good  Concentration: Good  Memory: WNL  Fund of knowledge:  Good  Insight:   Good  Judgment:  Good  Impulse Control: Good   Risk Assessment: Danger to Self:  No Self-injurious Behavior: No Danger to Others: No Duty to Warn:no Physical Aggression / Violence:No  Access to Firearms a concern: No  Gang Involvement:No   Subjective: The patient attended a face-to-face individual therapy session via video visit.  The patient gave verbal consent to have the session on video on WebEx.  The patient was in her home alone and the therapist was in the office.  The patient presents with a blunted affect and mood is pleasant but anxious.  The patient reports that she spent some time with her sister at her house this past week and that went well.  The patient is still struggling with what to do with her life and where she should move.  The patient is very well aware that she probably needs to spend some time and [place] and also in [place] to determine whether she wants to live there or not.  The patient talked again about moving there to spend time with her sister or her son.  I explained that she probably needs to consider moving somewhere not because of her sister or her son but because that is where she wants to live and make new friends.  I encouraged her to try to step out of her comfort zone and scheduled herself to do something with someone else.   The patient is limiting her self because of her indecisiveness.  I encouraged her to think of something that she might want to do and she says that she would like to go cross country skiing in [place].  I encouraged her to explore that as an option.  Interventions: Cognitive Behavioral Therapy and Assertiveness/Communication  Diagnosis:Generalized anxiety disorder  Major depressive disorder, recurrent episode, moderate (HCC)  Adjustment disorder with mixed anxiety and depressed mood  Plan:  Client Abilities/Strengths  Intelligent, caring, motivated  Client Treatment Preferences  Outpatient individual therapy  Client Statement of Needs  " I need some help to deal with my situation", "I 'm not sure if I'm depressed or what?"  Treatment Level  Outpatient Individual therapy  Symptoms  Frustration and anxiety related to providing oversight and caretaking to an aging, ailing, and dependent  parent.: No Description Entered (Status: improved). Lack of energy.: No Description Entered (Status:  improved). Motor tension (e.g., restlessness, tiredness, shakiness, muscle tension).: No Description  Entered (Status: improved). Poor concentration and indecisiveness.: No Description Entered (Status:  improved). Social withdrawal.: No Description Entered (Status: improved).  Problems Addressed  Unipolar Depression, Phase Of Life Problems, Unipolar Depression, Anxiety, Unipolar Depression,  Phase Of Life Problems, Anxiety  Goals 1. Appropriately grieve the loss in order to normalize mood and to return  to previously adaptive level of functioning. 2. Balance life activities between consideration of others and development of own interests. Objective Apply problem-solving skills  to current circumstances Target Date: 2022-10-15 Frequency: Biweekly Progress: 90 Modality: individual Related Interventions 1. Use modeling and role-playing with the client to apply the problem-solving approach to  his/her  current circumstances (or assign "Applying Problem-Solving to Interpersonal Conflict" from  the Adult Psychotherapy Homework Planner by [facility]); encourage implementation of action  plan, reinforcing success and redirecting for failure. Objective Implement new activities that increase a sense of satisfaction. Target Date: 2022-10-15 Frequency: Biweekly Progress: 90 Modality: individual Related Interventions 1. Develop a plan with the client to include activities that will increase his/her satisfaction, fulfill  his/her values, and improve the quality of his/her life. 3. Develop healthy interpersonal relationships that lead to the alleviation  and help prevent the relapse of depression. Objective Learn and implement problem-solving and decision-making skills. Target Date: 2022-10-15 Frequency: Biweekly Progress: 90 Modality: individual Related Interventions 1. Encourage in the client the development of a positive problem orientation in which problems  and solving them are viewed as a natural part of life and not something to be feared, despaired,  or avoided. 2. Conduct Problem-Solving Therapy  Implement new activities that increase a sense of satisfaction. Target Date: 2022-10-15 Frequency: Biweekly Progress: 90 Modality: individual Related Interventions 1. Develop a plan with the client to include activities that will increase his/her satisfaction, fulfill  his/her values, and improve the quality of his/her life. 3. Develop healthy interpersonal relationships that lead to the alleviation  and help prevent the relapse of depression. Objective Learn and implement problem-solving and decision-making skills. Target Date: 2022-10-15 Frequency: Biweekly Progress: 90 Modality: individual Related Interventions 1. Encourage in the client the development of a positive problem orientation in which problems  and solving them are viewed as a natural part of life and not something to  be feared, despaired,  or avoided.  Objective Identify and replace thoughts and beliefs that support depression. Target Date: 2022-10-15 Frequency: Biweekly Progress: 90 Modality: individual Related Interventions 1. Explore and restructure underlying assumptions and beliefs reflected in biased self-talk that  may put the client at risk for relapse or recurrence. 2. Facilitate and reinforce the client's shift from biased depressive self-talk and beliefs to realitybased cognitive messages that enhance self-confidence and increase adaptive actions (see  "Positive Self-Talk" in the Adult Psychotherapy Homework Planner by Bryn Gulling). 4. Enhance ability to effectively cope with the full variety of life's worries  and anxieties. Objective Identify, challenge, and replace biased, fearful self-talk with positive, realistic, and empowering selftalk. Target Date: 2022-10-15 Frequency: Biweekly Progress: 80 Modality: individual Related Interventions 1. Explore the client's schema and self-talk that mediate his/her fear response; assist him/her in  challenging the biases; replace the distorted messages with reality-based alternatives and  positive, realistic self-talk that will increase his/her self-confidence in coping with irrational  fears (see Cognitive Therapy of Anxiety Disorders by Alison Stalling). Objective Learn and implement problem-solving strategies for realistically addressing worries. Target Date: 2021-10-15 Frequency: Biweekly Progress: 60 Modality: individual Therapy 5. Recognize, accept, and cope with feelings of depression. 6. Resolve conflicted feelings and adapt to the new life circumstances. 7. Stabilize anxiety level while increasing ability to function on a daily  The patient is making progress and has approved this plan of treatment.  Asal Teas G Kaylen Motl, LCSW                  Corazon Nickolas G Belvin Gauss, LCSW

## 2021-10-20 ENCOUNTER — Other Ambulatory Visit: Payer: Self-pay | Admitting: Physician Assistant

## 2021-10-20 DIAGNOSIS — N644 Mastodynia: Secondary | ICD-10-CM

## 2021-10-26 ENCOUNTER — Other Ambulatory Visit: Payer: Self-pay | Admitting: Psychiatry

## 2021-10-26 DIAGNOSIS — F331 Major depressive disorder, recurrent, moderate: Secondary | ICD-10-CM

## 2021-10-27 ENCOUNTER — Ambulatory Visit (INDEPENDENT_AMBULATORY_CARE_PROVIDER_SITE_OTHER): Payer: Medicare Other | Admitting: Psychology

## 2021-10-27 ENCOUNTER — Other Ambulatory Visit: Payer: Self-pay | Admitting: Physician Assistant

## 2021-10-27 DIAGNOSIS — N644 Mastodynia: Secondary | ICD-10-CM

## 2021-10-27 DIAGNOSIS — F4323 Adjustment disorder with mixed anxiety and depressed mood: Secondary | ICD-10-CM | POA: Diagnosis not present

## 2021-10-27 DIAGNOSIS — F411 Generalized anxiety disorder: Secondary | ICD-10-CM

## 2021-10-27 DIAGNOSIS — F331 Major depressive disorder, recurrent, moderate: Secondary | ICD-10-CM

## 2021-10-27 NOTE — Progress Notes (Signed)
[facility] Counselor/Therapist Progress Note  Patient ID: Kristin Sparks, MRN: [id number],    Date: 10/27/2021  Time Spent: 60 minutes  Treatment Type: Individual Therapy  Reported Symptoms: anxiety and depression  Mental Status Exam: Appearance:  Casual     Behavior: Appropriate  Motor: Normal  Speech/Language:  Normal Rate  Affect: Blunt  Mood: anxious  Thought process: normal  Thought content:   WNL  Sensory/Perceptual disturbances:   WNL  Orientation: oriented to person, place, time/date, and situation  Attention: Good  Concentration: Good  Memory: WNL  Fund of knowledge:  Good  Insight:   Good  Judgment:  Good  Impulse Control: Good   Risk Assessment: Danger to Self:  No Self-injurious Behavior: No Danger to Others: No Duty to Warn:no Physical Aggression / Violence:No  Access to Firearms a concern: No  Gang Involvement:No   Subjective: The patient attended a face-to-face individual therapy session via video visit.  The patient gave verbal consent to have the session on video on WebEx.  The patient was in her home alone and the therapist was in the office.  The patient presents with a blunted affect and mood is pleasant.  The patient reports that she has thought about what she wants to do and feels like she is likely ready to move pretty soon.  She realizes that she does not have a lot of support in [place] and she needs to find some other place to be.  We talked about how to begin looking for another place in another area.  I recommended that she look on the Internet and see if she can find different communities that she might want to go look at.  The patient seems to be interested in moving to the [place] area or to [place] where she has some support.  We talked again about the importance of having someone to call if she needed them.  The patient still struggles with making decisions and looking at all options.  This is a good  thing and in some cases but for her it seems to be slowing her down.   Interventions: Cognitive Behavioral Therapy and Assertiveness/Communication  Diagnosis:Generalized anxiety disorder  Major depressive disorder, recurrent episode, moderate (HCC)  Adjustment disorder with mixed anxiety and depressed mood  Plan:  Client Abilities/Strengths  Intelligent, caring, motivated  Client Treatment Preferences  Outpatient individual therapy  Client Statement of Needs  " I need some help to deal with my situation", "I 'm not sure if I'm depressed or what?"  Treatment Level  Outpatient Individual therapy  Symptoms  Frustration and anxiety related to providing oversight and caretaking to an aging, ailing, and dependent  parent.: No Description Entered (Status: improved). Lack of energy.: No Description Entered (Status:  improved). Motor tension (e.g., restlessness, tiredness, shakiness, muscle tension).: No Description  Entered (Status: improved). Poor concentration and indecisiveness.: No Description Entered (Status:  improved). Social withdrawal.: No Description Entered (Status: improved).  Problems Addressed  Unipolar Depression, Phase Of Life Problems, Unipolar Depression, Anxiety, Unipolar Depression,  Phase Of Life Problems, Anxiety  Goals 1. Appropriately grieve the loss in order to normalize mood and to return  to previously adaptive level of functioning. 2. Balance life activities between consideration of others and development of own interests. Objective Apply problem-solving skills to current circumstances Target Date: 2022-10-15 Frequency: Biweekly Progress: 90 Modality: individual Related Interventions 1. Use modeling and role-playing with the client to apply the problem-solving approach to his/her  current circumstances (or assign "  Applying Problem-Solving to Interpersonal Conflict" from  the Adult Psychotherapy Homework Planner by [facility]); encourage implementation of  action  plan, reinforcing success and redirecting for failure. Objective Implement new activities that increase a sense of satisfaction. Target Date: 2022-10-15 Frequency: Biweekly Progress: 90 Modality: individual Related Interventions 1. Develop a plan with the client to include activities that will increase his/her satisfaction, fulfill  his/her values, and improve the quality of his/her life. 3. Develop healthy interpersonal relationships that lead to the alleviation  and help prevent the relapse of depression. Objective Learn and implement problem-solving and decision-making skills. Target Date: 2022-10-15 Frequency: Biweekly Progress: 90 Modality: individual Related Interventions 1. Encourage in the client the development of a positive problem orientation in which problems  and solving them are viewed as a natural part of life and not something to be feared, despaired,  or avoided. 2. Conduct Problem-Solving Therapy  Implement new activities that increase a sense of satisfaction. Target Date: 2022-10-15 Frequency: Biweekly Progress: 90 Modality: individual Related Interventions 1. Develop a plan with the client to include activities that will increase his/her satisfaction, fulfill  his/her values, and improve the quality of his/her life. 3. Develop healthy interpersonal relationships that lead to the alleviation  and help prevent the relapse of depression. Objective Learn and implement problem-solving and decision-making skills. Target Date: 2022-10-15 Frequency: Biweekly Progress: 90 Modality: individual Related Interventions 1. Encourage in the client the development of a positive problem orientation in which problems  and solving them are viewed as a natural part of life and not something to be feared, despaired,  or avoided.  Objective Identify and replace thoughts and beliefs that support depression. Target Date: 2022-10-15 Frequency: Biweekly Progress: 90 Modality:  individual Related Interventions 1. Explore and restructure underlying assumptions and beliefs reflected in biased self-talk that  may put the client at risk for relapse or recurrence. 2. Facilitate and reinforce the client's shift from biased depressive self-talk and beliefs to realitybased cognitive messages that enhance self-confidence and increase adaptive actions (see  "Positive Self-Talk" in the Adult Psychotherapy Homework Planner by Bryn Gulling). 4. Enhance ability to effectively cope with the full variety of life's worries  and anxieties. Objective Identify, challenge, and replace biased, fearful self-talk with positive, realistic, and empowering selftalk. Target Date: 2022-10-15 Frequency: Biweekly Progress: 80 Modality: individual Related Interventions 1. Explore the client's schema and self-talk that mediate his/her fear response; assist him/her in  challenging the biases; replace the distorted messages with reality-based alternatives and  positive, realistic self-talk that will increase his/her self-confidence in coping with irrational  fears (see Cognitive Therapy of Anxiety Disorders by Alison Stalling). Objective Learn and implement problem-solving strategies for realistically addressing worries. Target Date: 2021-10-15 Frequency: Biweekly Progress: 60 Modality: individual Therapy 5. Recognize, accept, and cope with feelings of depression. 6. Resolve conflicted feelings and adapt to the new life circumstances. 7. Stabilize anxiety level while increasing ability to function on a daily  The patient is making progress and has approved this plan of treatment.  Dejaun Vidrio G Raidyn Breiner, LCSW                  Margrete Delude G Copeland Lapier, LCSW               Davinia Riccardi G Napolean Sia, LCSW

## 2021-11-10 ENCOUNTER — Ambulatory Visit (INDEPENDENT_AMBULATORY_CARE_PROVIDER_SITE_OTHER): Payer: Medicare Other | Admitting: Psychology

## 2021-11-10 ENCOUNTER — Other Ambulatory Visit: Payer: Self-pay

## 2021-11-10 ENCOUNTER — Ambulatory Visit
Admission: RE | Admit: 2021-11-10 | Discharge: 2021-11-10 | Disposition: A | Discharge status: Home / Self Care | Payer: Medicare Other | Source: Ambulatory Visit | Attending: Physician Assistant | Admitting: Physician Assistant

## 2021-11-10 DIAGNOSIS — F331 Major depressive disorder, recurrent, moderate: Secondary | ICD-10-CM

## 2021-11-10 DIAGNOSIS — F411 Generalized anxiety disorder: Secondary | ICD-10-CM | POA: Diagnosis not present

## 2021-11-10 DIAGNOSIS — N644 Mastodynia: Secondary | ICD-10-CM | POA: Insufficient documentation

## 2021-11-10 DIAGNOSIS — F4323 Adjustment disorder with mixed anxiety and depressed mood: Secondary | ICD-10-CM | POA: Diagnosis not present

## 2021-11-10 NOTE — Progress Notes (Addendum)
[facility] Counselor/Therapist Progress Note  Patient ID: ITHA KROEKER, MRN: [id number],    Date: 11/10/2021  Time Spent: 60 minutes  Treatment Type: Individual Therapy  Reported Symptoms: anxiety and depression  Mental Status Exam: Appearance:  Casual     Behavior: Appropriate  Motor: Normal  Speech/Language:  Normal Rate  Affect: Blunt  Mood: anxious  Thought process: normal  Thought content:   WNL  Sensory/Perceptual disturbances:   WNL  Orientation: oriented to person, place, time/date, and situation  Attention: Good  Concentration: Good  Memory: WNL  Fund of knowledge:  Good  Insight:   Good  Judgment:  Good  Impulse Control: Good   Risk Assessment: Danger to Self:  No Self-injurious Behavior: No Danger to Others: No Duty to Warn:no Physical Aggression / Violence:No  Access to Firearms a concern: No  Gang Involvement:No   Subjective: The patient attended a face-to-face individual therapy session via video visit.  The patient gave verbal consent to have the session on video on WebEx.  The patient was in her home alone and the therapist was in the office.  The patient presents with a anxious affect and mood is pleasant.  The patient talked about not having her son's dog over the last 10 days and that has been good for her.  We talked about her feeling a little more anxious lately and did some brainstorming about what could be causing her anxiety.  She continues to be in limbo about where she wants to live.  We talked about the importance of her looking at support as she moves forward.  She is still thinking of moving closer to her sister.  We discussed meditation and mindfulness and possibly her doing yoga.  Recommended that she speak with her medication provider about her medications and also about her seeing shapes that make her uncomfortable.  This is likely related to her anxiety.  The patient seems to fear getting older and not being able to care for  herself.  Interventions: Cognitive Behavioral Therapy and Assertiveness/Communication  Diagnosis:Generalized anxiety disorder  Major depressive disorder, recurrent episode, moderate (HCC)  Adjustment disorder with mixed anxiety and depressed mood  Plan:  Client Abilities/Strengths  Intelligent, caring, motivated  Client Treatment Preferences  Outpatient individual therapy  Client Statement of Needs  " I need some help to deal with my situation", "I 'm not sure if I'm depressed or what?"  Treatment Level  Outpatient Individual therapy  Symptoms  Frustration and anxiety related to providing oversight and caretaking to an aging, ailing, and dependent  parent.: (Status: improved). Lack of energy.: (Status:  improved). Motor tension (e.g., restlessness, tiredness, shakiness, muscle tension).:(Status: improved). Poor concentration and indecisiveness.: (Status:  improved). Social withdrawal.: (Status: improved).  Problems Addressed  Unipolar Depression, Phase Of Life Problems, Unipolar Depression, Anxiety, Unipolar Depression,  Phase Of Life Problems, Anxiety  Goals 1. Appropriately grieve the loss in order to normalize mood and to return  to previously adaptive level of functioning. 2. Balance life activities between consideration of others and development of own interests. Objective Apply problem-solving skills to current circumstances Target Date: 2022-10-15 Frequency: Biweekly Progress: 90 Modality: individual Related Interventions 1. Use modeling and role-playing with the client to apply the problem-solving approach to his/her  current circumstances (or assign "Applying Problem-Solving to Interpersonal Conflict" from  the Adult Psychotherapy Homework Planner by [facility]); encourage implementation of action  plan, reinforcing success and redirecting for failure. Objective Implement new activities that increase a sense of satisfaction. Target  Date: 2022-10-15 Frequency:  Biweekly Progress: 90 Modality: individual Related Interventions 1. Develop a plan with the client to include activities that will increase his/her satisfaction, fulfill  his/her values, and improve the quality of his/her life. 3. Develop healthy interpersonal relationships that lead to the alleviation  and help prevent the relapse of depression. Objective Learn and implement problem-solving and decision-making skills. Target Date: 2022-10-15 Frequency: Biweekly Progress: 90 Modality: individual Related Interventions 1. Encourage in the client the development of a positive problem orientation in which problems  and solving them are viewed as a natural part of life and not something to be feared, despaired,  or avoided. 2. Conduct Problem-Solving Therapy  Implement new activities that increase a sense of satisfaction. Target Date: 2022-10-15 Frequency: Biweekly Progress: 90 Modality: individual Related Interventions 1. Develop a plan with the client to include activities that will increase his/her satisfaction, fulfill  his/her values, and improve the quality of his/her life. 3. Develop healthy interpersonal relationships that lead to the alleviation  and help prevent the relapse of depression. Objective Learn and implement problem-solving and decision-making skills. Target Date: 2022-10-15 Frequency: Biweekly Progress: 90 Modality: individual Related Interventions 1. Encourage in the client the development of a positive problem orientation in which problems  and solving them are viewed as a natural part of life and not something to be feared, despaired,  or avoided.  Objective Identify and replace thoughts and beliefs that support depression. Target Date: 2022-10-15 Frequency: Biweekly Progress: 90 Modality: individual Related Interventions 1. Explore and restructure underlying assumptions and beliefs reflected in biased self-talk that  may put the client at risk for relapse or  recurrence. 2. Facilitate and reinforce the client's shift from biased depressive self-talk and beliefs to realitybased cognitive messages that enhance self-confidence and increase adaptive actions (see  "Positive Self-Talk" in the Adult Psychotherapy Homework Planner by Bryn Gulling). 4. Enhance ability to effectively cope with the full variety of life's worries  and anxieties. Objective Identify, challenge, and replace biased, fearful self-talk with positive, realistic, and empowering selftalk. Target Date: 2022-10-15 Frequency: Biweekly Progress: 80 Modality: individual Related Interventions 1. Explore the client's schema and self-talk that mediate his/her fear response; assist him/her in  challenging the biases; replace the distorted messages with reality-based alternatives and  positive, realistic self-talk that will increase his/her self-confidence in coping with irrational  fears (see Cognitive Therapy of Anxiety Disorders by Alison Stalling). Objective Learn and implement problem-solving strategies for realistically addressing worries. Target Date: 2022-10-15 Frequency: Biweekly Progress: 60 Modality: individual Therapy 5. Recognize, accept, and cope with feelings of depression. 6. Resolve conflicted feelings and adapt to the new life circumstances. 7. Stabilize anxiety level while increasing ability to function on a daily  The patient is making progress and has approved this plan of treatment.  Radin Raptis G Dallon Dacosta, LCSW                  Olivianna Higley G Uchenna Seufert, LCSW               Austan Nicholl G Mansour Balboa, LCSW               Fletcher Rathbun G Reeanna Acri, LCSW

## 2021-11-11 ENCOUNTER — Encounter: Payer: Self-pay | Admitting: Psychiatry

## 2021-11-11 ENCOUNTER — Ambulatory Visit (INDEPENDENT_AMBULATORY_CARE_PROVIDER_SITE_OTHER): Payer: Medicare Other | Admitting: Psychiatry

## 2021-11-11 DIAGNOSIS — F411 Generalized anxiety disorder: Secondary | ICD-10-CM | POA: Diagnosis not present

## 2021-11-11 DIAGNOSIS — F331 Major depressive disorder, recurrent, moderate: Secondary | ICD-10-CM

## 2021-11-11 MED ORDER — BUPROPION HCL ER (SR) 100 MG PO TB12: 100.0000 mg | ORAL_TABLET | Freq: Every day | ORAL | 0 refills | Status: DC

## 2021-11-11 MED ORDER — BUPROPION HCL ER (SR) 100 MG PO TB12: 100.0000 mg | ORAL_TABLET | Freq: Every morning | ORAL | 0 refills | Status: DC

## 2021-11-11 MED ORDER — DULOXETINE HCL 30 MG PO CPEP: ORAL_CAPSULE | ORAL | 0 refills | Status: DC

## 2021-11-11 NOTE — Progress Notes (Signed)
Kristin Sparks [id number] Jul 24, 1956 66 y.o.  Subjective:   Patient ID:  Kristin Sparks is a 66 y.o. (DOB 06-06-1956) female.  Chief Complaint:  Chief Complaint  Patient presents with   Depression   Anxiety    HPI LETY CULLENS presents to the office today for follow-up of anxiety and depression. She reports that she started Wellbutrin and she notices some improvement in energy. "I did have some motivation and then it just dropped off." She reports that her interest in things waxes and wanes. She has been thinking about purchasing a new computer and learning some things. No change in depression. Sleep has been ok. She reports that she is trying to set an alarm and make herself get up at the same time. Some decrease in appetite. She reports, "the depression is still there." Concentration seems to be improved. Denies SI.    She reports that she continues to have anxiety. She reports an aversion to certain images and patterns when her anxiety is elevated. She reports that this also caused some sadness. She reports that she has had some increased stress with son's upcoming wedding and adjusting to being a widow. She reports frequently thinking about things, ie. Where she should live, etc.   Has 2 trips planned in April.   Has son's dog most of the time.   Past medication trials: Sertraline-diarrhea.  Felt calmer. Lexapro-prescribed for hot flashes.  Had increased anxiety while taking. Effexor XR-some benefit and then was less effective.  Signs and symptoms inadequately controlled at 75 mg and unable to tolerate higher doses. Cymbalta Buspar Propanolol Ativan Hydroxyzine Lyrica Gabapentin-did not help with neuropathy  Review of Systems:  Review of Systems  Respiratory:         Chest wall pain  Musculoskeletal:  Negative for gait problem.  Neurological:  Negative for tremors and headaches.  Psychiatric/Behavioral:         Please refer to HPI   Medications: I have reviewed the  patient's current medications.  Current Outpatient Medications  Medication Sig Dispense Refill   buPROPion ER (WELLBUTRIN SR) 100 MG 12 hr tablet Take 1 tablet (100 mg total) by mouth daily for 7 days. 7 tablet 0   calcium-vitamin D (OSCAL-500) 500-400 MG-UNIT tablet Take by mouth.     DULoxetine (CYMBALTA) 60 MG capsule Take 1 capsule (60 mg total) by mouth daily. 90 capsule 3   levocetirizine (XYZAL) 5 MG tablet Take 5 mg by mouth daily as needed.      lovastatin (MEVACOR) 20 MG tablet Take 20 mg by mouth once a week.      Multiple Vitamin (MULTIVITAMIN) tablet Take 1 tablet by mouth daily.     Omega-3 1000 MG CAPS Take by mouth.     pregabalin (LYRICA) 75 MG capsule Take 1 capsule (75 mg total) by mouth every evening. (Patient taking differently: Take 75 mg by mouth as needed.) 90 capsule 1   acetaminophen (TYLENOL) 325 MG tablet Take 650 mg by mouth every 6 (six) hours as needed.     albuterol (PROVENTIL HFA;VENTOLIN HFA) 108 (90 Base) MCG/ACT inhaler Inhale into the lungs.     buPROPion ER (WELLBUTRIN SR) 100 MG 12 hr tablet Take 1 tablet (100 mg total) by mouth every morning. 90 tablet 0   busPIRone (BUSPAR) 30 MG tablet Take 0.5 tablets (15 mg total) by mouth every morning AND 1 tablet (30 mg total) at bedtime. (Patient taking differently: 15 mg in the morning) 135 tablet 1  DULoxetine (CYMBALTA) 30 MG capsule TAKE 1 CAPSULE BY MOUTH  DAILY , WITH A 60 MG  CAPSULE TO EQUAL 90 MG  TOTAL DOSE 90 capsule 0   Fluticasone-Salmeterol (ADVAIR DISKUS) 250-50 MCG/DOSE AEPB Inhale into the lungs.     hydrOXYzine (ATARAX/VISTARIL) 10 MG tablet Take 1 tablet (10 mg total) by mouth at bedtime as needed for up to 30 days. (Patient not taking: Reported on 11/11/2021) 30 tablet 2   meloxicam (MOBIC) 7.5 MG tablet Take 7.5 mg by mouth daily. (Patient not taking: Reported on 10/06/2021)     propranolol (INDERAL) 10 MG tablet Take 1/2-1 tab po BID prn anxiety (Do not use if pulse is <70) 60 tablet 1   No  current facility-administered medications for this visit.    Medication Side Effects: Other: Dry mouth  Allergies:  Allergies  Allergen Reactions   Latex    Sulfa Antibiotics Rash    Past Medical History:  Diagnosis Date   Allergic rhinitis    Asthma    Elevated cholesterol    Neuropathy     Past Medical History, Surgical history, Social history, and Family history were reviewed and updated as appropriate.   Please see review of systems for further details on the patient's review from today.   Objective:   Physical Exam:  There were no vitals taken for this visit.  Physical Exam Constitutional:      General: She is not in acute distress. Musculoskeletal:        General: No deformity.  Neurological:     Mental Status: She is alert and oriented to person, place, and time.     Coordination: Coordination normal.  Psychiatric:        Attention and Perception: Attention and perception normal. She does not perceive auditory or visual hallucinations.        Mood and Affect: Mood is anxious and depressed. Affect is not labile, blunt, angry or inappropriate.        Speech: Speech normal.        Behavior: Behavior normal.        Thought Content: Thought content normal. Thought content is not paranoid or delusional. Thought content does not include homicidal or suicidal ideation. Thought content does not include homicidal or suicidal plan.        Cognition and Memory: Cognition and memory normal.        Judgment: Judgment normal.     Comments: Insight intact    Lab Review:  No results found for: NA, K, CL, CO2, GLUCOSE, BUN, CREATININE, CALCIUM, PROT, ALBUMIN, AST, ALT, ALKPHOS, BILITOT, GFRNONAA, GFRAA  No results found for: WBC, RBC, HGB, HCT, PLT, MCV, MCH, MCHC, RDW, LYMPHSABS, MONOABS, EOSABS, BASOSABS  No results found for: POCLITH, LITHIUM   No results found for: PHENYTOIN, PHENOBARB, VALPROATE, CBMZ   .res Assessment: Plan:    Pt seen for 30 minutes and time  spent discussing anxiety s/s and possible treatment options. Discussed potential benefits, risks, and side effects of increasing Cymbalta to improve anxiety. Discussed that doses above 60 mg are considered off-label. Pt agrees to increased dose of Cymbalta to 90 mg to improve mood and anxiety.  Will continue Wellbutrin SR 100 mg every morning for depression since she has noticed some improvement in energy and motivation with Wellbutrin SR. Recommend continuing therapy with Bambi Cottle, LCSW.  Pt to follow-up with this provider in 6 weeks or sooner if clinically indicated.  Patient advised to contact office with any questions, adverse effects,  or acute worsening in signs and symptoms.  Jayni was seen today for depression and anxiety.  Diagnoses and all orders for this visit:  Generalized anxiety disorder -     DULoxetine (CYMBALTA) 30 MG capsule; TAKE 1 CAPSULE BY MOUTH  DAILY , WITH A 60 MG  CAPSULE TO EQUAL 90 MG  TOTAL DOSE  Major depressive disorder, recurrent episode, moderate (HCC) -     buPROPion ER (WELLBUTRIN SR) 100 MG 12 hr tablet; Take 1 tablet (100 mg total) by mouth every morning. -     buPROPion ER (WELLBUTRIN SR) 100 MG 12 hr tablet; Take 1 tablet (100 mg total) by mouth daily for 7 days.     Please see After Visit Summary for patient specific instructions.  Future Appointments  Date Time Provider [place]  11/24/2021  9:00 AM Cottle, Lucious Groves, LCSW [facility] None  12/08/2021  9:00 AM Cottle, Bambi G, LCSW [facility] None  12/22/2021  9:00 AM Cottle, Bambi G, LCSW [facility] None  12/23/2021 10:00 AM Thayer Headings, PMHNP [facility] None  01/05/2022  9:00 AM Cottle, Bambi G, LCSW [facility] None    No orders of the defined types were placed in this encounter.   -------------------------------

## 2021-11-16 ENCOUNTER — Other Ambulatory Visit: Payer: Self-pay | Admitting: Psychiatry

## 2021-11-16 DIAGNOSIS — F331 Major depressive disorder, recurrent, moderate: Secondary | ICD-10-CM

## 2021-11-17 ENCOUNTER — Other Ambulatory Visit: Payer: Self-pay | Admitting: Psychiatry

## 2021-11-17 DIAGNOSIS — F331 Major depressive disorder, recurrent, moderate: Secondary | ICD-10-CM

## 2021-11-18 ENCOUNTER — Telehealth: Payer: Self-pay | Admitting: Psychiatry

## 2021-11-18 DIAGNOSIS — F331 Major depressive disorder, recurrent, moderate: Secondary | ICD-10-CM

## 2021-11-18 MED ORDER — BUPROPION HCL ER (SR) 100 MG PO TB12: 100.0000 mg | ORAL_TABLET | Freq: Every day | ORAL | 0 refills | Status: DC

## 2021-11-18 NOTE — Telephone Encounter (Signed)
Next visit is 12/23/21. Kristin Sparks called and said that she is still waiting on her Wellbutrin to come in from her mail order pharmacy. Can she get a 7 day prescription for Wellbutrin called into her pharmacy to last her until it is delivered? Pharmacy is:   [id number] [name], [place]  Phone:  [phone]  Fax:  [phone]

## 2021-11-24 ENCOUNTER — Ambulatory Visit (INDEPENDENT_AMBULATORY_CARE_PROVIDER_SITE_OTHER): Payer: Medicare Other | Admitting: Psychology

## 2021-11-24 DIAGNOSIS — F331 Major depressive disorder, recurrent, moderate: Secondary | ICD-10-CM

## 2021-11-24 DIAGNOSIS — F411 Generalized anxiety disorder: Secondary | ICD-10-CM

## 2021-11-24 DIAGNOSIS — F4323 Adjustment disorder with mixed anxiety and depressed mood: Secondary | ICD-10-CM

## 2021-11-24 NOTE — Progress Notes (Signed)
[place] Counselor/Therapist Progress Note  Patient ID: Kristin Sparks, MRN: [id number],    Date: 11/24/2021  Time Spent: 60 minutes  Treatment Type: Individual Therapy  Reported Symptoms: anxiety and depression  Mental Status Exam: Appearance:  Casual     Behavior: Appropriate  Motor: Normal  Speech/Language:  Normal Rate  Affect: Blunt  Mood: anxious  Thought process: normal  Thought content:   WNL  Sensory/Perceptual disturbances:   WNL  Orientation: oriented to person, place, time/date, and situation  Attention: Good  Concentration: Good  Memory: WNL  Fund of knowledge:  Good  Insight:   Good  Judgment:  Good  Impulse Control: Good   Risk Assessment: Danger to Self:  No Self-injurious Behavior: No Danger to Others: No Duty to Warn:no Physical Aggression / Violence:No  Access to Firearms a concern: No  Gang Involvement:No   Subjective: The patient attended a face-to-face individual therapy session via video visit.  The patient gave verbal consent to have the session on video on WebEx.  The patient was in her sisters home alone and the therapist was in the office.  The patient presents with a blunted affect and mood is pleasant.  The patient states that she is down at her sister's for about a week.  She says that they have plans to go to a birthday party this evening.  The patient talked about her son's wedding and they are in the throes of planning this.  We talked about some of the trips that she has coming up and she is planning to go to [place] in April and her sister is coming next week to her house and they are doing more things socially.  I recommended that she wait until she gets the wedding over with to really make any big plans to do anything about moving.  The patient is also talking about possibly planning a trip to [place] or to [place].  The patient does seem to be making some movement in regards to finding her own way in her  retirement.  Interventions: Cognitive Behavioral Therapy and Assertiveness/Communication  Diagnosis:Major depressive disorder, recurrent episode, moderate (HCC)  Generalized anxiety disorder  Adjustment disorder with mixed anxiety and depressed mood  Plan:  Client Abilities/Strengths  Intelligent, caring, motivated  Client Treatment Preferences  Outpatient individual therapy  Client Statement of Needs  " I need some help to deal with my situation", "I 'm not sure if I'm depressed or what?"  Treatment Level  Outpatient Individual therapy  Symptoms  Frustration and anxiety related to providing oversight and caretaking to an aging, ailing, and dependent  parent.: No Description Entered (Status: improved). Lack of energy.: No Description Entered (Status:  improved). Motor tension (e.g., restlessness, tiredness, shakiness, muscle tension).: No Description  Entered (Status: improved). Poor concentration and indecisiveness.: No Description Entered (Status:  improved). Social withdrawal.: No Description Entered (Status: improved).  Problems Addressed  Unipolar Depression, Phase Of Life Problems, Unipolar Depression, Anxiety, Unipolar Depression,  Phase Of Life Problems, Anxiety  Goals 1. Appropriately grieve the loss in order to normalize mood and to return  to previously adaptive level of functioning. 2. Balance life activities between consideration of others and development of own interests. Objective Apply problem-solving skills to current circumstances Target Date: 2022-10-15 Frequency: Biweekly Progress: 90 Modality: individual Related Interventions 1. Use modeling and role-playing with the client to apply the problem-solving approach to his/her  current circumstances (or assign "Applying Problem-Solving to Interpersonal Conflict" from  the Adult Psychotherapy Homework Planner  by Bryn Gulling); encourage implementation of action  plan, reinforcing success and redirecting for  failure. Objective Implement new activities that increase a sense of satisfaction. Target Date: 2022-10-15 Frequency: Biweekly Progress: 90 Modality: individual Related Interventions 1. Develop a plan with the client to include activities that will increase his/her satisfaction, fulfill  his/her values, and improve the quality of his/her life. 3. Develop healthy interpersonal relationships that lead to the alleviation  and help prevent the relapse of depression. Objective Learn and implement problem-solving and decision-making skills. Target Date: 2022-10-15 Frequency: Biweekly Progress: 90 Modality: individual Related Interventions 1. Encourage in the client the development of a positive problem orientation in which problems  and solving them are viewed as a natural part of life and not something to be feared, despaired,  or avoided. 2. Conduct Problem-Solving Therapy  Implement new activities that increase a sense of satisfaction. Target Date: 2022-10-15 Frequency: Biweekly Progress: 90 Modality: individual Related Interventions 1. Develop a plan with the client to include activities that will increase his/her satisfaction, fulfill  his/her values, and improve the quality of his/her life. 3. Develop healthy interpersonal relationships that lead to the alleviation  and help prevent the relapse of depression. Objective Learn and implement problem-solving and decision-making skills. Target Date: 2022-10-15 Frequency: Biweekly Progress: 90 Modality: individual Related Interventions 1. Encourage in the client the development of a positive problem orientation in which problems  and solving them are viewed as a natural part of life and not something to be feared, despaired,  or avoided.  Objective Identify and replace thoughts and beliefs that support depression. Target Date: 2022-10-15 Frequency: Biweekly Progress: 90 Modality: individual Related Interventions 1. Explore and  restructure underlying assumptions and beliefs reflected in biased self-talk that  may put the client at risk for relapse or recurrence. 2. Facilitate and reinforce the client's shift from biased depressive self-talk and beliefs to realitybased cognitive messages that enhance self-confidence and increase adaptive actions (see  "Positive Self-Talk" in the Adult Psychotherapy Homework Planner by Bryn Gulling). 4. Enhance ability to effectively cope with the full variety of life's worries  and anxieties. Objective Identify, challenge, and replace biased, fearful self-talk with positive, realistic, and empowering selftalk. Target Date: 2022-10-15 Frequency: Biweekly Progress: 80 Modality: individual Related Interventions 1. Explore the client's schema and self-talk that mediate his/her fear response; assist him/her in  challenging the biases; replace the distorted messages with reality-based alternatives and  positive, realistic self-talk that will increase his/her self-confidence in coping with irrational  fears (see Cognitive Therapy of Anxiety Disorders by Alison Stalling). Objective Learn and implement problem-solving strategies for realistically addressing worries. Target Date: 2022-10-15 Frequency: Biweekly Progress: 60 Modality: individual Therapy 5. Recognize, accept, and cope with feelings of depression. 6. Resolve conflicted feelings and adapt to the new life circumstances. 7. Stabilize anxiety level while increasing ability to function on a daily  The patient is making progress and has approved this plan of treatment.  Donathan Buller G Traquan Duarte, LCSW                  Annalia Metzger G Aviance [place], LCSW               Caci Orren G Vivia [place], LCSW               Brytney Somes G Corabelle Spackman, LCSW

## 2021-12-08 ENCOUNTER — Ambulatory Visit (INDEPENDENT_AMBULATORY_CARE_PROVIDER_SITE_OTHER): Payer: Medicare Other | Admitting: Psychology

## 2021-12-08 DIAGNOSIS — F331 Major depressive disorder, recurrent, moderate: Secondary | ICD-10-CM | POA: Diagnosis not present

## 2021-12-08 DIAGNOSIS — F411 Generalized anxiety disorder: Secondary | ICD-10-CM | POA: Diagnosis not present

## 2021-12-08 DIAGNOSIS — F4323 Adjustment disorder with mixed anxiety and depressed mood: Secondary | ICD-10-CM

## 2021-12-09 NOTE — Progress Notes (Signed)
[place] Counselor/Therapist Progress Note ? ?Patient ID: Kristin Sparks, MRN: [id number],   ? ?Date: 12/09/2021 ? ?Time Spent: 60 minutes ? ?Treatment Type: Individual Therapy ? ?Reported Symptoms: anxiety and depression ? ?Mental Status Exam: ?Appearance:  Casual     ?Behavior: Appropriate  ?Motor: Normal  ?Speech/Language:  Normal Rate  ?Affect: Blunt  ?Mood: anxious  ?Thought process: normal  ?Thought content:   WNL  ?Sensory/Perceptual disturbances:   WNL  ?Orientation: oriented to person, place, time/date, and situation  ?Attention: Good  ?Concentration: Good  ?Memory: WNL  ?Fund of knowledge:  Good  ?Insight:   Good  ?Judgment:  Good  ?Impulse Control: Good  ? ?Risk Assessment: ?Danger to Self:  No ?Self-injurious Behavior: No ?Danger to Others: No ?Duty to Warn:no ?Physical Aggression / Violence:No  ?Access to Firearms a concern: No  ?Gang Involvement:No  ? ?Subjective: The patient attended a face-to-face individual therapy session via video visit.  The patient gave verbal consent to have the session on video on WebEx.  The patient was in her home alone and the therapist was in the office.  The patient presents with a blunted affect and mood is anxious.  The patient reports that she is a little anxious about the upcoming wedding of her son.  She reported that she went to a cocktail shower for them and felt very anxious.  We talked about her having propanolol for emergency anxiety.  She has not used it as of this time.  I encouraged her to try it to see if it helps with her anxiety prior to the next big event for the wedding.  The patient states that she is getting anxious because she has to do the rehearsal dinner.  She does have everything planned, but feels nervous that things will go awry.  We talked about her continuing to explore what she wants to do moving forward and where she would like to live.  The patient does feel like she would like to move out of [place] however she is  immobilized by the decision.  We will continue to help her problem solve and deal with her anxiety. ? ?Interventions: Cognitive Behavioral Therapy and Assertiveness/Communication ? ?Diagnosis:Major depressive disorder, recurrent episode, moderate ([place]) ? ?Generalized anxiety disorder ? ?Adjustment disorder with mixed anxiety and depressed mood ? ?Plan: ? ?Client Abilities/Strengths  ?Intelligent, caring, motivated  ?Client Treatment Preferences  ?Outpatient individual therapy  ?Client Statement of Needs  ?" I need some help to deal with my situation", "I 'm not sure if I'm depressed or what?"  ?Treatment Level  ?Outpatient Individual therapy  ?Symptoms  ?Frustration and anxiety related to providing oversight and caretaking to an aging, ailing, and dependent  ?parent.: No Description Entered (Status: improved). Lack of energy.: No Description Entered (Status:  ?improved). Motor tension (e.g., restlessness, tiredness, shakiness, muscle tension).: No Description  ?Entered (Status: improved). Poor concentration and indecisiveness.: No Description Entered (Status:  ?improved). Social withdrawal.: No Description Entered (Status: improved).  ?Problems Addressed  ?Unipolar Depression, Phase Of Life Problems, Unipolar Depression, Anxiety, Unipolar Depression,  ?Phase Of Life Problems, Anxiety  ?Goals ?1. Appropriately grieve the loss in order to normalize mood and to return  ?to previously adaptive level of functioning. ?2. Balance life activities between consideration of others and development ?of own interests. ?Objective ?Apply problem-solving skills to current circumstances ?Target Date: 2022-10-15 Frequency: Biweekly ?Progress: 90 Modality: individual ?Related Interventions ?1. Use modeling and role-playing with the client to apply the problem-solving approach to his/her  ?  current circumstances (or assign "Applying Problem-Solving to Interpersonal Conflict" from  ?the Adult Psychotherapy Homework Planner by Bryn Gulling);  encourage implementation of action  ?plan, reinforcing success and redirecting for failure. ?Objective ?Implement new activities that increase a sense of satisfaction. ?Target Date: 2022-10-15 Frequency: Biweekly ?Progress: 90 Modality: individual ?Related Interventions ?1. Develop a plan with the client to include activities that will increase his/her satisfaction, fulfill  ?his/her values, and improve the quality of his/her life. ?3. Develop healthy interpersonal relationships that lead to the alleviation  ?and help prevent the relapse of depression. ?Objective ?Learn and implement problem-solving and decision-making skills. ?Target Date: 2022-10-15 Frequency: Biweekly ?Progress: 90 Modality: individual ?Related Interventions ?1. Encourage in the client the development of a positive problem orientation in which problems  ?and solving them are viewed as a natural part of life and not something to be feared, despaired,  ?or avoided. ?2. Conduct Problem-Solving Therapy  ?Implement new activities that increase a sense of satisfaction. ?Target Date: 2022-10-15 Frequency: Biweekly ?Progress: 90 Modality: individual ?Related Interventions ?1. Develop a plan with the client to include activities that will increase his/her satisfaction, fulfill  ?his/her values, and improve the quality of his/her life. ?3. Develop healthy interpersonal relationships that lead to the alleviation  ?and help prevent the relapse of depression. ?Objective ?Learn and implement problem-solving and decision-making skills. ?Target Date: 2022-10-15 Frequency: Biweekly ?Progress: 90 Modality: individual ?Related Interventions ?1. Encourage in the client the development of a positive problem orientation in which problems  ?and solving them are viewed as a natural part of life and not something to be feared, despaired,  ?or avoided. ? ?Objective ?Identify and replace thoughts and beliefs that support depression. ?Target Date: 2022-10-15 Frequency:  Biweekly ?Progress: 90 Modality: individual ?Related Interventions ?1. Explore and restructure underlying assumptions and beliefs reflected in biased self-talk that  ?may put the client at risk for relapse or recurrence. ?2. Facilitate and reinforce the client's shift from biased depressive self-talk and beliefs to realitybased cognitive messages that enhance self-confidence and increase adaptive actions (see  ?"Positive Self-Talk" in the Adult Psychotherapy Homework Planner by [facility]). ?4. Enhance ability to effectively cope with the full variety of life's worries  ?and anxieties. ?Objective ?Identify, challenge, and replace biased, fearful self-talk with positive, realistic, and empowering selftalk. ?Target Date: 2022-10-15 Frequency: Biweekly ?Progress: 80 Modality: individual ?Related Interventions ?1. Explore the client's schema and self-talk that mediate his/her fear response; assist him/her in  ?challenging the biases; replace the distorted messages with reality-based alternatives and  ?positive, realistic self-talk that will increase his/her self-confidence in coping with irrational  ?fears (see Cognitive Therapy of Anxiety Disorders by Alison Stalling). ?Objective ?Learn and implement problem-solving strategies for realistically addressing worries. ?Target Date: 2022-10-15 Frequency: Biweekly ?Progress: 60 Modality: individual Therapy ?5. Recognize, accept, and cope with feelings of depression. ?6. Resolve conflicted feelings and adapt to the new life circumstances. ?7. Stabilize anxiety level while increasing ability to function on a daily ? ?The patient is making progress and has approved this plan of treatment. ? ?Raymona Boss G Mariany Mackintosh, LCSW ? ? ? ? ? ? ? ? ? ? ? ? ? ? ? ? ? ?Salmaan Patchin G Keith Felten, LCSW ? ? ? ? ? ? ? ? ? ? ? ? ? ? ?Selso Mannor G Diron Haddon, LCSW ? ? ? ? ? ? ? ? ? ? ? ? ? ? ?Sanye Ledesma G Dae Antonucci, LCSW ? ? ? ? ? ? ? ? ? ? ? ? ? ? ?Martin Smeal G Ajit Errico, LCSW ?

## 2021-12-22 ENCOUNTER — Ambulatory Visit (INDEPENDENT_AMBULATORY_CARE_PROVIDER_SITE_OTHER): Payer: Medicare Other | Admitting: Psychology

## 2021-12-22 DIAGNOSIS — F331 Major depressive disorder, recurrent, moderate: Secondary | ICD-10-CM | POA: Diagnosis not present

## 2021-12-22 DIAGNOSIS — F411 Generalized anxiety disorder: Secondary | ICD-10-CM

## 2021-12-22 DIAGNOSIS — F4323 Adjustment disorder with mixed anxiety and depressed mood: Secondary | ICD-10-CM

## 2021-12-22 NOTE — Progress Notes (Signed)
[place] Counselor/Therapist Progress Note ? ?Patient ID: LAYAN ZALENSKI, MRN: [id number],   ? ?Date: 12/22/2021 ? ?Time Spent: 60 minutes ? ?Treatment Type: Individual Therapy ? ?Reported Symptoms: anxiety and depression ? ?Mental Status Exam: ?Appearance:  Casual     ?Behavior: Appropriate  ?Motor: Normal  ?Speech/Language:  Normal Rate  ?Affect: Blunt  ?Mood: anxious  ?Thought process: normal  ?Thought content:   WNL  ?Sensory/Perceptual disturbances:   WNL  ?Orientation: oriented to person, place, time/date, and situation  ?Attention: Good  ?Concentration: Good  ?Memory: WNL  ?Fund of knowledge:  Good  ?Insight:   Good  ?Judgment:  Good  ?Impulse Control: Good  ? ?Risk Assessment: ?Danger to Self:  No ?Self-injurious Behavior: No ?Danger to Others: No ?Duty to Warn:no ?Physical Aggression / Violence:No  ?Access to Firearms a concern: No  ?Gang Involvement:No  ? ?Subjective: The patient attended a face-to-face individual therapy session via video visit.  The patient gave verbal consent to have the session on video on WebEx.  The patient was in her home alone and the therapist was in the office.  The patient presents with a blunted affect and mood is anxious.  The patient reports that she has some trips coming up to [facility] in [place].  This is good that she has something scheduled.  The patient talked about her anxiety of hosting her son's rehearsal  dinner.  We did some problem solving to help her decide how she will handle that evening.  I recommended that she cut her anxiety the med in half and try it to see what it does in regards to her anxiety.  She had said that she would do that the last time and has not done that yet because she is anxious.  The patient talks to [employer] tomorrow. ? ?Interventions: Cognitive Behavioral Therapy and Assertiveness/Communication ? ?Diagnosis:Major depressive disorder, recurrent episode, moderate ([place]) ? ?Generalized anxiety  disorder ? ?Adjustment disorder with mixed anxiety and depressed mood ? ?Plan: ? ?Client Abilities/Strengths  ?Intelligent, caring, motivated  ?Client Treatment Preferences  ?Outpatient individual therapy  ?Client Statement of Needs  ?" I need some help to deal with my situation", "I 'm not sure if I'm depressed or what?"  ?Treatment Level  ?Outpatient Individual therapy  ?Symptoms  ?Frustration and anxiety related to providing oversight and caretaking to an aging, ailing, and dependent  ?parent.: No Description Entered (Status: improved). Lack of energy.: No Description Entered (Status:  ?improved). Motor tension (e.g., restlessness, tiredness, shakiness, muscle tension).: No Description  ?Entered (Status: improved). Poor concentration and indecisiveness.: No Description Entered (Status:  ?improved). Social withdrawal.: No Description Entered (Status: improved).  ?Problems Addressed  ?Unipolar Depression, Phase Of Life Problems, Unipolar Depression, Anxiety, Unipolar Depression,  ?Phase Of Life Problems, Anxiety  ?Goals ?1. Appropriately grieve the loss in order to normalize mood and to return  ?to previously adaptive level of functioning. ?2. Balance life activities between consideration of others and development ?of own interests. ?Objective ?Apply problem-solving skills to current circumstances ?Target Date: 2022-10-15 Frequency: Biweekly ?Progress: 90 Modality: individual ?Related Interventions ?1. Use modeling and role-playing with the client to apply the problem-solving approach to his/her  ?current circumstances (or assign "Applying Problem-Solving to Interpersonal Conflict" from  ?the Adult Psychotherapy Homework Planner by Bryn Gulling); encourage implementation of action  ?plan, reinforcing success and redirecting for failure. ?Objective ?Implement new activities that increase a sense of satisfaction. ?Target Date: 2022-10-15 Frequency: Biweekly ?Progress: 90 Modality: individual ?Related Interventions ?1.  Develop  a plan with the client to include activities that will increase his/her satisfaction, fulfill  ?his/her values, and improve the quality of his/her life. ?3. Develop healthy interpersonal relationships that lead to the alleviation  ?and help prevent the relapse of depression. ?Objective ?Learn and implement problem-solving and decision-making skills. ?Target Date: 2022-10-15 Frequency: Biweekly ?Progress: 90 Modality: individual ?Related Interventions ?1. Encourage in the client the development of a positive problem orientation in which problems  ?and solving them are viewed as a natural part of life and not something to be feared, despaired,  ?or avoided. ?2. Conduct Problem-Solving Therapy  ?Implement new activities that increase a sense of satisfaction. ?Target Date: 2022-10-15 Frequency: Biweekly ?Progress: 90 Modality: individual ?Related Interventions ?1. Develop a plan with the client to include activities that will increase his/her satisfaction, fulfill  ?his/her values, and improve the quality of his/her life. ?3. Develop healthy interpersonal relationships that lead to the alleviation  ?and help prevent the relapse of depression. ?Objective ?Learn and implement problem-solving and decision-making skills. ?Target Date: 2022-10-15 Frequency: Biweekly ?Progress: 90 Modality: individual ?Related Interventions ?1. Encourage in the client the development of a positive problem orientation in which problems  ?and solving them are viewed as a natural part of life and not something to be feared, despaired,  ?or avoided. ? ?Objective ?Identify and replace thoughts and beliefs that support depression. ?Target Date: 2022-10-15 Frequency: Biweekly ?Progress: 90 Modality: individual ?Related Interventions ?1. Explore and restructure underlying assumptions and beliefs reflected in biased self-talk that  ?may put the client at risk for relapse or recurrence. ?2. Facilitate and reinforce the client's shift from  biased depressive self-talk and beliefs to realitybased cognitive messages that enhance self-confidence and increase adaptive actions (see  ?"Positive Self-Talk" in the Adult Psychotherapy Homework Planner by [facility]). ?4. Enhance ability to effectively cope with the full variety of life's worries  ?and anxieties. ?Objective ?Identify, challenge, and replace biased, fearful self-talk with positive, realistic, and empowering selftalk. ?Target Date: 2022-10-15 Frequency: Biweekly ?Progress: 80 Modality: individual ?Related Interventions ?1. Explore the client's schema and self-talk that mediate his/her fear response; assist him/her in  ?challenging the biases; replace the distorted messages with reality-based alternatives and  ?positive, realistic self-talk that will increase his/her self-confidence in coping with irrational  ?fears (see Cognitive Therapy of Anxiety Disorders by Alison Stalling). ?Objective ?Learn and implement problem-solving strategies for realistically addressing worries. ?Target Date: 2022-10-15 Frequency: Biweekly ?Progress: 60 Modality: individual Therapy ?5. Recognize, accept, and cope with feelings of depression. ?6. Resolve conflicted feelings and adapt to the new life circumstances. ?7. Stabilize anxiety level while increasing ability to function on a daily ? ?The patient is making progress and has approved this plan of treatment. ? ?Kimyah Frein G Melvia Matousek, LCSW ? ? ? ? ? ? ? ? ? ? ? ? ? ? ? ? ? ?Rydell Wiegel G Kashauna Celmer, LCSW ? ? ? ? ? ? ? ? ? ? ? ? ? ? ?Jalon Squier G Breven Guidroz, LCSW ? ? ? ? ? ? ? ? ? ? ? ? ? ? ?Tirrell Buchberger G Johncarlo Maalouf, LCSW ? ? ? ? ? ? ? ? ? ? ? ? ? ? ?Treshaun Carrico G Hamdi Kley, LCSW ? ? ? ? ? ? ? ? ? ? ? ? ? ? ?Boen Sterbenz G Sebrina Kessner, LCSW ?

## 2021-12-23 ENCOUNTER — Encounter: Payer: Self-pay | Admitting: Psychiatry

## 2021-12-23 ENCOUNTER — Telehealth (INDEPENDENT_AMBULATORY_CARE_PROVIDER_SITE_OTHER): Payer: Medicare Other | Admitting: Psychiatry

## 2021-12-23 DIAGNOSIS — F411 Generalized anxiety disorder: Secondary | ICD-10-CM | POA: Diagnosis not present

## 2021-12-23 DIAGNOSIS — F331 Major depressive disorder, recurrent, moderate: Secondary | ICD-10-CM

## 2021-12-23 MED ORDER — DULOXETINE HCL 60 MG PO CPEP: 60.0000 mg | ORAL_CAPSULE | Freq: Every morning | ORAL | 1 refills | Status: DC

## 2021-12-23 MED ORDER — DULOXETINE HCL 30 MG PO CPEP: 30.0000 mg | ORAL_CAPSULE | Freq: Every evening | ORAL | 1 refills | Status: DC

## 2021-12-23 MED ORDER — LORAZEPAM 0.5 MG PO TABS: ORAL_TABLET | ORAL | 0 refills | Status: DC

## 2021-12-23 MED ORDER — BUPROPION HCL ER (SR) 100 MG PO TB12: 100.0000 mg | ORAL_TABLET | Freq: Every morning | ORAL | 0 refills | Status: DC

## 2021-12-23 NOTE — Progress Notes (Signed)
Kristin Sparks ?[id number] ?03/04/1956 ?66 y.o. ? ?Virtual Visit via Video Note ? ?I connected with pt @ on 12/23/21 at 10:00 AM EDT by a video enabled telemedicine application and verified that I am speaking with the correct person using two identifiers. ?  ?I discussed the limitations of evaluation and management by telemedicine and the availability of in person appointments. The patient expressed understanding and agreed to proceed. ? ?I discussed the assessment and treatment plan with the patient. The patient was provided an opportunity to ask questions and all were answered. The patient agreed with the plan and demonstrated an understanding of the instructions. ?  ?The patient was advised to call back or seek an in-person evaluation if the symptoms worsen or if the condition fails to improve as anticipated. ? ?I provided 30 minutes of non-face-to-face time during this encounter.  The patient was located at home.  The provider was located at [place]. ? ? ?Kristin Sparks, PMHNP  ? ?Subjective:  ? ?Patient ID:  Kristin Sparks is a 66 y.o. (DOB [birth year]) female. ? ?Chief Complaint:  ?Chief Complaint  ?Patient presents with  ? Anxiety  ? Depression  ? ? ?Anxiety ? ? ? ?Depression ?       Past medical history includes anxiety.   ?Kristin Sparks presents for follow-up of anxiety and depression. She reports that increase "seems to be going well." She has been taking Cymbalta 60 mg in the morning and 30 mg in the evening.  ? ?"Feeling a little bit better" with depression. She reports sad mood "still comes and goes." Notices some occ sadness in response to being alone at home. "As long as I have got something planned" then mood has been ok. She reports that she has been able to focus better with Wellbutrin and notices details. She reports that she continues to have some difficulty falling asleep and will toss and turn for about an hour before falling asleep. Denies any worsening insomnia. She reports  that Xyzal helps with her sleep. She reports that her energy has been better and is sleeping less during the day. Motivation has been good and reports that she has been working on different projects that she kept putting off. Has been doing some yard work.  ?She reports that she has chronic anxiety and reports that her sister, mother, and grandmother have had similar symptoms. She reports that she has some chronic generalized anxiety and this may be slightly improved. She has some anxiety about hosting rehearsal dinner, particularly when thinking about speaking in front of the group. She would take Propranolol prn in the past when she had to go to court for work. Denies SI.  ? ?Son is getting married May 20th. She will be hosting the rehearsal dinner.  ? ?Past medication trials: ?Sertraline-diarrhea.  Felt calmer. ?Lexapro-prescribed for hot flashes.  Had increased anxiety while taking. ?Effexor XR-some benefit and then was less effective.  Signs and symptoms inadequately controlled at 75 mg and unable to tolerate higher doses. ?Cymbalta ?Buspar ?Propanolol ?Ativan ?Hydroxyzine ?Lyrica ?Gabapentin-did not help with neuropathy ? ? ?Review of Systems:  ?Review of Systems  ?Constitutional:  Negative for diaphoresis.  ?Musculoskeletal:  Negative for gait problem.  ?Neurological:  Negative for tremors.  ?Psychiatric/Behavioral:  Positive for depression.   ?     Please refer to HPI  ? ?She reports that she has not needed to take as much ibuprofen prn since taking higher dose of Cymbalta.  ? ?Medications: I have  reviewed the patient's current medications. ? ?Current Outpatient Medications  ?Medication Sig Dispense Refill  ? levocetirizine (XYZAL) 5 MG tablet Take 5 mg by mouth daily as needed.     ? LORazepam (ATIVAN) 0.5 MG tablet Take 1/2-1 tab po q 6 hours as needed for anxiety 15 tablet 0  ? lovastatin (MEVACOR) 20 MG tablet Take 20 mg by mouth once a week.     ? acetaminophen (TYLENOL) 325 MG tablet Take 650 mg by  mouth every 6 (six) hours as needed.    ? albuterol (PROVENTIL HFA;VENTOLIN HFA) 108 (90 Base) MCG/ACT inhaler Inhale into the lungs.    ? buPROPion ER (WELLBUTRIN SR) 100 MG 12 hr tablet Take 1 tablet (100 mg total) by mouth every morning. 90 tablet 0  ? busPIRone (BUSPAR) 30 MG tablet Take 0.5 tablets (15 mg total) by mouth every morning AND 1 tablet (30 mg total) at bedtime. (Patient taking differently: 15 mg in the morning) 135 tablet 1  ? calcium-vitamin D (OSCAL-500) 500-400 MG-UNIT tablet Take by mouth.    ? DULoxetine (CYMBALTA) 30 MG capsule Take 1 capsule (30 mg total) by mouth every evening. 90 capsule 1  ? DULoxetine (CYMBALTA) 60 MG capsule Take 1 capsule (60 mg total) by mouth every morning. 90 capsule 1  ? Fluticasone-Salmeterol (ADVAIR DISKUS) 250-50 MCG/DOSE AEPB Inhale into the lungs.    ? hydrOXYzine (ATARAX/VISTARIL) 10 MG tablet Take 1 tablet (10 mg total) by mouth at bedtime as needed for up to 30 days. (Patient not taking: Reported on 11/11/2021) 30 tablet 2  ? meloxicam (MOBIC) 7.5 MG tablet Take 7.5 mg by mouth daily. (Patient not taking: Reported on 10/06/2021)    ? Multiple Vitamin (MULTIVITAMIN) tablet Take 1 tablet by mouth daily.    ? Omega-3 1000 MG CAPS Take by mouth.    ? pregabalin (LYRICA) 75 MG capsule Take 1 capsule (75 mg total) by mouth every evening. (Patient taking differently: Take 75 mg by mouth as needed.) 90 capsule 1  ? propranolol (INDERAL) 10 MG tablet Take 1/2-1 tab po BID prn anxiety (Do not use if pulse is <70) 60 tablet 1  ? ?No current facility-administered medications for this visit.  ? ? ?Medication Side Effects: None ? ?Allergies:  ?Allergies  ?Allergen Reactions  ? Latex   ? Sulfa Antibiotics Rash  ? ? ?Past Medical History:  ?Diagnosis Date  ? Allergic rhinitis   ? Asthma   ? Elevated cholesterol   ? Neuropathy   ? ? ?Family History  ?Problem Relation Age of Onset  ? Alcohol abuse Father   ? Depression Sister   ? Breast cancer Maternal Grandmother   ? ? ?Social  History  ? ?Socioeconomic History  ? Marital status: Widowed  ?  Spouse name: Not on file  ? Number of children: Not on file  ? Years of education: Not on file  ? Highest education level: Not on file  ?Occupational History  ? Not on file  ?Tobacco Use  ? Smoking status: Never  ? Smokeless tobacco: Never  ?Substance and Sexual Activity  ? Alcohol use: Not on file  ? Drug use: Not on file  ? Sexual activity: Not on file  ?Other Topics Concern  ? Not on file  ?Social History Narrative  ? Not on file  ? ?Social Determinants of Health  ? ?Financial Resource Strain: Not on file  ?Food Insecurity: Not on file  ?Transportation Needs: Not on file  ?Physical Activity: Not on  file  ?Stress: Not on file  ?Social Connections: Not on file  ?Intimate Partner Violence: Not on file  ? ? ?Past Medical History, Surgical history, Social history, and Family history were reviewed and updated as appropriate.  ? ?Please see review of systems for further details on the patient's review from today.  ? ?Objective:  ? ?Physical Exam:  ?There were no vitals taken for this visit. ? ?Physical Exam ?Constitutional:   ?   General: She is not in acute distress. ?Musculoskeletal:     ?   General: No deformity.  ?Neurological:  ?   Mental Status: She is alert and oriented to person, place, and time.  ?   Coordination: Coordination normal.  ?Psychiatric:     ?   Attention and Perception: Attention and perception normal. She does not perceive auditory or visual hallucinations.     ?   Mood and Affect: Affect is not labile, blunt, angry or inappropriate.     ?   Speech: Speech normal.     ?   Behavior: Behavior normal.     ?   Thought Content: Thought content normal. Thought content is not paranoid or delusional. Thought content does not include homicidal or suicidal ideation. Thought content does not include homicidal or suicidal plan.     ?   Cognition and Memory: Cognition and memory normal.     ?   Judgment: Judgment normal.  ?   Comments: Insight  intact ?Mood presents as less depressed. Mood is anxious when discussing events for son's wedding  ? ? ?Lab Review:  ?No results found for: NA, K, CL, CO2, GLUCOSE, BUN, CREATININE, CALCIUM, PROT, ALBUMIN, A

## 2022-01-05 ENCOUNTER — Ambulatory Visit: Payer: Medicare Other | Admitting: Psychology

## 2022-01-19 ENCOUNTER — Ambulatory Visit (INDEPENDENT_AMBULATORY_CARE_PROVIDER_SITE_OTHER): Payer: Medicare Other | Admitting: Psychology

## 2022-01-19 DIAGNOSIS — F411 Generalized anxiety disorder: Secondary | ICD-10-CM

## 2022-01-19 DIAGNOSIS — F4323 Adjustment disorder with mixed anxiety and depressed mood: Secondary | ICD-10-CM | POA: Diagnosis not present

## 2022-01-19 DIAGNOSIS — F331 Major depressive disorder, recurrent, moderate: Secondary | ICD-10-CM | POA: Diagnosis not present

## 2022-01-19 NOTE — Progress Notes (Signed)
[place] [facility] Counselor/Therapist Progress Note ? ?Patient ID: Kristin Sparks, MRN: [id number],   ? ?Date: 01/19/2022 ? ?Time Spent: 60 minutes ? ?Treatment Type: Individual Therapy ? ?Reported Symptoms: anxiety and depression ? ?Mental Status Exam: ?Appearance:  Casual     ?Behavior: Appropriate  ?Motor: Normal  ?Speech/Language:  Normal Rate  ?Affect: Blunt  ?Mood: anxious  ?Thought process: normal  ?Thought content:   WNL  ?Sensory/Perceptual disturbances:   WNL  ?Orientation: oriented to person, place, time/date, and situation  ?Attention: Good  ?Concentration: Good  ?Memory: WNL  ?Fund of knowledge:  Good  ?Insight:   Good  ?Judgment:  Good  ?Impulse Control: Good  ? ?Risk Assessment: ?Danger to Self:  No ?Self-injurious Behavior: No ?Danger to Others: No ?Duty to Warn:no ?Physical Aggression / Violence:No  ?Access to Firearms a concern: No  ?Gang Involvement:No  ? ?Subjective: The patient attended a face-to-face individual therapy session via video visit.  The patient gave verbal consent to have the session on video on WebEx.  The patient was in her home alone and the therapist was in the office.  The patient presents with a blunted affect and mood is anxious.  The patient reports that she is anxious over the wedding in May for her son and future daughter-in-law.  We talked about her taking 1 step at a time and knowing that she is going to be fine during the process.  The patient reports that her brother noticed while they were on vacation that she is very negative much of the time.  We talked about her changing her thoughts from negative to positive and that is what she is planning to work on between now and our next session.  The patient seems to be managing okay but is having some anxiety related to the wedding and I recommended that she still try to use some of the medicine that her medication provider provided her with just to know what it is going to do and how it is going to affect  her. ? ?Interventions: Cognitive Behavioral Therapy and Assertiveness/Communication ? ?Diagnosis:Major depressive disorder, recurrent episode, moderate ([place]) ? ?Generalized anxiety disorder ? ?Adjustment disorder with mixed anxiety and depressed mood ? ?Plan: ? ?Client Abilities/Strengths  ?Intelligent, caring, motivated  ?Client Treatment Preferences  ?Outpatient individual therapy  ?Client Statement of Needs  ?" I need some help to deal with my situation", "I 'm not sure if I'm depressed or what?"  ?Treatment Level  ?Outpatient Individual therapy  ?Symptoms  ?Frustration and anxiety related to providing oversight and caretaking to an aging, ailing, and dependent  ?parent.: No Description Entered (Status: improved). Lack of energy.: No Description Entered (Status:  ?improved). Motor tension (e.g., restlessness, tiredness, shakiness, muscle tension).: No Description  ?Entered (Status: improved). Poor concentration and indecisiveness.: No Description Entered (Status:  ?improved). Social withdrawal.: No Description Entered (Status: improved).  ?Problems Addressed  ?Unipolar Depression, Phase Of Life Problems, Unipolar Depression, Anxiety, Unipolar Depression,  ?Phase Of Life Problems, Anxiety  ?Goals ?1. Appropriately grieve the loss in order to normalize mood and to return  ?to previously adaptive level of functioning. ?2. Balance life activities between consideration of others and development ?of own interests. ?Objective ?Apply problem-solving skills to current circumstances ?Target Date: 2022-10-15 Frequency: Biweekly ?Progress: 90 Modality: individual ?Related Interventions ?1. Use modeling and role-playing with the client to apply the problem-solving approach to his/her  ?current circumstances (or assign "Applying Problem-Solving to Interpersonal Conflict" from  ?the Adult Psychotherapy Homework Planner by Bryn Gulling);  encourage implementation of action  ?plan, reinforcing success and redirecting for  failure. ?Objective ?Implement new activities that increase a sense of satisfaction. ?Target Date: 2022-10-15 Frequency: Biweekly ?Progress: 90 Modality: individual ?Related Interventions ?1. Develop a plan with the client to include activities that will increase his/her satisfaction, fulfill  ?his/her values, and improve the quality of his/her life. ?3. Develop healthy interpersonal relationships that lead to the alleviation  ?and help prevent the relapse of depression. ?Objective ?Learn and implement problem-solving and decision-making skills. ?Target Date: 2022-10-15 Frequency: Biweekly ?Progress: 90 Modality: individual ?Related Interventions ?1. Encourage in the client the development of a positive problem orientation in which problems  ?and solving them are viewed as a natural part of life and not something to be feared, despaired,  ?or avoided. ?2. Conduct Problem-Solving Therapy  ?Implement new activities that increase a sense of satisfaction. ?Target Date: 2022-10-15 Frequency: Biweekly ?Progress: 90 Modality: individual ?Related Interventions ?1. Develop a plan with the client to include activities that will increase his/her satisfaction, fulfill  ?his/her values, and improve the quality of his/her life. ?3. Develop healthy interpersonal relationships that lead to the alleviation  ?and help prevent the relapse of depression. ?Objective ?Learn and implement problem-solving and decision-making skills. ?Target Date: 2022-10-15 Frequency: Biweekly ?Progress: 90 Modality: individual ?Related Interventions ?1. Encourage in the client the development of a positive problem orientation in which problems  ?and solving them are viewed as a natural part of life and not something to be feared, despaired,  ?or avoided. ? ?Objective ?Identify and replace thoughts and beliefs that support depression. ?Target Date: 2022-10-15 Frequency: Biweekly ?Progress: 90 Modality: individual ?Related Interventions ?1. Explore and  restructure underlying assumptions and beliefs reflected in biased self-talk that  ?may put the client at risk for relapse or recurrence. ?2. Facilitate and reinforce the client's shift from biased depressive self-talk and beliefs to realitybased cognitive messages that enhance self-confidence and increase adaptive actions (see  ?"Positive Self-Talk" in the Adult Psychotherapy Homework Planner by [facility]). ?4. Enhance ability to effectively cope with the full variety of life's worries  ?and anxieties. ?Objective ?Identify, challenge, and replace biased, fearful self-talk with positive, realistic, and empowering selftalk. ?Target Date: 2022-10-15 Frequency: Biweekly ?Progress: 80 Modality: individual ?Related Interventions ?1. Explore the client's schema and self-talk that mediate his/her fear response; assist him/her in  ?challenging the biases; replace the distorted messages with reality-based alternatives and  ?positive, realistic self-talk that will increase his/her self-confidence in coping with irrational  ?fears (see Cognitive Therapy of Anxiety Disorders by Alison Stalling). ?Objective ?Learn and implement problem-solving strategies for realistically addressing worries. ?Target Date: 2022-10-15 Frequency: Biweekly ?Progress: 60 Modality: individual Therapy ?5. Recognize, accept, and cope with feelings of depression. ?6. Resolve conflicted feelings and adapt to the new life circumstances. ?7. Stabilize anxiety level while increasing ability to function on a daily ? ?The patient is making progress and has approved this plan of treatment. ? ?Damilola Flamm G Dreya Buhrman, LCSW ? ? ? ? ? ? ? ? ? ? ? ? ? ? ? ? ? ?Jeannine Pennisi G Shamari Lofquist, LCSW ? ? ? ? ? ? ? ? ? ? ? ? ? ? ?Kayelynn Abdou G Annikah Lovins, LCSW ? ? ? ? ? ? ? ? ? ? ? ? ? ? ?Gaither Biehn G Xavia Kniskern, LCSW ? ? ? ? ? ? ? ? ? ? ? ? ? ? ?Nola Botkins G Raylyn Speckman, LCSW ? ? ? ? ? ? ? ? ? ? ? ? ? ? ?Hiedi Touchton G Tayvian [place]cross, LCSW ? ? ? ? ? ? ? ? ? ? ? ? ? ? ?  Matheus Spiker G Byren Pankow, LCSW ?

## 2022-02-02 ENCOUNTER — Ambulatory Visit (INDEPENDENT_AMBULATORY_CARE_PROVIDER_SITE_OTHER): Payer: Medicare Other | Admitting: Psychology

## 2022-02-02 DIAGNOSIS — F411 Generalized anxiety disorder: Secondary | ICD-10-CM | POA: Diagnosis not present

## 2022-02-02 DIAGNOSIS — F4323 Adjustment disorder with mixed anxiety and depressed mood: Secondary | ICD-10-CM

## 2022-02-02 DIAGNOSIS — F331 Major depressive disorder, recurrent, moderate: Secondary | ICD-10-CM

## 2022-02-03 NOTE — Progress Notes (Signed)
[place] Counselor/Therapist Progress Note ? ?Patient ID: Kristin Sparks, MRN: [id number],   ? ?Date: 02/02/2022 ? ?Time Spent: 60 minutes ? ?Treatment Type: Individual Therapy ? ?Reported Symptoms: anxiety and depression ? ?Mental Status Exam: ?Appearance:  Casual     ?Behavior: Appropriate  ?Motor: Normal  ?Speech/Language:  Normal Rate  ?Affect: Blunt  ?Mood: anxious  ?Thought process: normal  ?Thought content:   WNL  ?Sensory/Perceptual disturbances:   WNL  ?Orientation: oriented to person, place, time/date, and situation  ?Attention: Good  ?Concentration: Good  ?Memory: WNL  ?Fund of knowledge:  Good  ?Insight:   Good  ?Judgment:  Good  ?Impulse Control: Good  ? ?Risk Assessment: ?Danger to Self:  No ?Self-injurious Behavior: No ?Danger to Others: No ?Duty to Warn:no ?Physical Aggression / Violence:No  ?Access to Firearms a concern: No  ?Gang Involvement:No  ? ?Subjective: The patient attended a face-to-face individual therapy session via video visit.  The patient gave verbal consent to have the session on video on WebEx.  The patient was in her home alone and the therapist was in the office.  The patient presents with a blunted affect and mood is anxious.  The patient reports that the wedding is coming up on May 20 and she feels like she will be relieved when this is over.  The patient talked about her anxiety and anxiety about not knowing where she is going to land.  We talked about continuing to stay in the present moment and to do what she needs to do for right now and not to worry about what she needs to do in the future yet.  The patient continues to be anxious and needs validation and reassurance on a regular basis.  We will continue to provide therapy and strategic tools to help her deal with her anxiety. ? ?Interventions: Cognitive Behavioral Therapy and Assertiveness/Communication ? ?Diagnosis:Major depressive disorder, recurrent episode, moderate ([place]) ? ?Generalized anxiety  disorder ? ?Adjustment disorder with mixed anxiety and depressed mood ? ?Plan: ? ?Client Abilities/Strengths  ?Intelligent, caring, motivated  ?Client Treatment Preferences  ?Outpatient individual therapy  ?Client Statement of Needs  ?" I need some help to deal with my situation", "I 'm not sure if I'm depressed or what?"  ?Treatment Level  ?Outpatient Individual therapy  ?Symptoms  ?Frustration and anxiety related to providing oversight and caretaking to an aging, ailing, and dependent  ?parent.: No Description Entered (Status: improved). Lack of energy.: No Description Entered (Status:  ?improved). Motor tension (e.g., restlessness, tiredness, shakiness, muscle tension).: No Description  ?Entered (Status: improved). Poor concentration and indecisiveness.: No Description Entered (Status:  ?improved). Social withdrawal.: No Description Entered (Status: improved).  ?Problems Addressed  ?Unipolar Depression, Phase Of Life Problems, Unipolar Depression, Anxiety, Unipolar Depression,  ?Phase Of Life Problems, Anxiety  ?Goals ?1. Appropriately grieve the loss in order to normalize mood and to return  ?to previously adaptive level of functioning. ?2. Balance life activities between consideration of others and development ?of own interests. ?Objective ?Apply problem-solving skills to current circumstances ?Target Date: 2022-10-15 Frequency: Biweekly ?Progress: 90 Modality: individual ?Related Interventions ?1. Use modeling and role-playing with the client to apply the problem-solving approach to his/her  ?current circumstances (or assign "Applying Problem-Solving to Interpersonal Conflict" from  ?the Adult Psychotherapy Homework Planner by Bryn Gulling); encourage implementation of action  ?plan, reinforcing success and redirecting for failure. ?Objective ?Implement new activities that increase a sense of satisfaction. ?Target Date: 2022-10-15 Frequency: Biweekly ?Progress: 90 Modality: individual ?Related Interventions ?1.  Develop a plan with the client to include activities that will increase his/her satisfaction, fulfill  ?his/her values, and improve the quality of his/her life. ?3. Develop healthy interpersonal relationships that lead to the alleviation  ?and help prevent the relapse of depression. ?Objective ?Learn and implement problem-solving and decision-making skills. ?Target Date: 2022-10-15 Frequency: Biweekly ?Progress: 90 Modality: individual ?Related Interventions ?1. Encourage in the client the development of a positive problem orientation in which problems  ?and solving them are viewed as a natural part of life and not something to be feared, despaired,  ?or avoided. ?2. Conduct Problem-Solving Therapy  ?Implement new activities that increase a sense of satisfaction. ?Target Date: 2022-10-15 Frequency: Biweekly ?Progress: 90 Modality: individual ?Related Interventions ?1. Develop a plan with the client to include activities that will increase his/her satisfaction, fulfill  ?his/her values, and improve the quality of his/her life. ?3. Develop healthy interpersonal relationships that lead to the alleviation  ?and help prevent the relapse of depression. ?Objective ?Learn and implement problem-solving and decision-making skills. ?Target Date: 2022-10-15 Frequency: Biweekly ?Progress: 90 Modality: individual ?Related Interventions ?1. Encourage in the client the development of a positive problem orientation in which problems  ?and solving them are viewed as a natural part of life and not something to be feared, despaired,  ?or avoided. ? ?Objective ?Identify and replace thoughts and beliefs that support depression. ?Target Date: 2022-10-15 Frequency: Biweekly ?Progress: 90 Modality: individual ?Related Interventions ?1. Explore and restructure underlying assumptions and beliefs reflected in biased self-talk that  ?may put the client at risk for relapse or recurrence. ?2. Facilitate and reinforce the client's shift from  biased depressive self-talk and beliefs to realitybased cognitive messages that enhance self-confidence and increase adaptive actions (see  ?"Positive Self-Talk" in the Adult Psychotherapy Homework Planner by [facility]). ?4. Enhance ability to effectively cope with the full variety of life's worries  ?and anxieties. ?Objective ?Identify, challenge, and replace biased, fearful self-talk with positive, realistic, and empowering selftalk. ?Target Date: 2022-10-15 Frequency: Biweekly ?Progress: 80 Modality: individual ?Related Interventions ?1. Explore the client's schema and self-talk that mediate his/her fear response; assist him/her in  ?challenging the biases; replace the distorted messages with reality-based alternatives and  ?positive, realistic self-talk that will increase his/her self-confidence in coping with irrational  ?fears (see Cognitive Therapy of Anxiety Disorders by Alison Stalling). ?Objective ?Learn and implement problem-solving strategies for realistically addressing worries. ?Target Date: 2022-10-15 Frequency: Biweekly ?Progress: 60 Modality: individual Therapy ?5. Recognize, accept, and cope with feelings of depression. ?6. Resolve conflicted feelings and adapt to the new life circumstances. ?7. Stabilize anxiety level while increasing ability to function on a daily ? ?The patient is making progress and has approved this plan of treatment. ? ?Vincente Asbridge G Kirk Sampley, LCSW ? ? ? ? ? ? ? ? ? ? ? ? ? ? ? ? ? ?Jule Whitsel G Thom Ollinger, LCSW ? ? ? ? ? ? ? ? ? ? ? ? ? ? ?Cuca Benassi G Yariela Tison, LCSW ? ? ? ? ? ? ? ? ? ? ? ? ? ? ?[place] Fant G Miral Hoopes, LCSW ? ? ? ? ? ? ? ? ? ? ? ? ? ? ?Jaira Canady G Zeric Baranowski, LCSW ? ? ? ? ? ? ? ? ? ? ? ? ? ? ?Pattijo Juste G Sigourney Portillo, LCSW ? ? ? ? ? ? ? ? ? ? ? ? ? ? ?Sho Salguero G Mariadelaluz [place], LCSW ? ? ? ? ? ? ? ? ? ? ? ? ? ? ?Antonietta Lansdowne G Sharyah Bostwick, LCSW ?

## 2022-02-16 ENCOUNTER — Ambulatory Visit (INDEPENDENT_AMBULATORY_CARE_PROVIDER_SITE_OTHER): Payer: Medicare Other | Admitting: Psychology

## 2022-02-16 DIAGNOSIS — F4323 Adjustment disorder with mixed anxiety and depressed mood: Secondary | ICD-10-CM | POA: Diagnosis not present

## 2022-02-16 DIAGNOSIS — F411 Generalized anxiety disorder: Secondary | ICD-10-CM | POA: Diagnosis not present

## 2022-02-16 DIAGNOSIS — F331 Major depressive disorder, recurrent, moderate: Secondary | ICD-10-CM | POA: Diagnosis not present

## 2022-02-16 NOTE — Progress Notes (Signed)
[facility] Counselor/Therapist Progress Note  Patient ID: Kristin Sparks, MRN: [id number],    Date: 02/16/2022  Time Spent: 60 minutes  Treatment Type: Individual Therapy  Reported Symptoms: anxiety and depression  Mental Status Exam: Appearance:  Casual     Behavior: Appropriate  Motor: Normal  Speech/Language:  Normal Rate  Affect: Blunt  Mood: anxious  Thought process: normal  Thought content:   WNL  Sensory/Perceptual disturbances:   WNL  Orientation: oriented to person, place, time/date, and situation  Attention: Good  Concentration: Good  Memory: WNL  Fund of knowledge:  Good  Insight:   Good  Judgment:  Good  Impulse Control: Good   Risk Assessment: Danger to Self:  No Self-injurious Behavior: No Danger to Others: No Duty to Warn:no Physical Aggression / Violence:No  Access to Firearms a concern: No  Gang Involvement:No   Subjective: The patient attended a face-to-face individual therapy session via video visit.  The patient gave verbal consent to have the session on video on WebEx.  The patient was in her home alone and the therapist was in the office.  The patient was smiling and pleasant today.  The patient reports that her son's wedding went off without any problems.  The patient talked about how they did things and was pleased that everything turned out so well.  We talked about her getting focused now again on what it is that she wants to do and if there are some trips that she might be interested in taking.  I recommended that she write these things down so that she could put them on a bucket list.  Encouraged the patient to begin thinking about this again and gave her positive feedback for handling the situation with her sons rehearsal dinner and wedding.  Interventions: Cognitive Behavioral Therapy and Assertiveness/Communication  Diagnosis:Major depressive disorder, recurrent episode, moderate (HCC)  Generalized anxiety  disorder  Adjustment disorder with mixed anxiety and depressed mood  Plan:  Client Abilities/Strengths  Intelligent, caring, motivated  Client Treatment Preferences  Outpatient individual therapy  Client Statement of Needs  " I need some help to deal with my situation", "I 'm not sure if I'm depressed or what?"  Treatment Level  Outpatient Individual therapy  Symptoms  Frustration and anxiety related to providing oversight and caretaking to an aging, ailing, and dependent  parent.: No Description Entered (Status: improved). Lack of energy.: No Description Entered (Status:  improved). Motor tension (e.g., restlessness, tiredness, shakiness, muscle tension).: No Description  Entered (Status: improved). Poor concentration and indecisiveness.: No Description Entered (Status:  improved). Social withdrawal.: No Description Entered (Status: improved).  Problems Addressed  Unipolar Depression, Phase Of Life Problems, Unipolar Depression, Anxiety, Unipolar Depression,  Phase Of Life Problems, Anxiety  Goals 1. Appropriately grieve the loss in order to normalize mood and to return  to previously adaptive level of functioning. 2. Balance life activities between consideration of others and development of own interests. Objective Apply problem-solving skills to current circumstances Target Date: 2022-10-15 Frequency: Biweekly Progress: 90 Modality: individual Related Interventions 1. Use modeling and role-playing with the client to apply the problem-solving approach to his/her  current circumstances (or assign "Applying Problem-Solving to Interpersonal Conflict" from  the Adult Psychotherapy Homework Planner by [facility]); encourage implementation of action  plan, reinforcing success and redirecting for failure. Objective Implement new activities that increase a sense of satisfaction. Target Date: 2022-10-15 Frequency: Biweekly Progress: 90 Modality: individual Related Interventions 1.  Develop a plan with the client to include  activities that will increase his/her satisfaction, fulfill  his/her values, and improve the quality of his/her life. 3. Develop healthy interpersonal relationships that lead to the alleviation  and help prevent the relapse of depression. Objective Learn and implement problem-solving and decision-making skills. Target Date: 2022-10-15 Frequency: Biweekly Progress: 90 Modality: individual Related Interventions 1. Encourage in the client the development of a positive problem orientation in which problems  and solving them are viewed as a natural part of life and not something to be feared, despaired,  or avoided. 2. Conduct Problem-Solving Therapy  Implement new activities that increase a sense of satisfaction. Target Date: 2022-10-15 Frequency: Biweekly Progress: 90 Modality: individual Related Interventions 1. Develop a plan with the client to include activities that will increase his/her satisfaction, fulfill  his/her values, and improve the quality of his/her life. 3. Develop healthy interpersonal relationships that lead to the alleviation  and help prevent the relapse of depression. Objective Learn and implement problem-solving and decision-making skills. Target Date: 2022-10-15 Frequency: Biweekly Progress: 90 Modality: individual Related Interventions 1. Encourage in the client the development of a positive problem orientation in which problems  and solving them are viewed as a natural part of life and not something to be feared, despaired,  or avoided.  Objective Identify and replace thoughts and beliefs that support depression. Target Date: 2022-10-15 Frequency: Biweekly Progress: 90 Modality: individual Related Interventions 1. Explore and restructure underlying assumptions and beliefs reflected in biased self-talk that  may put the client at risk for relapse or recurrence. 2. Facilitate and reinforce the client's shift from  biased depressive self-talk and beliefs to realitybased cognitive messages that enhance self-confidence and increase adaptive actions (see  "Positive Self-Talk" in the Adult Psychotherapy Homework Planner by Bryn Gulling). 4. Enhance ability to effectively cope with the full variety of life's worries  and anxieties. Objective Identify, challenge, and replace biased, fearful self-talk with positive, realistic, and empowering selftalk. Target Date: 2022-10-15 Frequency: Biweekly Progress: 80 Modality: individual Related Interventions 1. Explore the client's schema and self-talk that mediate his/her fear response; assist him/her in  challenging the biases; replace the distorted messages with reality-based alternatives and  positive, realistic self-talk that will increase his/her self-confidence in coping with irrational  fears (see Cognitive Therapy of Anxiety Disorders by Alison Stalling). Objective Learn and implement problem-solving strategies for realistically addressing worries. Target Date: 2022-10-15 Frequency: Biweekly Progress: 60 Modality: individual Therapy 5. Recognize, accept, and cope with feelings of depression. 6. Resolve conflicted feelings and adapt to the new life circumstances. 7. Stabilize anxiety level while increasing ability to function on a daily  The patient is making progress and has approved this plan of treatment.  Glenn Christo G Jenese Mischke, LCSW                  Kenzley Ke G Aliena Ghrist, LCSW               Lyna Laningham G Norval Slaven, LCSW               Brandelyn Henne G Eleanora Guinyard, LCSW               Godfrey Tritschler G Kerrick Miler, LCSW               Shernell Saldierna G Derion Kreiter, LCSW               Anita Laguna G Sakara Lehtinen, LCSW               Eylin Pontarelli G Cameryn Chrisley, LCSW  Blessing Zaucha G Jadarian Mckay, LCSW

## 2022-03-02 ENCOUNTER — Ambulatory Visit (INDEPENDENT_AMBULATORY_CARE_PROVIDER_SITE_OTHER): Payer: Medicare Other | Admitting: Psychology

## 2022-03-02 DIAGNOSIS — F4323 Adjustment disorder with mixed anxiety and depressed mood: Secondary | ICD-10-CM | POA: Diagnosis not present

## 2022-03-02 DIAGNOSIS — F411 Generalized anxiety disorder: Secondary | ICD-10-CM

## 2022-03-02 DIAGNOSIS — F331 Major depressive disorder, recurrent, moderate: Secondary | ICD-10-CM | POA: Diagnosis not present

## 2022-03-02 NOTE — Progress Notes (Signed)
[place] Counselor/Therapist Progress Note  Patient ID: Kristin Sparks, MRN: [id number],    Date: 03/02/2022  Time Spent: 60 minutes  Treatment Type: Individual Therapy  Reported Symptoms: anxiety and depression  Mental Status Exam: Appearance:  Casual     Behavior: Appropriate  Motor: Normal  Speech/Language:  Normal Rate  Affect: Blunt  Mood: anxious  Thought process: normal  Thought content:   WNL  Sensory/Perceptual disturbances:   WNL  Orientation: oriented to person, place, time/date, and situation  Attention: Good  Concentration: Good  Memory: WNL  Fund of knowledge:  Good  Insight:   Good  Judgment:  Good  Impulse Control: Good   Risk Assessment: Danger to Self:  No Self-injurious Behavior: No Danger to Others: No Duty to Warn:no Physical Aggression / Violence:No  Access to Firearms a concern: No  Gang Involvement:No   Subjective: The patient attended a face-to-face individual therapy session via video visit.  The patient gave verbal consent to have the session on video on WebEx.  The patient was in her home alone and the therapist was in the office.  The patient reports that she is down at her sister's house and has been there about a week.  The patient reports that her brother was diagnosed recently with bipolar disorder and I educated her a little more on what that entails.  The patient talked about still not knowing what she wants to do as far as morning to move and I told her that at this point she does not have to choose to do anything.  The patient reports that she is not as stressed as she was before the wedding.  The patient continues to have anxiety however she appears to be managing it.  Encouraged her to just continue to try to enjoy herself and not worry about making any big decisions right now as I think she feels more comfortable staying where she is.  Interventions: Cognitive Behavioral Therapy and  Assertiveness/Communication  Diagnosis:Major depressive disorder, recurrent episode, moderate (HCC)  Generalized anxiety disorder  Adjustment disorder with mixed anxiety and depressed mood  Plan:  Client Abilities/Strengths  Intelligent, caring, motivated  Client Treatment Preferences  Outpatient individual therapy  Client Statement of Needs  " I need some help to deal with my situation", "I 'm not sure if I'm depressed or what?"  Treatment Level  Outpatient Individual therapy  Symptoms  Frustration and anxiety related to providing oversight and caretaking to an aging, ailing, and dependent  parent.: No Description Entered (Status: improved). Lack of energy.: No Description Entered (Status:  improved). Motor tension (e.g., restlessness, tiredness, shakiness, muscle tension).: No Description  Entered (Status: improved). Poor concentration and indecisiveness.: No Description Entered (Status:  improved). Social withdrawal.: No Description Entered (Status: improved).  Problems Addressed  Unipolar Depression, Phase Of Life Problems, Unipolar Depression, Anxiety, Unipolar Depression,  Phase Of Life Problems, Anxiety  Goals 1. Appropriately grieve the loss in order to normalize mood and to return  to previously adaptive level of functioning. 2. Balance life activities between consideration of others and development of own interests. Objective Apply problem-solving skills to current circumstances Target Date: 2022-10-15 Frequency: Biweekly Progress: 90 Modality: individual Related Interventions 1. Use modeling and role-playing with the client to apply the problem-solving approach to his/her  current circumstances (or assign "Applying Problem-Solving to Interpersonal Conflict" from  the Adult Psychotherapy Homework Planner by [facility]); encourage implementation of action  plan, reinforcing success and redirecting for failure. Objective Implement new activities that  increase a sense of  satisfaction. Target Date: 2022-10-15 Frequency: Biweekly Progress: 90 Modality: individual Related Interventions 1. Develop a plan with the client to include activities that will increase his/her satisfaction, fulfill  his/her values, and improve the quality of his/her life. 3. Develop healthy interpersonal relationships that lead to the alleviation  and help prevent the relapse of depression. Objective Learn and implement problem-solving and decision-making skills. Target Date: 2022-10-15 Frequency: Biweekly Progress: 90 Modality: individual Related Interventions 1. Encourage in the client the development of a positive problem orientation in which problems  and solving them are viewed as a natural part of life and not something to be feared, despaired,  or avoided. 2. Conduct Problem-Solving Therapy  Implement new activities that increase a sense of satisfaction. Target Date: 2022-10-15 Frequency: Biweekly Progress: 90 Modality: individual Related Interventions 1. Develop a plan with the client to include activities that will increase his/her satisfaction, fulfill  his/her values, and improve the quality of his/her life. 3. Develop healthy interpersonal relationships that lead to the alleviation  and help prevent the relapse of depression. Objective Learn and implement problem-solving and decision-making skills. Target Date: 2022-10-15 Frequency: Biweekly Progress: 90 Modality: individual Related Interventions 1. Encourage in the client the development of a positive problem orientation in which problems  and solving them are viewed as a natural part of life and not something to be feared, despaired,  or avoided.  Objective Identify and replace thoughts and beliefs that support depression. Target Date: 2022-10-15 Frequency: Biweekly Progress: 90 Modality: individual Related Interventions 1. Explore and restructure underlying assumptions and beliefs reflected in biased  self-talk that  may put the client at risk for relapse or recurrence. 2. Facilitate and reinforce the client's shift from biased depressive self-talk and beliefs to realitybased cognitive messages that enhance self-confidence and increase adaptive actions (see  "Positive Self-Talk" in the Adult Psychotherapy Homework Planner by Bryn Gulling). 4. Enhance ability to effectively cope with the full variety of life's worries  and anxieties. Objective Identify, challenge, and replace biased, fearful self-talk with positive, realistic, and empowering selftalk. Target Date: 2022-10-15 Frequency: Biweekly Progress: 80 Modality: individual Related Interventions 1. Explore the client's schema and self-talk that mediate his/her fear response; assist him/her in  challenging the biases; replace the distorted messages with reality-based alternatives and  positive, realistic self-talk that will increase his/her self-confidence in coping with irrational  fears (see Cognitive Therapy of Anxiety Disorders by Alison Stalling). Objective Learn and implement problem-solving strategies for realistically addressing worries. Target Date: 2022-10-15 Frequency: Biweekly Progress: 60 Modality: individual Therapy 5. Recognize, accept, and cope with feelings of depression. 6. Resolve conflicted feelings and adapt to the new life circumstances. 7. Stabilize anxiety level while increasing ability to function on a daily  The patient is making progress and has approved this plan of treatment.  Azul Brumett G Hildegarde Dunaway, LCSW                  Caprice Wasko G Young Mulvey, LCSW               Joshlynn Alfonzo G Dylann Layne, LCSW               Jeven Topper G Derrick Orris, LCSW               Hayli Milligan G Jadden Yim, LCSW               Licia [name] G Aston Lieske, LCSW               Damian Buckles G Vianca Bracher, LCSW  Jacia Sickman G Robbie Rideaux, LCSW               Orson Rho G Roshan Salamon,  LCSW               Briyanna Billingham G Kelsea Mousel, LCSW

## 2022-03-16 ENCOUNTER — Ambulatory Visit (INDEPENDENT_AMBULATORY_CARE_PROVIDER_SITE_OTHER): Payer: Medicare Other | Admitting: Psychology

## 2022-03-16 DIAGNOSIS — F4323 Adjustment disorder with mixed anxiety and depressed mood: Secondary | ICD-10-CM

## 2022-03-16 DIAGNOSIS — F331 Major depressive disorder, recurrent, moderate: Secondary | ICD-10-CM

## 2022-03-16 DIAGNOSIS — F411 Generalized anxiety disorder: Secondary | ICD-10-CM | POA: Diagnosis not present

## 2022-03-17 NOTE — Progress Notes (Signed)
[place] Counselor/Therapist Progress Note  Patient ID: Kristin Sparks, MRN: [id number],    Date: 03/17/2022  Time Spent: 60 minutes  Treatment Type: Individual Therapy  Reported Symptoms: anxiety and depression  Mental Status Exam: Appearance:  Casual     Behavior: Appropriate  Motor: Normal  Speech/Language:  Normal Rate  Affect: Blunt  Mood: anxious  Thought process: normal  Thought content:   WNL  Sensory/Perceptual disturbances:   WNL  Orientation: oriented to person, place, time/date, and situation  Attention: Good  Concentration: Good  Memory: WNL  Fund of knowledge:  Good  Insight:   Good  Judgment:  Good  Impulse Control: Good   Risk Assessment: Danger to Self:  No Self-injurious Behavior: No Danger to Others: No Duty to Warn:no Physical Aggression / Violence:No  Access to Firearms a concern: No  Gang Involvement:No   Subjective: The patient attended a face-to-face individual therapy session via video visit.  The patient gave verbal consent to have the session on video on WebEx.  The patient was in her home alone and the therapist was in the office.  The patient reports that she is down at her  house and has been there about a week.  The patient reports that she recently had some brush removed from the back of her house and feels much better about her house.  She states that she has decided that she will fix it up and live there at least another year until she decides what to do but she does know that she wants to move at some point.  She is going to begin looking at places in [place] and also in [place] area.  The patient talked about feeling like she does want to be somewhere where her family is so that she can get the support that she needs.  The patient is now making some movement on these issues.  Interventions: Cognitive Behavioral Therapy and Assertiveness/Communication  Diagnosis:Major depressive disorder, recurrent  episode, moderate (HCC)  Generalized anxiety disorder  Adjustment disorder with mixed anxiety and depressed mood  Plan:  Client Abilities/Strengths  Intelligent, caring, motivated  Client Treatment Preferences  Outpatient individual therapy  Client Statement of Needs  " I need some help to deal with my situation", "I 'm not sure if I'm depressed or what?"  Treatment Level  Outpatient Individual therapy  Symptoms  Frustration and anxiety related to providing oversight and caretaking to an aging, ailing, and dependent  parent.: No Description Entered (Status: improved). Lack of energy.: No Description Entered (Status:  improved). Motor tension (e.g., restlessness, tiredness, shakiness, muscle tension).: No Description  Entered (Status: improved). Poor concentration and indecisiveness.: No Description Entered (Status:  improved). Social withdrawal.: No Description Entered (Status: improved).  Problems Addressed  Unipolar Depression, Phase Of Life Problems, Unipolar Depression, Anxiety, Unipolar Depression,  Phase Of Life Problems, Anxiety  Goals 1. Appropriately grieve the loss in order to normalize mood and to return  to previously adaptive level of functioning. 2. Balance life activities between consideration of others and development of own interests. Objective Apply problem-solving skills to current circumstances Target Date: 2022-10-15 Frequency: Biweekly Progress: 90 Modality: individual Related Interventions 1. Use modeling and role-playing with the client to apply the problem-solving approach to his/her  current circumstances (or assign "Applying Problem-Solving to Interpersonal Conflict" from  the Adult Psychotherapy Homework Planner by [facility]); encourage implementation of action  plan, reinforcing success and redirecting for failure. Objective Implement new activities that increase a sense of satisfaction.  Target Date: 2022-10-15 Frequency: Biweekly Progress: 90  Modality: individual Related Interventions 1. Develop a plan with the client to include activities that will increase his/her satisfaction, fulfill  his/her values, and improve the quality of his/her life. 3. Develop healthy interpersonal relationships that lead to the alleviation  and help prevent the relapse of depression. Objective Learn and implement problem-solving and decision-making skills. Target Date: 2022-10-15 Frequency: Biweekly Progress: 90 Modality: individual Related Interventions 1. Encourage in the client the development of a positive problem orientation in which problems  and solving them are viewed as a natural part of life and not something to be feared, despaired,  or avoided. 2. Conduct Problem-Solving Therapy  Implement new activities that increase a sense of satisfaction. Target Date: 2022-10-15 Frequency: Biweekly Progress: 90 Modality: individual Related Interventions 1. Develop a plan with the client to include activities that will increase his/her satisfaction, fulfill  his/her values, and improve the quality of his/her life. 3. Develop healthy interpersonal relationships that lead to the alleviation  and help prevent the relapse of depression. Objective Learn and implement problem-solving and decision-making skills. Target Date: 2022-10-15 Frequency: Biweekly Progress: 90 Modality: individual Related Interventions 1. Encourage in the client the development of a positive problem orientation in which problems  and solving them are viewed as a natural part of life and not something to be feared, despaired,  or avoided.  Objective Identify and replace thoughts and beliefs that support depression. Target Date: 2022-10-15 Frequency: Biweekly Progress: 90 Modality: individual Related Interventions 1. Explore and restructure underlying assumptions and beliefs reflected in biased self-talk that  may put the client at risk for relapse or recurrence. 2.  Facilitate and reinforce the client's shift from biased depressive self-talk and beliefs to realitybased cognitive messages that enhance self-confidence and increase adaptive actions (see  "Positive Self-Talk" in the Adult Psychotherapy Homework Planner by Bryn Gulling). 4. Enhance ability to effectively cope with the full variety of life's worries  and anxieties. Objective Identify, challenge, and replace biased, fearful self-talk with positive, realistic, and empowering selftalk. Target Date: 2022-10-15 Frequency: Biweekly Progress: 80 Modality: individual Related Interventions 1. Explore the client's schema and self-talk that mediate his/her fear response; assist him/her in  challenging the biases; replace the distorted messages with reality-based alternatives and  positive, realistic self-talk that will increase his/her self-confidence in coping with irrational  fears (see Cognitive Therapy of Anxiety Disorders by Alison Stalling). Objective Learn and implement problem-solving strategies for realistically addressing worries. Target Date: 2022-10-15 Frequency: Biweekly Progress: 60 Modality: individual Therapy 5. Recognize, accept, and cope with feelings of depression. 6. Resolve conflicted feelings and adapt to the new life circumstances. 7. Stabilize anxiety level while increasing ability to function on a daily  The patient is making progress and has approved this plan of treatment.  Merari Pion G Lashanta Elbe, LCSW                  Payton Moder G Bernyce Brimley, LCSW               Catha Ontko G Sigfredo Schreier, LCSW               Sanda Dejoy G Camron Essman, LCSW               [place] Monds G Glenda Kunst, LCSW               Sonda Coppens G Iaan Oregel, LCSW               Asaph Serena G Alnita Aybar, LCSW  Riham Polyakov G Kemani Demarais, LCSW               Cambell Stanek G Audia Amick, LCSW               Ceferino Lang G Kaidyn Hernandes, LCSW               Samie Reasons G  Najia Hurlbutt, LCSW

## 2022-03-21 ENCOUNTER — Other Ambulatory Visit: Payer: Self-pay | Admitting: Psychiatry

## 2022-03-21 DIAGNOSIS — F331 Major depressive disorder, recurrent, moderate: Secondary | ICD-10-CM

## 2022-03-24 ENCOUNTER — Telehealth: Payer: Medicare Other | Admitting: Psychiatry

## 2022-03-24 ENCOUNTER — Encounter: Payer: Self-pay | Admitting: Psychiatry

## 2022-03-24 ENCOUNTER — Telehealth (INDEPENDENT_AMBULATORY_CARE_PROVIDER_SITE_OTHER): Payer: Medicare Other | Admitting: Psychiatry

## 2022-03-24 DIAGNOSIS — G4700 Insomnia, unspecified: Secondary | ICD-10-CM

## 2022-03-24 DIAGNOSIS — F411 Generalized anxiety disorder: Secondary | ICD-10-CM

## 2022-03-24 DIAGNOSIS — F331 Major depressive disorder, recurrent, moderate: Secondary | ICD-10-CM | POA: Diagnosis not present

## 2022-03-24 MED ORDER — BUSPIRONE HCL 15 MG PO TABS: 15.0000 mg | ORAL_TABLET | Freq: Every morning | ORAL | 0 refills | Status: DC

## 2022-03-24 MED ORDER — BUPROPION HCL ER (SR) 100 MG PO TB12: 100.0000 mg | ORAL_TABLET | Freq: Every morning | ORAL | 0 refills | Status: DC

## 2022-03-24 MED ORDER — TRAZODONE HCL 50 MG PO TABS: ORAL_TABLET | ORAL | 2 refills | Status: AC

## 2022-03-24 MED ORDER — DULOXETINE HCL 60 MG PO CPEP: 60.0000 mg | ORAL_CAPSULE | Freq: Every morning | ORAL | 1 refills | Status: DC

## 2022-03-24 NOTE — Progress Notes (Signed)
Kristin Sparks [id number] 08-02-1956 66 y.o.  Virtual Visit via Video Note  I connected with pt @ on 03/24/22 at  9:00 AM EDT by a video enabled telemedicine application and verified that I am speaking with the correct person using two identifiers.   I discussed the limitations of evaluation and management by telemedicine and the availability of in person appointments. The patient expressed understanding and agreed to proceed.  I discussed the assessment and treatment plan with the patient. The patient was provided an opportunity to ask questions and all were answered. The patient agreed with the plan and demonstrated an understanding of the instructions.   The patient was advised to call back or seek an in-person evaluation if the symptoms worsen or if the condition fails to improve as anticipated.  I provided 30 minutes of non-face-to-face time during this encounter.  The patient was located at home.  The provider was located at [place].   Thayer Headings, PMHNP   Subjective:   Patient ID:  Kristin Sparks is a 66 y.o. (DOB 01-Mar-1956) female.  Chief Complaint:  Chief Complaint  Patient presents with   Sleeping Problem   Follow-up    Anxiety, depression    HPI [name] presents for follow-up of anxiety, depression, and insomnia. She reports that son's wedding went well. She took a Propranolol and Xanax before the rehearsal dinner and was able to make a speech. Took a Propranolol prn before bridal luncheon when she had to make a speech unexpectedly.  "It helped me relax and enjoy it." She reports that she has felt less anxious since the wedding. She reports that overall her anxiety has been controlled. Denies depressed mood. "I'm doing better." She reports that her mood is typically better in the summer. She decreased Cymbalta to 60 mg about a month ago after the wedding and "I'm doing alright." Energy and motivation have been good. Denies difficulty with  concentration. She has been able to accomplish some tasks. Sleeping ok. Difficulty falling asleep and will take hydroxyzine prn. She reports gaining 5-7 lbs and has been unable to lose this. Denies any change in food intake or appetite. Denies any change in appetite. Denies SI.   Past medication trials: Sertraline-diarrhea.  Felt calmer. Lexapro-prescribed for hot flashes.  Had increased anxiety while taking. Effexor XR-some benefit and then was less effective.  Signs and symptoms inadequately controlled at 75 mg and unable to tolerate higher doses. Cymbalta Buspar Propanolol Ativan Hydroxyzine Lyrica Gabapentin-did not help with neuropathy  Review of Systems:  Review of Systems  Cardiovascular:  Negative for palpitations.  Musculoskeletal:  Negative for gait problem.  Neurological:  Negative for tremors.  Psychiatric/Behavioral:         Please refer to HPI    Medications: I have reviewed the patient's current medications.  Current Outpatient Medications  Medication Sig Dispense Refill   calcium-vitamin D (OSCAL-500) 500-400 MG-UNIT tablet Take by mouth.     levocetirizine (XYZAL) 5 MG tablet Take 5 mg by mouth daily as needed.      lovastatin (MEVACOR) 20 MG tablet Take 20 mg by mouth once a week.      Multiple Vitamin (MULTIVITAMIN) tablet Take 1 tablet by mouth daily.     Omega-3 1000 MG CAPS Take by mouth.     propranolol (INDERAL) 10 MG tablet Take 1/2-1 tab po BID prn anxiety (Do not use if pulse is <70) 60 tablet 1   traZODone (DESYREL) 50 MG tablet Take 1/2-2  tabs po QHS prn insomnia 60 tablet 2   acetaminophen (TYLENOL) 325 MG tablet Take 650 mg by mouth every 6 (six) hours as needed. (Patient not taking: Reported on 03/24/2022)     albuterol (PROVENTIL HFA;VENTOLIN HFA) 108 (90 Base) MCG/ACT inhaler Inhale into the lungs.     buPROPion ER (WELLBUTRIN SR) 100 MG 12 hr tablet Take 1 tablet (100 mg total) by mouth every morning. 90 tablet 0   busPIRone (BUSPAR) 15 MG tablet  Take 1 tablet (15 mg total) by mouth every morning. 90 tablet 0   DULoxetine (CYMBALTA) 60 MG capsule Take 1 capsule (60 mg total) by mouth every morning. 90 capsule 1   Fluticasone-Salmeterol (ADVAIR DISKUS) 250-50 MCG/DOSE AEPB Inhale into the lungs.     hydrOXYzine (ATARAX/VISTARIL) 10 MG tablet Take 1 tablet (10 mg total) by mouth at bedtime as needed for up to 30 days. (Patient not taking: Reported on 11/11/2021) 30 tablet 2   LORazepam (ATIVAN) 0.5 MG tablet Take 1/2-1 tab po q 6 hours as needed for anxiety 15 tablet 0   meloxicam (MOBIC) 7.5 MG tablet Take 7.5 mg by mouth daily. (Patient not taking: Reported on 10/06/2021)     pregabalin (LYRICA) 75 MG capsule Take 1 capsule (75 mg total) by mouth every evening. (Patient taking differently: Take 75 mg by mouth as needed.) 90 capsule 1   No current facility-administered medications for this visit.    Medication Side Effects: Other: Possible weight gain  Allergies:  Allergies  Allergen Reactions   Latex    Sulfa Antibiotics Rash    Past Medical History:  Diagnosis Date   Allergic rhinitis    Asthma    Elevated cholesterol    Neuropathy     Family History  Problem Relation Age of Onset   Alcohol abuse Father    Depression Sister    Breast cancer Maternal Grandmother     Social History   Socioeconomic History   Marital status: Widowed    Spouse name: Not on file   Number of children: Not on file   Years of education: Not on file   Highest education level: Not on file  Occupational History   Not on file  Tobacco Use   Smoking status: Never   Smokeless tobacco: Never  Substance and Sexual Activity   Alcohol use: Not on file   Drug use: Not on file   Sexual activity: Not on file  Other Topics Concern   Not on file  Social History Narrative   Not on file   Social Determinants of Health   Financial Resource Strain: Not on file  Food Insecurity: Not on file  Transportation Needs: Not on file  Physical  Activity: Not on file  Stress: Not on file  Social Connections: Not on file  Intimate Partner Violence: Not on file    Past Medical History, Surgical history, Social history, and Family history were reviewed and updated as appropriate.   Please see review of systems for further details on the patient's review from today.   Objective:   Physical Exam:  There were no vitals taken for this visit.  Physical Exam Neurological:     Mental Status: She is alert and oriented to person, place, and time.     Cranial Nerves: No dysarthria.  Psychiatric:        Attention and Perception: Attention and perception normal.        Mood and Affect: Mood normal.  Speech: Speech normal.        Behavior: Behavior is cooperative.        Thought Content: Thought content normal. Thought content is not paranoid or delusional. Thought content does not include homicidal or suicidal ideation. Thought content does not include homicidal or suicidal plan.        Cognition and Memory: Cognition and memory normal.        Judgment: Judgment normal.     Comments: Insight intact     Lab Review:  No results found for: "NA", "K", "CL", "CO2", "GLUCOSE", "BUN", "CREATININE", "CALCIUM", "PROT", "ALBUMIN", "AST", "ALT", "ALKPHOS", "BILITOT", "GFRNONAA", "GFRAA"  No results found for: "WBC", "RBC", "HGB", "HCT", "PLT", "MCV", "MCH", "MCHC", "RDW", "LYMPHSABS", "MONOABS", "EOSABS", "BASOSABS"  No results found for: "POCLITH", "LITHIUM"   No results found for: "PHENYTOIN", "PHENOBARB", "VALPROATE", "CBMZ"   .res Assessment: Plan:    Pt seen for 30 minutes and time spent discussing sleep disturbance. Discussed potential benefits, risks, and side effects of Trazodone. Pt agrees to trial of Trazodone. Will start Trazodone 50 mg 1/2-2 tablets at bedtime as needed for insomnia.  Continue Wellbutrin SR 100 mg po q am for depression.  Continue Buspar 15 mg po q am for anxiety.  Continue Cymbalta 60 mg po qd for  depression and anxiety.  Continue Lorazepam 0.5 mg 1/2-1 tab po q 6 hours prn anxiety.  Continue Propranolol 10 mg 1/2-1 tab po BID prn anxiety.  Continue Hydroxyzine 10 mg po QHS prn insomnia if Trazodone is ineffective or not well tolerated.  Recommend continuing therapy with Bambi Cottle, LCSW.  Pt to follow-up in 3-4 months or sooner if clinically indicated.  Patient advised to contact office with any questions, adverse effects, or acute worsening in signs and symptoms.    Jasmarie was seen today for sleeping problem and follow-up.  Diagnoses and all orders for this visit:  Insomnia, unspecified type -     traZODone (DESYREL) 50 MG tablet; Take 1/2-2 tabs po QHS prn insomnia  Major depressive disorder, recurrent episode, moderate (HCC) -     buPROPion ER (WELLBUTRIN SR) 100 MG 12 hr tablet; Take 1 tablet (100 mg total) by mouth every morning.  Generalized anxiety disorder -     busPIRone (BUSPAR) 15 MG tablet; Take 1 tablet (15 mg total) by mouth every morning. -     DULoxetine (CYMBALTA) 60 MG capsule; Take 1 capsule (60 mg total) by mouth every morning.     Please see After Visit Summary for patient specific instructions.  Future Appointments  Date Time Provider [place]  04/13/2022  9:00 AM Cottle, Lucious Groves, LCSW [facility] None  04/27/2022  9:00 AM Cottle, Bambi G, LCSW [facility] None  05/11/2022  9:00 AM Cottle, Bambi G, LCSW [facility] None  05/25/2022  9:00 AM Cottle, Bambi G, LCSW [facility] None  06/08/2022  9:00 AM Cottle, Bambi G, LCSW [facility] None  06/22/2022  9:00 AM Cottle, Bambi G, LCSW [facility] None  07/06/2022  9:00 AM Cottle, Bambi G, LCSW [facility] None  07/20/2022  9:00 AM Cottle, Bambi G, LCSW [facility] None  08/03/2022  9:00 AM Cottle, Bambi G, LCSW [facility] None  08/17/2022  9:00 AM Cottle, Bambi G, LCSW [facility] None  08/31/2022  9:00 AM Cottle, Bambi G, LCSW [facility] None  09/14/2022  9:00 AM Cottle, Bambi G, LCSW [facility] None    No orders of the defined  types were placed in this encounter.     -------------------------------

## 2022-03-30 ENCOUNTER — Ambulatory Visit: Payer: Medicare Other | Admitting: Psychology

## 2022-03-31 ENCOUNTER — Other Ambulatory Visit: Payer: Self-pay | Admitting: Psychiatry

## 2022-03-31 DIAGNOSIS — F411 Generalized anxiety disorder: Secondary | ICD-10-CM

## 2022-04-13 ENCOUNTER — Ambulatory Visit (INDEPENDENT_AMBULATORY_CARE_PROVIDER_SITE_OTHER): Payer: Medicare Other | Admitting: Psychology

## 2022-04-13 DIAGNOSIS — F331 Major depressive disorder, recurrent, moderate: Secondary | ICD-10-CM | POA: Diagnosis not present

## 2022-04-13 DIAGNOSIS — F4323 Adjustment disorder with mixed anxiety and depressed mood: Secondary | ICD-10-CM | POA: Diagnosis not present

## 2022-04-13 DIAGNOSIS — F411 Generalized anxiety disorder: Secondary | ICD-10-CM

## 2022-04-13 NOTE — Progress Notes (Signed)
[facility] Counselor/Therapist Progress Note  Patient ID: Kristin Sparks, MRN: [id number],    Date: 04/13/2022  Time Spent: 60 minutes  Treatment Type: Individual Therapy  Reported Symptoms: anxiety and depression  Mental Status Exam: Appearance:  Casual     Behavior: Appropriate  Motor: Normal  Speech/Language:  Normal Rate  Affect: Blunt  Mood: anxious  Thought process: normal  Thought content:   WNL  Sensory/Perceptual disturbances:   WNL  Orientation: oriented to person, place, time/date, and situation  Attention: Good  Concentration: Good  Memory: WNL  Fund of knowledge:  Good  Insight:   Good  Judgment:  Good  Impulse Control: Good   Risk Assessment: Danger to Self:  No Self-injurious Behavior: No Danger to Others: No Duty to Warn:no Physical Aggression / Violence:No  Access to Firearms a concern: No  Gang Involvement:No   Subjective: The patient attended a face-to-face individual therapy session via video visit.  The patient gave verbal consent to have the session on video on WebEx.  The patient was in her home alone and the therapist was in the office.  The patient reports that she looked back at her notes from previous sessions and she is still in the same spot as she has been.  The patient is aware that it is her responsibility to help herself move forward.  We talked about the need for her to continue to explore her options and that she does not have to think of her movement as permanent and that that is the last decision that she is ever going to make.  We talked about things being fluid and that she can also you know make different decisions so that she can decide where it is that she wants to land.  The patient just that got back from the beach with her sister and had a great time and we talked about her doing some other things again that she might want to do.  Encouraged her to continue to think about ways to make changes so that she is not  feeling stuck.  Interventions: Cognitive Behavioral Therapy and Assertiveness/Communication  Diagnosis:Major depressive disorder, recurrent episode, moderate (HCC)  Generalized anxiety disorder  Adjustment disorder with mixed anxiety and depressed mood  Plan:  Client Abilities/Strengths  Intelligent, caring, motivated  Client Treatment Preferences  Outpatient individual therapy  Client Statement of Needs  " I need some help to deal with my situation", "I 'm not sure if I'm depressed or what?"  Treatment Level  Outpatient Individual therapy  Symptoms  Frustration and anxiety related to providing oversight and caretaking to an aging, ailing, and dependent  parent.: No Description Entered (Status: improved). Lack of energy.: No Description Entered (Status:  improved). Motor tension (e.g., restlessness, tiredness, shakiness, muscle tension).: No Description  Entered (Status: improved). Poor concentration and indecisiveness.: No Description Entered (Status:  improved). Social withdrawal.: No Description Entered (Status: improved).  Problems Addressed  Unipolar Depression, Phase Of Life Problems, Unipolar Depression, Anxiety, Unipolar Depression,  Phase Of Life Problems, Anxiety  Goals 1. Appropriately grieve the loss in order to normalize mood and to return  to previously adaptive level of functioning. 2. Balance life activities between consideration of others and development of own interests. Objective Apply problem-solving skills to current circumstances Target Date: 2022-10-15 Frequency: Biweekly Progress: 90 Modality: individual Related Interventions 1. Use modeling and role-playing with the client to apply the problem-solving approach to his/her  current circumstances (or assign "Applying Problem-Solving to Interpersonal Conflict" from  the Adult Psychotherapy Homework Planner by Bryn Gulling); encourage implementation of action  plan, reinforcing success and redirecting for  failure. Objective Implement new activities that increase a sense of satisfaction. Target Date: 2022-10-15 Frequency: Biweekly Progress: 90 Modality: individual Related Interventions 1. Develop a plan with the client to include activities that will increase his/her satisfaction, fulfill  his/her values, and improve the quality of his/her life. 3. Develop healthy interpersonal relationships that lead to the alleviation  and help prevent the relapse of depression. Objective Learn and implement problem-solving and decision-making skills. Target Date: 2022-10-15 Frequency: Biweekly Progress: 90 Modality: individual Related Interventions 1. Encourage in the client the development of a positive problem orientation in which problems  and solving them are viewed as a natural part of life and not something to be feared, despaired,  or avoided. 2. Conduct Problem-Solving Therapy  Implement new activities that increase a sense of satisfaction. Target Date: 2022-10-15 Frequency: Biweekly Progress: 90 Modality: individual Related Interventions 1. Develop a plan with the client to include activities that will increase his/her satisfaction, fulfill  his/her values, and improve the quality of his/her life. 3. Develop healthy interpersonal relationships that lead to the alleviation  and help prevent the relapse of depression. Objective Learn and implement problem-solving and decision-making skills. Target Date: 2022-10-15 Frequency: Biweekly Progress: 90 Modality: individual Related Interventions 1. Encourage in the client the development of a positive problem orientation in which problems  and solving them are viewed as a natural part of life and not something to be feared, despaired,  or avoided.  Objective Identify and replace thoughts and beliefs that support depression. Target Date: 2022-10-15 Frequency: Biweekly Progress: 90 Modality: individual Related Interventions 1. Explore and  restructure underlying assumptions and beliefs reflected in biased self-talk that  may put the client at risk for relapse or recurrence. 2. Facilitate and reinforce the client's shift from biased depressive self-talk and beliefs to realitybased cognitive messages that enhance self-confidence and increase adaptive actions (see  "Positive Self-Talk" in the Adult Psychotherapy Homework Planner by Bryn Gulling). 4. Enhance ability to effectively cope with the full variety of life's worries  and anxieties. Objective Identify, challenge, and replace biased, fearful self-talk with positive, realistic, and empowering selftalk. Target Date: 2022-10-15 Frequency: Biweekly Progress: 80 Modality: individual Related Interventions 1. Explore the client's schema and self-talk that mediate his/her fear response; assist him/her in  challenging the biases; replace the distorted messages with reality-based alternatives and  positive, realistic self-talk that will increase his/her self-confidence in coping with irrational  fears (see Cognitive Therapy of Anxiety Disorders by Alison Stalling). Objective Learn and implement problem-solving strategies for realistically addressing worries. Target Date: 2022-10-15 Frequency: Biweekly Progress: 60 Modality: individual Therapy 5. Recognize, accept, and cope with feelings of depression. 6. Resolve conflicted feelings and adapt to the new life circumstances. 7. Stabilize anxiety level while increasing ability to function on a daily  The patient is making progress and has approved this plan of treatment.  Ayvin Lipinski G Cephus Tupy, LCSW                  Jaianna Nicoll G Stanislaus Kaltenbach, LCSW               Kinslie Hove G Onetta Spainhower, LCSW               Lavanya Roa G Ovadia Lopp, LCSW               Madina Galati G Cruzita Lipa, LCSW  Aadon Gorelik G Darlisha Kelm, LCSW               Chelci Wintermute G Jamesha Ellsworth, LCSW               Ulysse Siemen G Baby Stairs,  LCSW               Arel Tippen G Mead Slane, LCSW               Cire Deyarmin G Kyley Laurel, LCSW               Lilian Fuhs G Shyvonne Chastang, LCSW               Lorry Anastasi G Khiyan Crace, LCSW

## 2022-04-27 ENCOUNTER — Ambulatory Visit (INDEPENDENT_AMBULATORY_CARE_PROVIDER_SITE_OTHER): Payer: Medicare Other | Admitting: Psychology

## 2022-04-27 DIAGNOSIS — F411 Generalized anxiety disorder: Secondary | ICD-10-CM

## 2022-04-27 DIAGNOSIS — F4323 Adjustment disorder with mixed anxiety and depressed mood: Secondary | ICD-10-CM

## 2022-04-27 DIAGNOSIS — F331 Major depressive disorder, recurrent, moderate: Secondary | ICD-10-CM

## 2022-04-27 NOTE — Progress Notes (Signed)
[facility] Counselor/Therapist Progress Note  Patient ID: Kristin Sparks, MRN: [id number],    Date: 04/27/2022  Time Spent: 60 minutes  Treatment Type: Individual Therapy  Reported Symptoms: anxiety and depression  Mental Status Exam: Appearance:  Casual     Behavior: Appropriate  Motor: Normal  Speech/Language:  Normal Rate  Affect: Blunt  Mood: anxious  Thought process: normal  Thought content:   WNL  Sensory/Perceptual disturbances:   WNL  Orientation: oriented to person, place, time/date, and situation  Attention: Good  Concentration: Good  Memory: WNL  Fund of knowledge:  Good  Insight:   Good  Judgment:  Good  Impulse Control: Good   Risk Assessment: Danger to Self:  No Self-injurious Behavior: No Danger to Others: No Duty to Warn:no Physical Aggression / Violence:No  Access to Firearms a concern: No  Gang Involvement:No   Subjective: The patient attended a face-to-face individual therapy session via video visit.  The patient gave verbal consent to have the session on video on WebEx.  The patient was in her home alone and the therapist was in the office.  The patient presents as pleasant but is still anxious.  The patient reports that she has been doing several different things lately and is going to [place] this weekend to spend time with her sister.  She has spent more time over in [place] and this seems to be good for her decision making.  The patient still is struggling with not knowing where she wants to move and being immobile.  We talked about her taking some effort to go visit some places in the cities where she is visiting to see if she still wants to move.  We talked about the importance of her going ahead and deciding where she has the most support and deciding if she wants to do anything about her living situation.  She feels like it is not home living where she stays however she is afraid to make any big decisions.  Encouraged her  to have conversations with her son and daughter.  Also recommended she have a conversation with her sister as her sister seems to be somewhat indecisive as well about how she wants to move forward with her life since her husband passed away.  Interventions: Cognitive Behavioral Therapy and Assertiveness/Communication  Diagnosis:Major depressive disorder, recurrent episode, moderate (HCC)  Generalized anxiety disorder  Adjustment disorder with mixed anxiety and depressed mood  Plan:  Client Abilities/Strengths  Intelligent, caring, motivated  Client Treatment Preferences  Outpatient individual therapy  Client Statement of Needs  " I need some help to deal with my situation", "I 'm not sure if I'm depressed or what?"  Treatment Level  Outpatient Individual therapy  Symptoms  Frustration and anxiety related to providing oversight and caretaking to an aging, ailing, and dependent  parent.: No Description Entered (Status: improved). Lack of energy.: No Description Entered (Status:  improved). Motor tension (e.g., restlessness, tiredness, shakiness, muscle tension).: No Description  Entered (Status: improved). Poor concentration and indecisiveness.: No Description Entered (Status:  improved). Social withdrawal.: No Description Entered (Status: improved).  Problems Addressed  Unipolar Depression, Phase Of Life Problems, Unipolar Depression, Anxiety, Unipolar Depression,  Phase Of Life Problems, Anxiety  Goals 1. Appropriately grieve the loss in order to normalize mood and to return  to previously adaptive level of functioning. 2. Balance life activities between consideration of others and development of own interests. Objective Apply problem-solving skills to current circumstances Target Date: 2022-10-15 Frequency: Biweekly Progress:  90 Modality: individual Related Interventions 1. Use modeling and role-playing with the client to apply the problem-solving approach to his/her  current  circumstances (or assign "Applying Problem-Solving to Interpersonal Conflict" from  the Adult Psychotherapy Homework Planner by [facility]); encourage implementation of action  plan, reinforcing success and redirecting for failure. Objective Implement new activities that increase a sense of satisfaction. Target Date: 2022-10-15 Frequency: Biweekly Progress: 90 Modality: individual Related Interventions 1. Develop a plan with the client to include activities that will increase his/her satisfaction, fulfill  his/her values, and improve the quality of his/her life. 3. Develop healthy interpersonal relationships that lead to the alleviation  and help prevent the relapse of depression. Objective Learn and implement problem-solving and decision-making skills. Target Date: 2022-10-15 Frequency: Biweekly Progress: 90 Modality: individual Related Interventions 1. Encourage in the client the development of a positive problem orientation in which problems  and solving them are viewed as a natural part of life and not something to be feared, despaired,  or avoided. 2. Conduct Problem-Solving Therapy  Implement new activities that increase a sense of satisfaction. Target Date: 2022-10-15 Frequency: Biweekly Progress: 90 Modality: individual Related Interventions 1. Develop a plan with the client to include activities that will increase his/her satisfaction, fulfill  his/her values, and improve the quality of his/her life. 3. Develop healthy interpersonal relationships that lead to the alleviation  and help prevent the relapse of depression. Objective Learn and implement problem-solving and decision-making skills. Target Date: 2022-10-15 Frequency: Biweekly Progress: 90 Modality: individual Related Interventions 1. Encourage in the client the development of a positive problem orientation in which problems  and solving them are viewed as a natural part of life and not something to be feared,  despaired,  or avoided.  Objective Identify and replace thoughts and beliefs that support depression. Target Date: 2022-10-15 Frequency: Biweekly Progress: 90 Modality: individual Related Interventions 1. Explore and restructure underlying assumptions and beliefs reflected in biased self-talk that  may put the client at risk for relapse or recurrence. 2. Facilitate and reinforce the client's shift from biased depressive self-talk and beliefs to realitybased cognitive messages that enhance self-confidence and increase adaptive actions (see  "Positive Self-Talk" in the Adult Psychotherapy Homework Planner by Bryn Gulling). 4. Enhance ability to effectively cope with the full variety of life's worries  and anxieties. Objective Identify, challenge, and replace biased, fearful self-talk with positive, realistic, and empowering selftalk. Target Date: 2022-10-15 Frequency: Biweekly Progress: 80 Modality: individual Related Interventions 1. Explore the client's schema and self-talk that mediate his/her fear response; assist him/her in  challenging the biases; replace the distorted messages with reality-based alternatives and  positive, realistic self-talk that will increase his/her self-confidence in coping with irrational  fears (see Cognitive Therapy of Anxiety Disorders by Alison Stalling). Objective Learn and implement problem-solving strategies for realistically addressing worries. Target Date: 2022-10-15 Frequency: Biweekly Progress: 60 Modality: individual Therapy 5. Recognize, accept, and cope with feelings of depression. 6. Resolve conflicted feelings and adapt to the new life circumstances. 7. Stabilize anxiety level while increasing ability to function on a daily  The patient is making progress and has approved this plan of treatment.  Semiyah Newgent G Maziyah Vessel, LCSW                  Icesis Renn G Emmry Hinsch, LCSW               Luzmaria Devaux G Jerlean Peralta,  LCSW               Rykin Route G  Famous Eisenhardt, LCSW               Edie Darley G Michala Deblanc, LCSW               Chinelo Benn G Raylynne Cubbage, LCSW               Delia Slatten G Carlynn Leduc, LCSW               Sherri Levenhagen G Ronit Cranfield, LCSW               Sherolyn Trettin G Shayleigh Bouldin, LCSW               Jailah Willis G Menachem Urbanek, LCSW               Mirca Yale G Rupa Lagan, LCSW               Diera Wirkkala G Shamir Sedlar, LCSW               Beulah Matusek G Hildagarde Holleran, LCSW

## 2022-05-06 IMAGING — US US BREAST*L* LIMITED INC AXILLA
1 series · 6 of 6 positions shown · non-contrast
Comparison: Previous exam(s).

CLINICAL DATA: Patient reports pain along the medial aspect of the
left breast. Did sustain injury months ago. No reported lumps.

EXAM:
DIGITAL DIAGNOSTIC UNILATERAL LEFT MAMMOGRAM WITH TOMOSYNTHESIS AND
CAD; ULTRASOUND LEFT BREAST LIMITED
TECHNIQUE: Left digital diagnostic mammography and breast tomosynthesis was
performed. The images were evaluated with computer-aided detection.;
Targeted ultrasound examination of the left breast was performed.

[Series 1: us breast*left* limited inc axilla · 0.08mm/px · 6 of 6 slices shown]
[im 1/6]
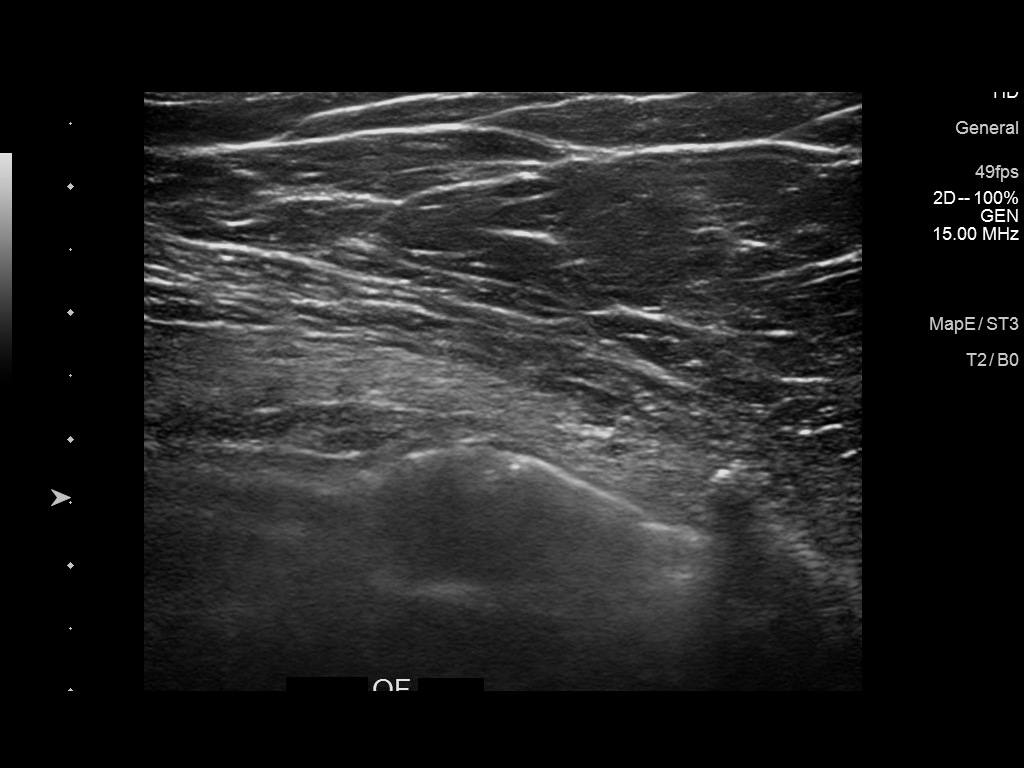
[im 2/6]
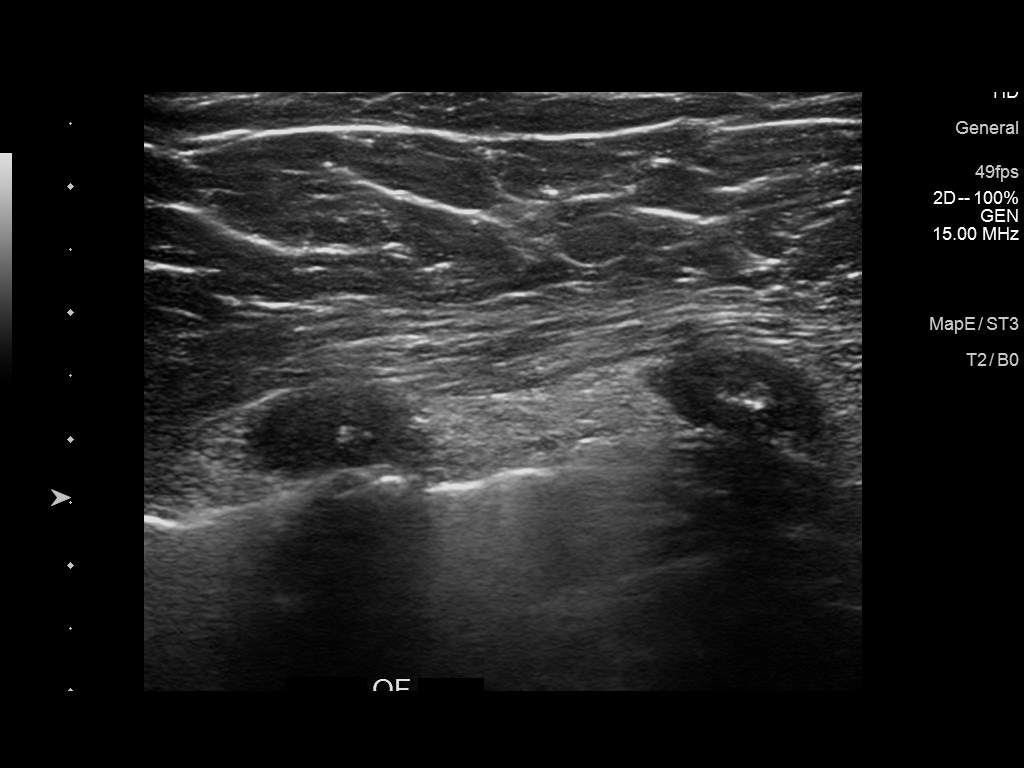
[im 3/6]
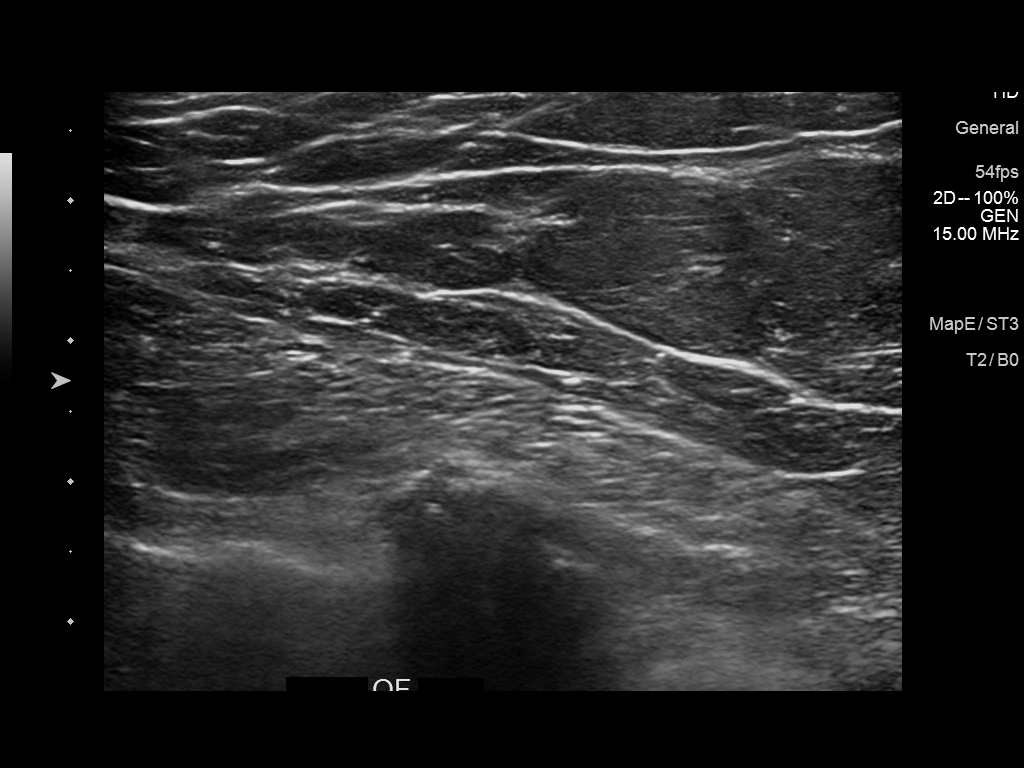
[im 4/6]
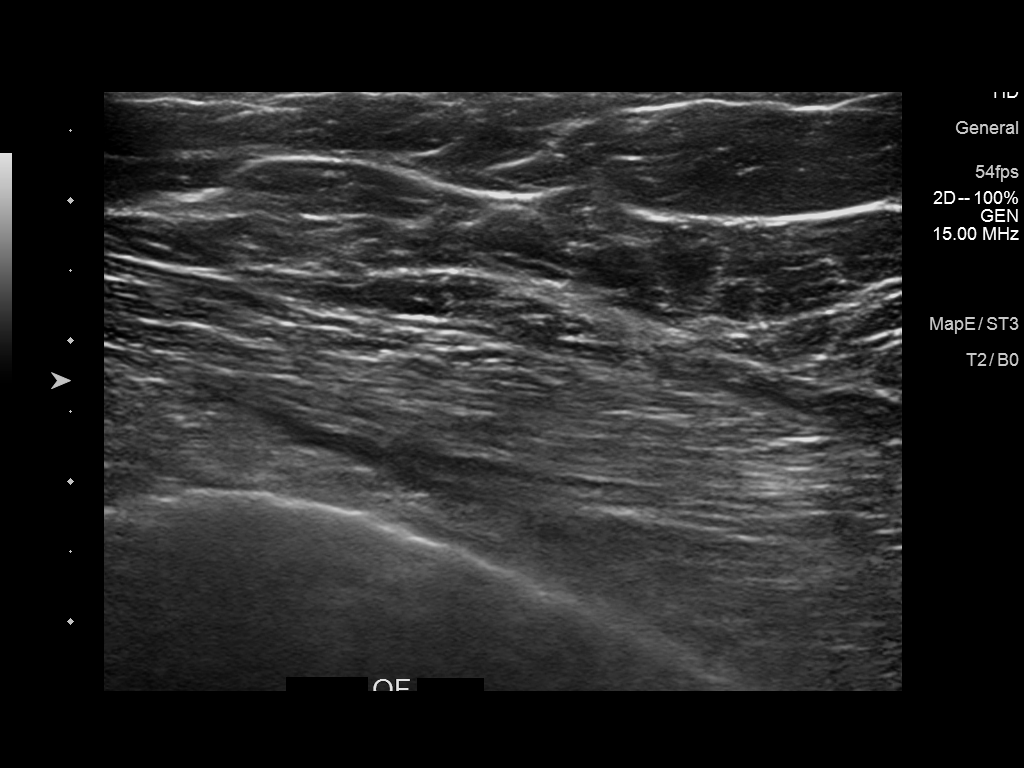
[im 5/6]
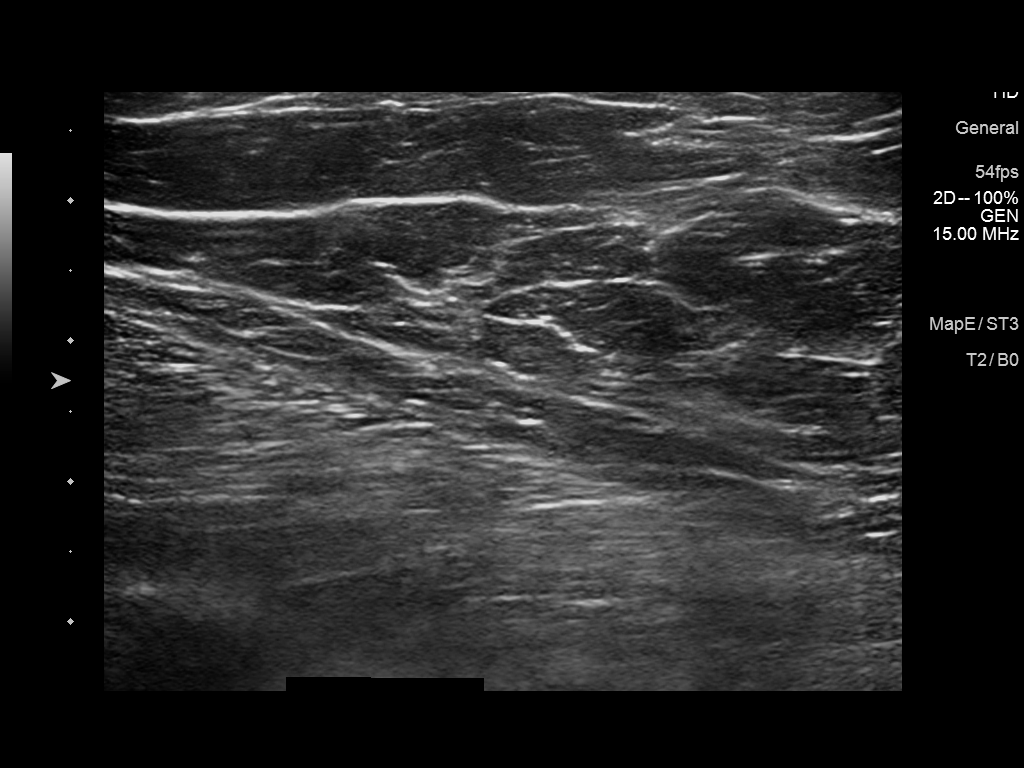
[im 6/6]
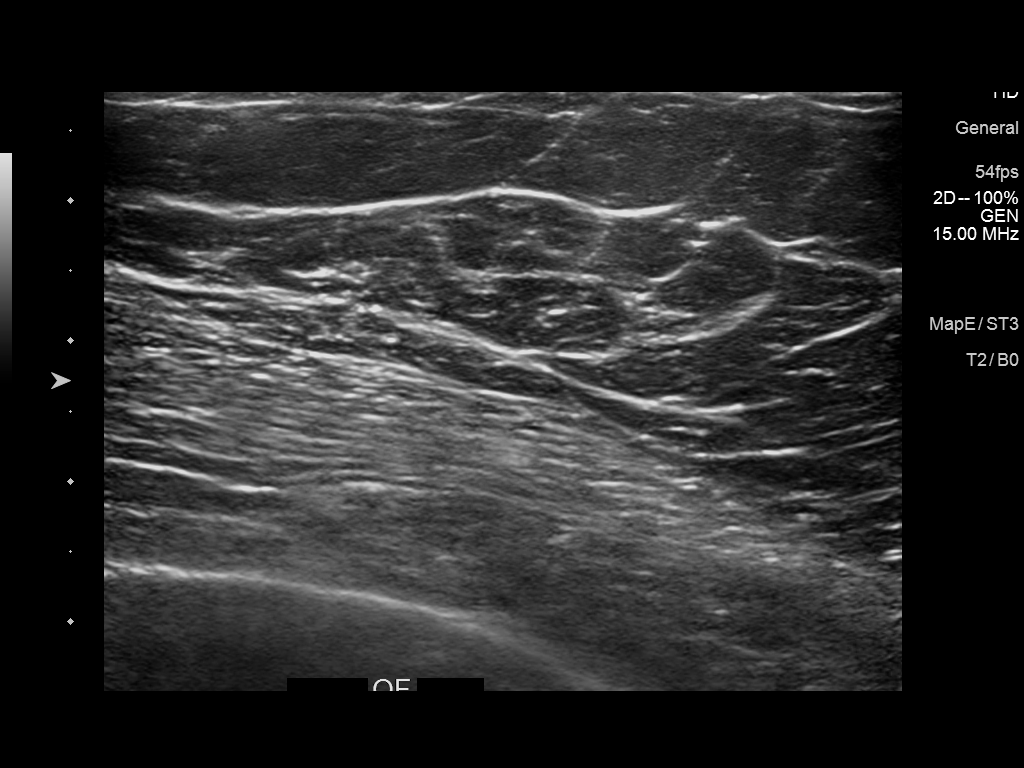

[6 of 6 positions shown; findings below may reference images not displayed]

ACR Breast Density Category b: There are scattered areas of
fibroglandular density.
FINDINGS: There are no masses, areas of architectural distortion, areas of
significant asymmetry or suspicious calcifications. No mammographic
change.

On physical exam, no mass is palpated along the medial left breast.
Patient is tender to deep palpation.

Targeted ultrasound is performed, showing normal tissue throughout
the medial left breast. No mass or suspicious lesion.
IMPRESSION: Negative exam.  No evidence of breast malignancy.

RECOMMENDATION:
Annual screening mammography, last screening study performed on
04/27/2021.

I have discussed the findings and recommendations with the patient.
If applicable, a reminder letter will be sent to the patient
regarding the next appointment.

BI-RADS CATEGORY  1: Negative.

## 2022-05-06 IMAGING — MG MM DIGITAL DIAGNOSTIC UNILAT*L* W/ TOMO W/ CAD
4 series · 4 of 12 positions shown · non-contrast
Comparison: Previous exam(s).

CLINICAL DATA: Patient reports pain along the medial aspect of the
left breast. Did sustain injury months ago. No reported lumps.

EXAM:
DIGITAL DIAGNOSTIC UNILATERAL LEFT MAMMOGRAM WITH TOMOSYNTHESIS AND
CAD; ULTRASOUND LEFT BREAST LIMITED
TECHNIQUE: Left digital diagnostic mammography and breast tomosynthesis was
performed. The images were evaluated with computer-aided detection.;
Targeted ultrasound examination of the left breast was performed.

[L MLO synth-2D]
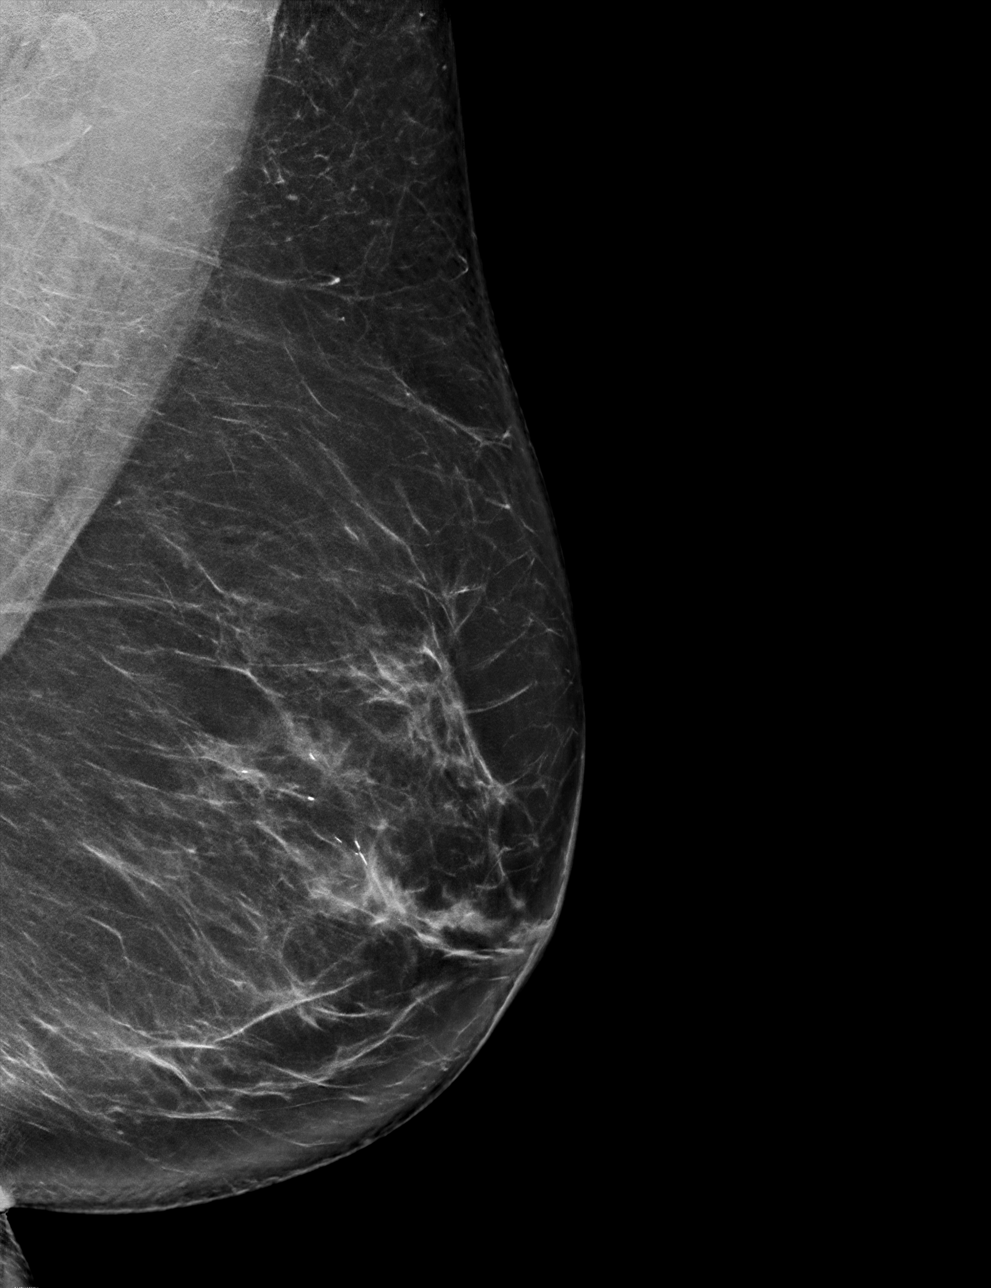

[L CC synth-2D]
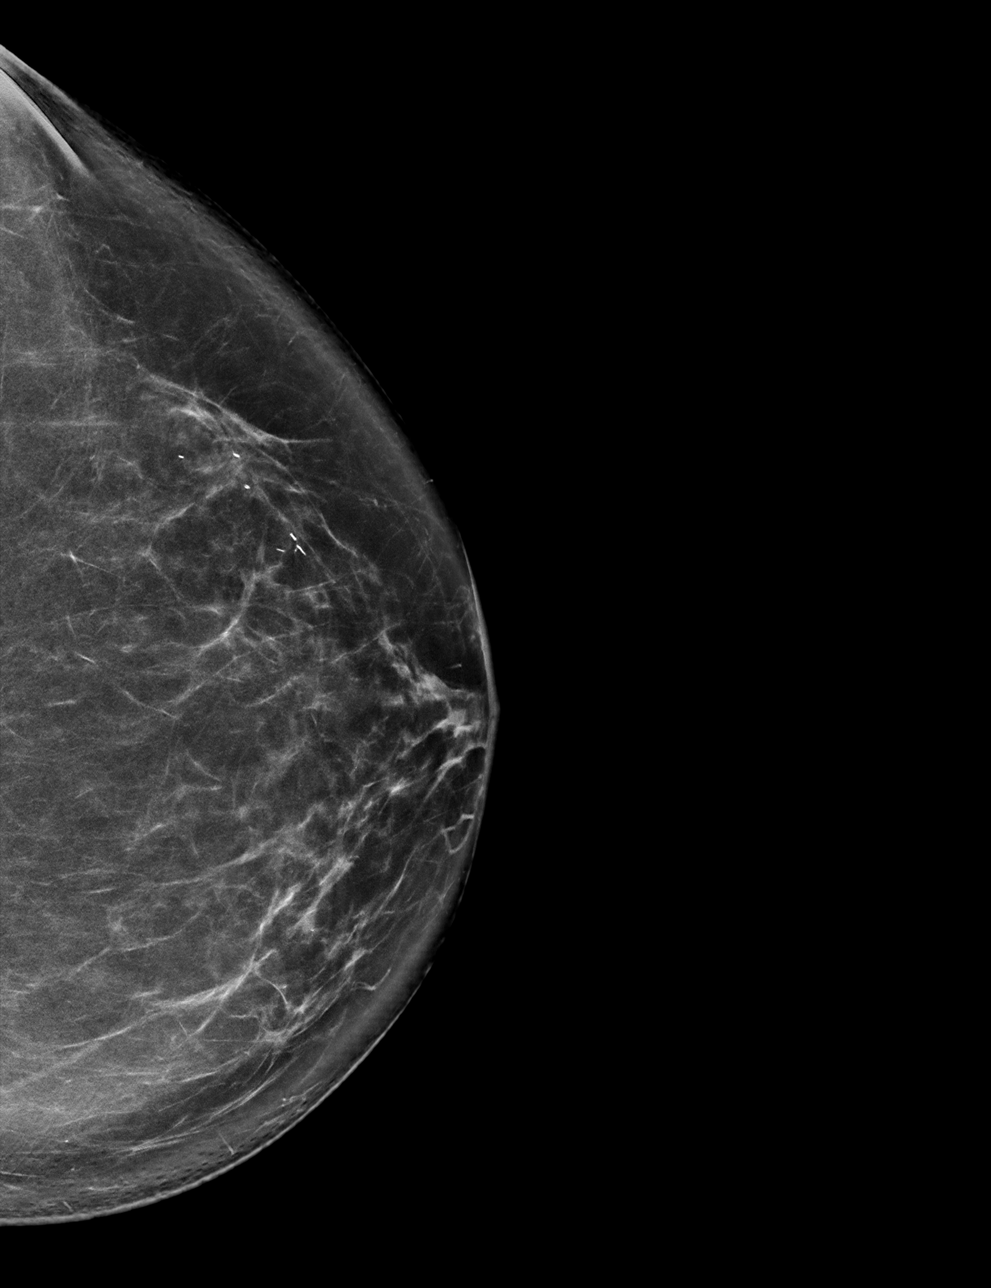

[L CC tomo · tomo slice 41/80.0]
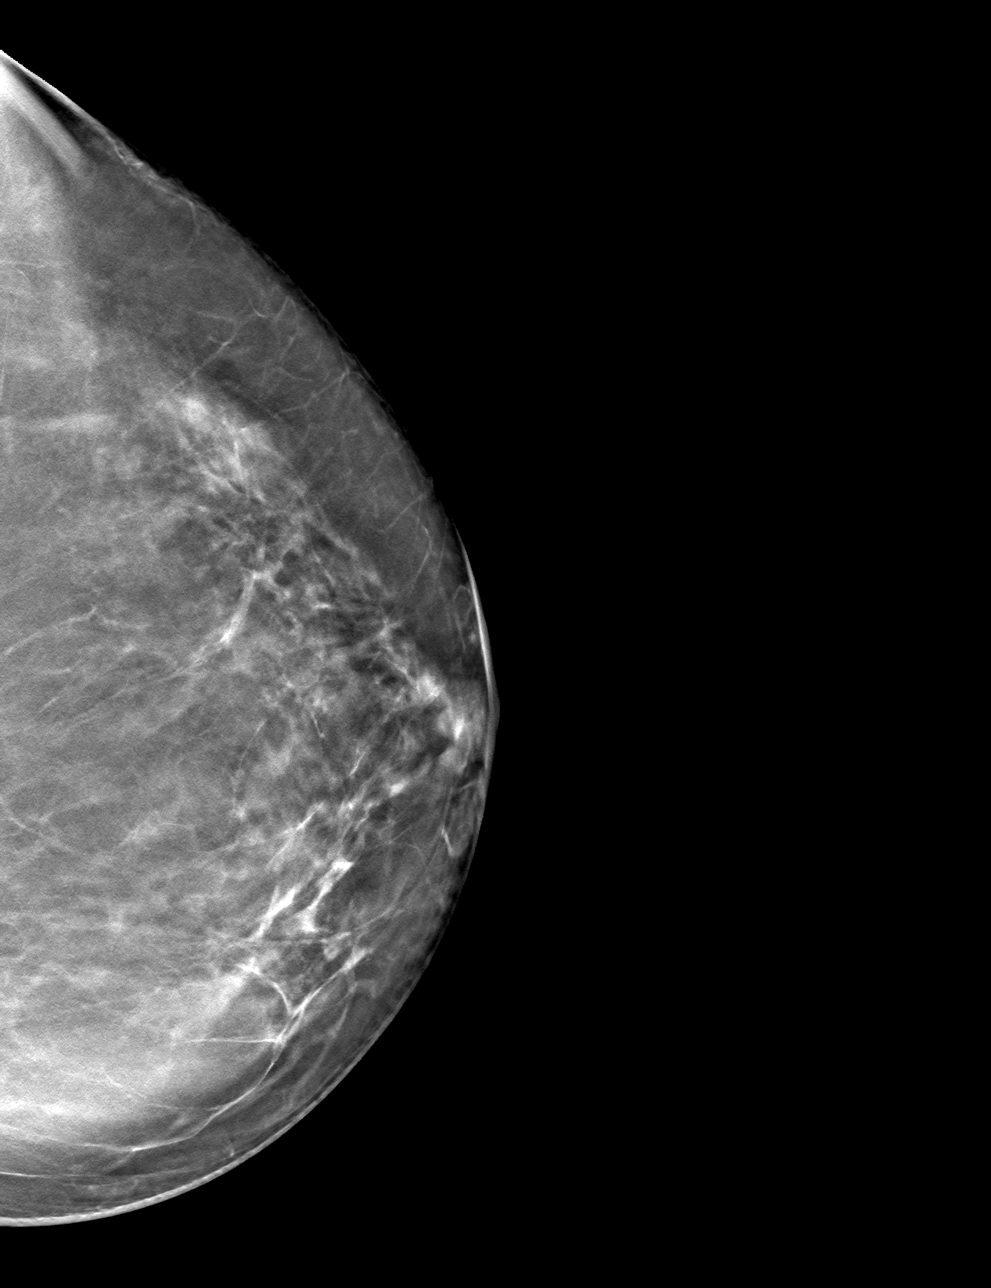

[L MLO tomo · tomo slice 41/82.0]
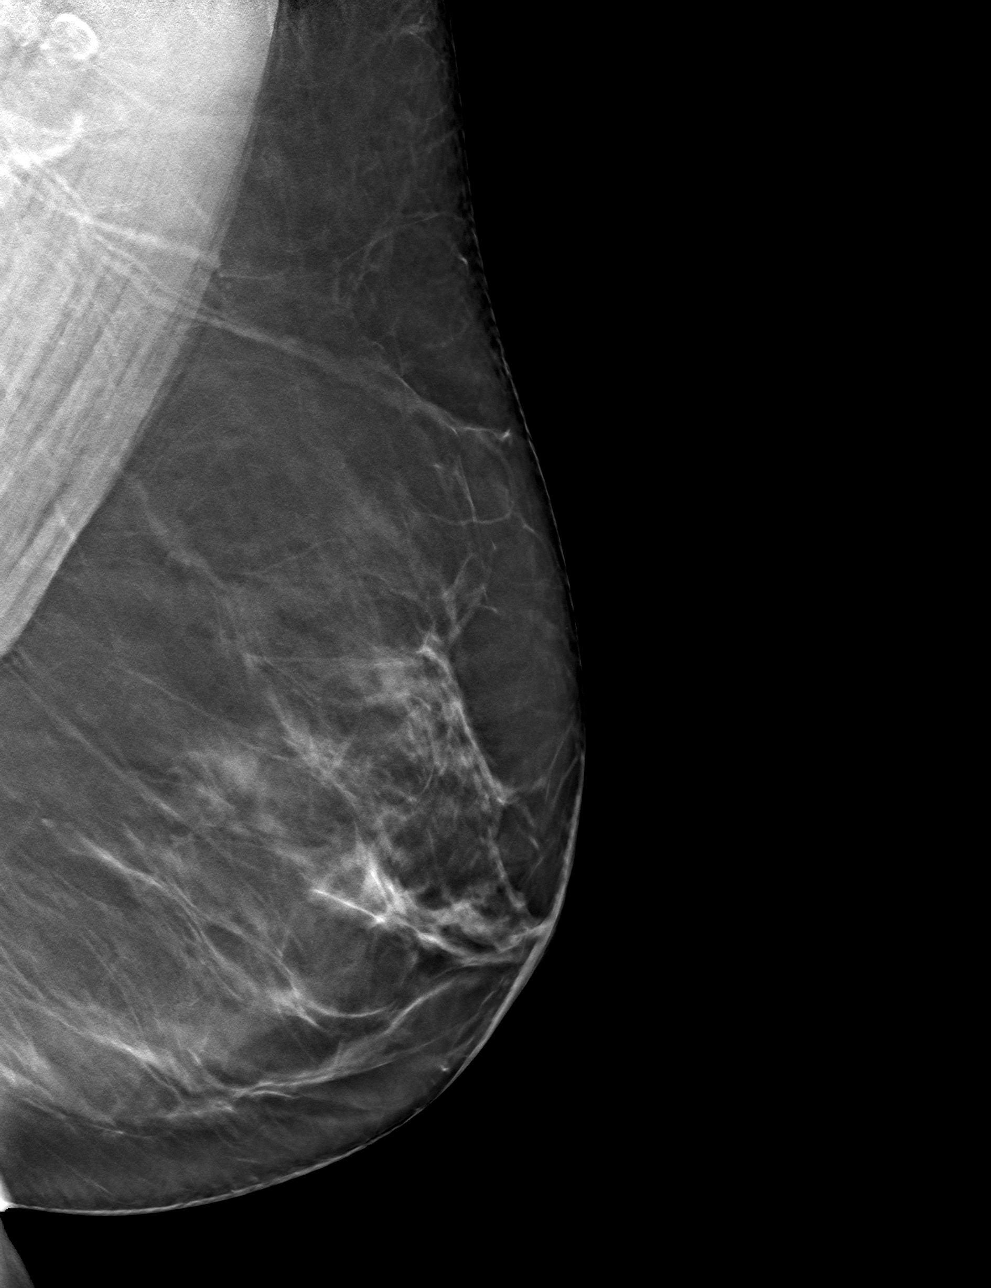

[4 of 12 positions shown; findings below may reference images not displayed]

ACR Breast Density Category b: There are scattered areas of
fibroglandular density.
FINDINGS: There are no masses, areas of architectural distortion, areas of
significant asymmetry or suspicious calcifications. No mammographic
change.

On physical exam, no mass is palpated along the medial left breast.
Patient is tender to deep palpation.

Targeted ultrasound is performed, showing normal tissue throughout
the medial left breast. No mass or suspicious lesion.
IMPRESSION: Negative exam.  No evidence of breast malignancy.

RECOMMENDATION:
Annual screening mammography, last screening study performed on
04/27/2021.

I have discussed the findings and recommendations with the patient.
If applicable, a reminder letter will be sent to the patient
regarding the next appointment.

BI-RADS CATEGORY  1: Negative.

## 2022-05-11 ENCOUNTER — Ambulatory Visit (INDEPENDENT_AMBULATORY_CARE_PROVIDER_SITE_OTHER): Payer: Medicare Other | Admitting: Psychology

## 2022-05-11 DIAGNOSIS — F331 Major depressive disorder, recurrent, moderate: Secondary | ICD-10-CM

## 2022-05-11 DIAGNOSIS — F4323 Adjustment disorder with mixed anxiety and depressed mood: Secondary | ICD-10-CM

## 2022-05-11 DIAGNOSIS — F411 Generalized anxiety disorder: Secondary | ICD-10-CM

## 2022-05-11 NOTE — Progress Notes (Signed)
[facility] Counselor/Therapist Progress Note  Patient ID: Kristin Sparks, MRN: [id number],    Date: 05/11/2022  Time Spent: 60 minutes  Treatment Type: Individual Therapy  Reported Symptoms: anxiety and depression  Mental Status Exam: Appearance:  Casual     Behavior: Appropriate  Motor: Normal  Speech/Language:  Normal Rate  Affect: Blunt  Mood: anxious  Thought process: normal  Thought content:   WNL  Sensory/Perceptual disturbances:   WNL  Orientation: oriented to person, place, time/date, and situation  Attention: Good  Concentration: Good  Memory: WNL  Fund of knowledge:  Good  Insight:   Good  Judgment:  Good  Impulse Control: Good   Risk Assessment: Danger to Self:  No Self-injurious Behavior: No Danger to Others: No Duty to Warn:no Physical Aggression / Violence:No  Access to Firearms a concern: No  Gang Involvement:No   Subjective: The patient attended a face-to-face individual therapy session via video visit.  The patient gave verbal consent to have the session on video on WebEx.  The patient was in her home alone and the therapist was in the office.  The patient presents as pleasant but is still anxious.  The patient reports that she was down at her sister's house for about 11 days and she had a nice visit.  The patient is still in a quandary as to whether to move there or possibly move to [facility].  We talked about the need for her to can go ahead and continue to do things that she wants to do while she is capable of doing so and she is young.  The patient seemed to gain some insight today about that.  She talked about possibly going and staying in her son's apartment while they are moving into their house so that she can get a better idea about whether she might want to move to [facility].  The patient is still stuck with trying to decide what she wants to do moving forward.  Interventions: Cognitive Behavioral Therapy and  Assertiveness/Communication  Diagnosis:Major depressive disorder, recurrent episode, moderate (HCC)  Generalized anxiety disorder  Adjustment disorder with mixed anxiety and depressed mood  Plan:  Client Abilities/Strengths  Intelligent, caring, motivated  Client Treatment Preferences  Outpatient individual therapy  Client Statement of Needs  " I need some help to deal with my situation", "I 'm not sure if I'm depressed or what?"  Treatment Level  Outpatient Individual therapy  Symptoms  Frustration and anxiety related to providing oversight and caretaking to an aging, ailing, and dependent  parent.: No Description Entered (Status: improved). Lack of energy.: No Description Entered (Status:  improved). Motor tension (e.g., restlessness, tiredness, shakiness, muscle tension).: No Description  Entered (Status: improved). Poor concentration and indecisiveness.: No Description Entered (Status:  improved). Social withdrawal.: No Description Entered (Status: improved).  Problems Addressed  Unipolar Depression, Phase Of Life Problems, Unipolar Depression, Anxiety, Unipolar Depression,  Phase Of Life Problems, Anxiety  Goals 1. Appropriately grieve the loss in order to normalize mood and to return  to previously adaptive level of functioning. 2. Balance life activities between consideration of others and development of own interests. Objective Apply problem-solving skills to current circumstances Target Date: 2022-10-15 Frequency: Biweekly Progress: 90 Modality: individual Related Interventions 1. Use modeling and role-playing with the client to apply the problem-solving approach to his/her  current circumstances (or assign "Applying Problem-Solving to Interpersonal Conflict" from  the Adult Psychotherapy Homework Planner by Bryn Gulling); encourage implementation of action  plan, reinforcing success and redirecting  for failure. Objective Implement new activities that increase a sense of  satisfaction. Target Date: 2022-10-15 Frequency: Biweekly Progress: 90 Modality: individual Related Interventions 1. Develop a plan with the client to include activities that will increase his/her satisfaction, fulfill  his/her values, and improve the quality of his/her life. 3. Develop healthy interpersonal relationships that lead to the alleviation  and help prevent the relapse of depression. Objective Learn and implement problem-solving and decision-making skills. Target Date: 2022-10-15 Frequency: Biweekly Progress: 90 Modality: individual Related Interventions 1. Encourage in the client the development of a positive problem orientation in which problems  and solving them are viewed as a natural part of life and not something to be feared, despaired,  or avoided. 2. Conduct Problem-Solving Therapy  Implement new activities that increase a sense of satisfaction. Target Date: 2022-10-15 Frequency: Biweekly Progress: 90 Modality: individual Related Interventions 1. Develop a plan with the client to include activities that will increase his/her satisfaction, fulfill  his/her values, and improve the quality of his/her life. 3. Develop healthy interpersonal relationships that lead to the alleviation  and help prevent the relapse of depression. Objective Learn and implement problem-solving and decision-making skills. Target Date: 2022-10-15 Frequency: Biweekly Progress: 90 Modality: individual Related Interventions 1. Encourage in the client the development of a positive problem orientation in which problems  and solving them are viewed as a natural part of life and not something to be feared, despaired,  or avoided.  Objective Identify and replace thoughts and beliefs that support depression. Target Date: 2022-10-15 Frequency: Biweekly Progress: 90 Modality: individual Related Interventions 1. Explore and restructure underlying assumptions and beliefs reflected in biased  self-talk that  may put the client at risk for relapse or recurrence. 2. Facilitate and reinforce the client's shift from biased depressive self-talk and beliefs to realitybased cognitive messages that enhance self-confidence and increase adaptive actions (see  "Positive Self-Talk" in the Adult Psychotherapy Homework Planner by Bryn Gulling). 4. Enhance ability to effectively cope with the full variety of life's worries  and anxieties. Objective Identify, challenge, and replace biased, fearful self-talk with positive, realistic, and empowering selftalk. Target Date: 2022-10-15 Frequency: Biweekly Progress: 80 Modality: individual Related Interventions 1. Explore the client's schema and self-talk that mediate his/her fear response; assist him/her in  challenging the biases; replace the distorted messages with reality-based alternatives and  positive, realistic self-talk that will increase his/her self-confidence in coping with irrational  fears (see Cognitive Therapy of Anxiety Disorders by Alison Stalling). Objective Learn and implement problem-solving strategies for realistically addressing worries. Target Date: 2022-10-15 Frequency: Biweekly Progress: 60 Modality: individual Therapy 5. Recognize, accept, and cope with feelings of depression. 6. Resolve conflicted feelings and adapt to the new life circumstances. 7. Stabilize anxiety level while increasing ability to function on a daily  The patient is making progress and has approved this plan of treatment.  Gene Colee G Sekou Zuckerman, LCSW                  Ashby Moskal G Laithan Conchas, LCSW               Aser Nylund G Janaia Kozel, LCSW               Andreu Drudge G Shreyas Piatkowski, LCSW               Amaree Leeper G Debara Kamphuis, LCSW               Caelynn Marshman G Kamali Nephew, LCSW  Janos Shampine G Alanny Rivers, LCSW               Grantland Want G Naoki Migliaccio, LCSW               Nykeem Citro G Chris Narasimhan,  LCSW               Jasminne Mealy G Fareeda Downard, LCSW               Chaise Passarella G Jaquelin Meaney, LCSW               Paisyn Guercio G Aariona Momon, LCSW               Jahmia Berrett G Veretta Sabourin, LCSW               Zuleima Haser G Joycelyn Liska, LCSW

## 2022-05-25 ENCOUNTER — Ambulatory Visit (INDEPENDENT_AMBULATORY_CARE_PROVIDER_SITE_OTHER): Payer: Medicare Other | Admitting: Psychology

## 2022-05-25 DIAGNOSIS — F331 Major depressive disorder, recurrent, moderate: Secondary | ICD-10-CM | POA: Diagnosis not present

## 2022-05-25 DIAGNOSIS — F4323 Adjustment disorder with mixed anxiety and depressed mood: Secondary | ICD-10-CM

## 2022-05-25 DIAGNOSIS — F411 Generalized anxiety disorder: Secondary | ICD-10-CM | POA: Diagnosis not present

## 2022-05-25 NOTE — Progress Notes (Signed)
[facility] Counselor/Therapist Progress Note  Patient ID: ENORA TRILLO, MRN: [id number],    Date: 05/25/2022  Time Spent: 60 minutes  Treatment Type: Individual Therapy  Reported Symptoms: anxiety and depression  Mental Status Exam: Appearance:  Casual     Behavior: Appropriate  Motor: Normal  Speech/Language:  Normal Rate  Affect: Blunt  Mood: anxious  Thought process: normal  Thought content:   WNL  Sensory/Perceptual disturbances:   WNL  Orientation: oriented to person, place, time/date, and situation  Attention: Good  Concentration: Good  Memory: WNL  Fund of knowledge:  Good  Insight:   Good  Judgment:  Good  Impulse Control: Good   Risk Assessment: Danger to Self:  No Self-injurious Behavior: No Danger to Others: No Duty to Warn:no Physical Aggression / Violence:No  Access to Firearms a concern: No  Gang Involvement:No   Subjective: The patient attended a face-to-face individual therapy session via video visit.  The patient gave verbal consent to have the session on video on WebEx.  The patient was in her home alone and the therapist was in the office.  The patient reports that she stayed home over the last 2 weeks.  The patient says that she is not sure why she did as [place]  The patient states that she did not even have the dog over the last 2 weeks.  The patient continues to be fearful about making any changes in her life.  We talked about over the next 2 weeks for her to speak with her son about possibly staying in their apartment in [place] to see how she likes being there.  The patient also is supposed to go to the beach with her sister and is looking forward to this.  The patient continues to have difficulty with making decisions about what to do next.  We will continue to help patient work through making decisions about her life and how she wants it to move forward.  Interventions: Cognitive  Behavioral Therapy and Assertiveness/Communication  Diagnosis:Major depressive disorder, recurrent episode, moderate (HCC)  Generalized anxiety disorder  Adjustment disorder with mixed anxiety and depressed mood  Plan:  Client Abilities/Strengths  Intelligent, caring, motivated  Client Treatment Preferences  Outpatient individual therapy  Client Statement of Needs  " I need some help to deal with my situation", "I 'm not sure if I'm depressed or what?"  Treatment Level  Outpatient Individual therapy  Symptoms  Frustration and anxiety related to providing oversight and caretaking to an aging, ailing, and dependent  parent.: No Description Entered (Status: improved). Lack of energy.: No Description Entered (Status:  improved). Motor tension (e.g., restlessness, tiredness, shakiness, muscle tension).: No Description  Entered (Status: improved). Poor concentration and indecisiveness.: No Description Entered (Status:  improved). Social withdrawal.: No Description Entered (Status: improved).  Problems Addressed  Unipolar Depression, Phase Of Life Problems, Unipolar Depression, Anxiety, Unipolar Depression,  Phase Of Life Problems, Anxiety  Goals 1. Appropriately grieve the loss in order to normalize mood and to return  to previously adaptive level of functioning. 2. Balance life activities between consideration of others and development of own interests. Objective Apply problem-solving skills to current circumstances Target Date: 2022-10-15 Frequency: Biweekly Progress: 90 Modality: individual Related Interventions 1. Use modeling and role-playing with the client to apply the problem-solving approach to his/her  current circumstances (or assign "Applying Problem-Solving to Interpersonal Conflict" from  the Adult Psychotherapy Homework Planner by Bryn Gulling); encourage implementation of action  plan, reinforcing success and redirecting for failure. Objective Implement new activities  that increase a sense of satisfaction. Target Date: 2022-10-15 Frequency: Biweekly Progress: 90 Modality: individual Related Interventions 1. Develop a plan with the client to include activities that will increase his/her satisfaction, fulfill  his/her values, and improve the quality of his/her life. 3. Develop healthy interpersonal relationships that lead to the alleviation  and help prevent the relapse of depression. Objective Learn and implement problem-solving and decision-making skills. Target Date: 2022-10-15 Frequency: Biweekly Progress: 90 Modality: individual Related Interventions 1. Encourage in the client the development of a positive problem orientation in which problems  and solving them are viewed as a natural part of life and not something to be feared, despaired,  or avoided. 2. Conduct Problem-Solving Therapy  Implement new activities that increase a sense of satisfaction. Target Date: 2022-10-15 Frequency: Biweekly Progress: 90 Modality: individual Related Interventions 1. Develop a plan with the client to include activities that will increase his/her satisfaction, fulfill  his/her values, and improve the quality of his/her life. 3. Develop healthy interpersonal relationships that lead to the alleviation  and help prevent the relapse of depression. Objective Learn and implement problem-solving and decision-making skills. Target Date: 2022-10-15 Frequency: Biweekly Progress: 90 Modality: individual Related Interventions 1. Encourage in the client the development of a positive problem orientation in which problems  and solving them are viewed as a natural part of life and not something to be feared, despaired,  or avoided.  Objective Identify and replace thoughts and beliefs that support depression. Target Date: 2022-10-15 Frequency: Biweekly Progress: 90 Modality: individual Related Interventions 1. Explore and restructure underlying assumptions and beliefs  reflected in biased self-talk that  may put the client at risk for relapse or recurrence. 2. Facilitate and reinforce the client's shift from biased depressive self-talk and beliefs to realitybased cognitive messages that enhance self-confidence and increase adaptive actions (see  "Positive Self-Talk" in the Adult Psychotherapy Homework Planner by Bryn Gulling). 4. Enhance ability to effectively cope with the full variety of life's worries  and anxieties. Objective Identify, challenge, and replace biased, fearful self-talk with positive, realistic, and empowering selftalk. Target Date: 2022-10-15 Frequency: Biweekly Progress: 80 Modality: individual Related Interventions 1. Explore the client's schema and self-talk that mediate his/her fear response; assist him/her in  challenging the biases; replace the distorted messages with reality-based alternatives and  positive, realistic self-talk that will increase his/her self-confidence in coping with irrational  fears (see Cognitive Therapy of Anxiety Disorders by Alison Stalling). Objective Learn and implement problem-solving strategies for realistically addressing worries. Target Date: 2022-10-15 Frequency: Biweekly Progress: 60 Modality: individual Therapy 5. Recognize, accept, and cope with feelings of depression. 6. Resolve conflicted feelings and adapt to the new life circumstances. 7. Stabilize anxiety level while increasing ability to function on a daily  The patient is making progress and has approved this plan of treatment.  Sharonna Vinje G Dalani Mette, LCSW                  Kineta Fudala G Travion Ke, LCSW               Devonn Giampietro G Destan Franchini, LCSW               Bracha Frankowski G Ernest Popowski, LCSW               Romari Gasparro G Damonique Brunelle, LCSW               Montavis Schubring G Lamari Beckles, LCSW  Promiss Labarbera G Makiyla Linch, LCSW               Avey Mcmanamon G Uriel Horkey, LCSW               Ladelle Teodoro G Malayla Granberry,  LCSW               Deeann Servidio G Itzabella Sorrels, LCSW               Missy Baksh G Philicia Heyne, LCSW               Shynia Daleo G Kennya Schwenn, LCSW               Dow Blahnik G Eisley Barber, LCSW               Jazz Biddy G Odessa Morren, LCSW               Kalaysia Demonbreun G Melvine Julin, LCSW

## 2022-06-02 ENCOUNTER — Other Ambulatory Visit: Payer: Self-pay | Admitting: Psychiatry

## 2022-06-02 DIAGNOSIS — F331 Major depressive disorder, recurrent, moderate: Secondary | ICD-10-CM

## 2022-06-08 ENCOUNTER — Ambulatory Visit (INDEPENDENT_AMBULATORY_CARE_PROVIDER_SITE_OTHER): Payer: Medicare Other | Admitting: Psychology

## 2022-06-08 DIAGNOSIS — F331 Major depressive disorder, recurrent, moderate: Secondary | ICD-10-CM

## 2022-06-08 DIAGNOSIS — F4323 Adjustment disorder with mixed anxiety and depressed mood: Secondary | ICD-10-CM | POA: Diagnosis not present

## 2022-06-08 DIAGNOSIS — F411 Generalized anxiety disorder: Secondary | ICD-10-CM | POA: Diagnosis not present

## 2022-06-08 NOTE — Progress Notes (Signed)
[facility] Counselor/Therapist Progress Note  Patient ID: Kristin Sparks, MRN: [id number],    Date: 06/08/2022  Time Spent: 60 minutes  Treatment Type: Individual Therapy  Reported Symptoms: anxiety and depression  Mental Status Exam: Appearance:  Casual     Behavior: Appropriate  Motor: Normal  Speech/Language:  Normal Rate  Affect: Blunt  Mood: anxious  Thought process: normal  Thought content:   WNL  Sensory/Perceptual disturbances:   WNL  Orientation: oriented to person, place, time/date, and situation  Attention: Good  Concentration: Good  Memory: WNL  Fund of knowledge:  Good  Insight:   Good  Judgment:  Good  Impulse Control: Good   Risk Assessment: Danger to Self:  No Self-injurious Behavior: No Danger to Others: No Duty to Warn:no Physical Aggression / Violence:No  Access to Firearms a concern: No  Gang Involvement:No   Subjective: The patient attended a face-to-face individual therapy session via video visit.  The patient gave verbal consent to have the session on video on WebEx.  The patient was in her home alone and the therapist was in the office.  The patient reports that she just got home from a 2-week beach trip.  She said she had a great time with her brother and sister.  The patient is still struggling with making a decision about what she wants to do and now has another option available to her.  She states that she could possibly buy into the house where her sister has a share and then they would have 8 weeks altogether.  I recommended that she write down the options that she has and see what she might want to mix and match to make the kind of life that she is interested in having.  She struggles with making decisions and sticking to them.  We also talked about her son and the need for her to set some better limits with him as well because he depends a great deal on her.  Interventions: Cognitive Behavioral Therapy and  Assertiveness/Communication  Diagnosis:Major depressive disorder, recurrent episode, moderate (HCC)  Generalized anxiety disorder  Adjustment disorder with mixed anxiety and depressed mood  Plan:  Client Abilities/Strengths  Intelligent, caring, motivated  Client Treatment Preferences  Outpatient individual therapy  Client Statement of Needs  " I need some help to deal with my situation", "I 'm not sure if I'm depressed or what?"  Treatment Level  Outpatient Individual therapy  Symptoms  Frustration and anxiety related to providing oversight and caretaking to an aging, ailing, and dependent  parent.: No Description Entered (Status: improved). Lack of energy.: No Description Entered (Status:  improved). Motor tension (e.g., restlessness, tiredness, shakiness, muscle tension).: No Description  Entered (Status: improved). Poor concentration and indecisiveness.: No Description Entered (Status:  improved). Social withdrawal.: No Description Entered (Status: improved).  Problems Addressed  Unipolar Depression, Phase Of Life Problems, Unipolar Depression, Anxiety, Unipolar Depression,  Phase Of Life Problems, Anxiety  Goals 1. Appropriately grieve the loss in order to normalize mood and to return  to previously adaptive level of functioning. 2. Balance life activities between consideration of others and development of own interests. Objective Apply problem-solving skills to current circumstances Target Date: 2022-10-15 Frequency: Biweekly Progress: 90 Modality: individual Related Interventions 1. Use modeling and role-playing with the client to apply the problem-solving approach to his/her  current circumstances (or assign "Applying Problem-Solving to Interpersonal Conflict" from  the Adult Psychotherapy Homework Planner by Bryn Gulling); encourage implementation of action  plan, reinforcing success and  redirecting for failure. Objective Implement new activities that increase a sense of  satisfaction. Target Date: 2022-10-15 Frequency: Biweekly Progress: 90 Modality: individual Related Interventions 1. Develop a plan with the client to include activities that will increase his/her satisfaction, fulfill  his/her values, and improve the quality of his/her life. 3. Develop healthy interpersonal relationships that lead to the alleviation  and help prevent the relapse of depression. Objective Learn and implement problem-solving and decision-making skills. Target Date: 2022-10-15 Frequency: Biweekly Progress: 90 Modality: individual Related Interventions 1. Encourage in the client the development of a positive problem orientation in which problems  and solving them are viewed as a natural part of life and not something to be feared, despaired,  or avoided. 2. Conduct Problem-Solving Therapy  Implement new activities that increase a sense of satisfaction. Target Date: 2022-10-15 Frequency: Biweekly Progress: 90 Modality: individual Related Interventions 1. Develop a plan with the client to include activities that will increase his/her satisfaction, fulfill  his/her values, and improve the quality of his/her life. 3. Develop healthy interpersonal relationships that lead to the alleviation  and help prevent the relapse of depression. Objective Learn and implement problem-solving and decision-making skills. Target Date: 2022-10-15 Frequency: Biweekly Progress: 90 Modality: individual Related Interventions 1. Encourage in the client the development of a positive problem orientation in which problems  and solving them are viewed as a natural part of life and not something to be feared, despaired,  or avoided.  Objective Identify and replace thoughts and beliefs that support depression. Target Date: 2022-10-15 Frequency: Biweekly Progress: 90 Modality: individual Related Interventions 1. Explore and restructure underlying assumptions and beliefs reflected in biased  self-talk that  may put the client at risk for relapse or recurrence. 2. Facilitate and reinforce the client's shift from biased depressive self-talk and beliefs to realitybased cognitive messages that enhance self-confidence and increase adaptive actions (see  "Positive Self-Talk" in the Adult Psychotherapy Homework Planner by Bryn Gulling). 4. Enhance ability to effectively cope with the full variety of life's worries  and anxieties. Objective Identify, challenge, and replace biased, fearful self-talk with positive, realistic, and empowering selftalk. Target Date: 2022-10-15 Frequency: Biweekly Progress: 80 Modality: individual Related Interventions 1. Explore the client's schema and self-talk that mediate his/her fear response; assist him/her in  challenging the biases; replace the distorted messages with reality-based alternatives and  positive, realistic self-talk that will increase his/her self-confidence in coping with irrational  fears (see Cognitive Therapy of Anxiety Disorders by Alison Stalling). Objective Learn and implement problem-solving strategies for realistically addressing worries. Target Date: 2022-10-15 Frequency: Biweekly Progress: 60 Modality: individual Therapy 5. Recognize, accept, and cope with feelings of depression. 6. Resolve conflicted feelings and adapt to the new life circumstances. 7. Stabilize anxiety level while increasing ability to function on a daily  The patient is making progress and has approved this plan of treatment.  Njeri Vicente G Inika Bellanger,  LCSW  Antuane Eastridge G Estill Llerena, LCSW               Isamar Nazir G Alvie Speltz, LCSW

## 2022-06-22 ENCOUNTER — Ambulatory Visit (INDEPENDENT_AMBULATORY_CARE_PROVIDER_SITE_OTHER): Payer: Medicare Other | Admitting: Psychology

## 2022-06-22 ENCOUNTER — Ambulatory Visit: Payer: Medicare Other | Admitting: Psychology

## 2022-06-22 DIAGNOSIS — F331 Major depressive disorder, recurrent, moderate: Secondary | ICD-10-CM

## 2022-06-22 DIAGNOSIS — F411 Generalized anxiety disorder: Secondary | ICD-10-CM

## 2022-06-22 DIAGNOSIS — F4323 Adjustment disorder with mixed anxiety and depressed mood: Secondary | ICD-10-CM | POA: Diagnosis not present

## 2022-06-22 NOTE — Progress Notes (Signed)
[facility] Counselor/Therapist Progress Note  Patient ID: Kristin Sparks, MRN: [id number],    Date: 06/22/2022  Time Spent: 60 minutes  Treatment Type: Individual Therapy  Reported Symptoms: anxiety and depression  Mental Status Exam: Appearance:  Casual     Behavior: Appropriate  Motor: Normal  Speech/Language:  Normal Rate  Affect: Blunt  Mood: pleasant  Thought process: normal  Thought content:   WNL  Sensory/Perceptual disturbances:   WNL  Orientation: oriented to person, place, time/date, and situation  Attention: Good  Concentration: Good  Memory: WNL  Fund of knowledge:  Good  Insight:   Good  Judgment:  Good  Impulse Control: Good   Risk Assessment: Danger to Self:  No Self-injurious Behavior: No Danger to Others: No Duty to Warn:no Physical Aggression / Violence:No  Access to Firearms a concern: No  Gang Involvement:No   Subjective: The patient attended a face-to-face individual therapy session via video visit.  The patient gave verbal consent to have the session on video on WebEx.  The patient was in her home alone and the therapist was in the office.  The patient reports that she just got home from another trip that she took to [facility] with her sister and some friends.  She states that she had a wonderful time and reports feeling good when she does this.  The patient seems to be doing a little bit better at making plans to do things however she still remains stuck and whether she is moving or not even though she knows she does not want to be in the house she is in.  I continue to encourage the patient to try new things and to start making some decisions about what she wants to do and where she wants to live because she does not really like where she is living now.  She is aware that if she wanted to stay where she is she could do so however she still continues to lament over whether to move and where.  The patient talked about  wanting to make plans and we talked about her committing herself to at least planning one big trip may be a year that she could do.  The patient continues to be anxious, she reports that she is having difficulty sleeping and she has a prescription for trazodone that her medication provider gave her but she has not tried that yet.  I recommended that maybe she want wants to try it slowly and take about a half 1 time.  I encouraged her if she had any problems to contact her medication provider. Interventions: Cognitive Behavioral Therapy and Assertiveness/Communication  Diagnosis:Major depressive disorder, recurrent episode, moderate (HCC)  Generalized anxiety disorder  Adjustment disorder with mixed anxiety and depressed mood  Plan:  Client Abilities/Strengths  Intelligent, caring, motivated  Client Treatment Preferences  Outpatient individual therapy  Client Statement of Needs  " I need some help to deal with my situation", "I 'm not sure if I'm depressed or what?"  Treatment Level  Outpatient Individual therapy  Symptoms  Frustration and anxiety related to providing oversight and caretaking to an aging, ailing, and dependent  parent.: No Description Entered (Status: improved). Lack of energy.: No Description Entered (Status:  improved). Motor tension (e.g., restlessness, tiredness, shakiness, muscle tension).: No Description  Entered (Status: improved). Poor concentration and indecisiveness.: No Description Entered (Status:  improved). Social withdrawal.: No Description Entered (Status: improved).  Problems Addressed  Unipolar Depression, Phase Of Life Problems, Unipolar  Depression, Anxiety, Unipolar Depression,  Phase Of Life Problems, Anxiety  Goals 1. Appropriately grieve the loss in order to normalize mood and to return  to previously adaptive level of functioning. 2. Balance life activities between consideration of others and development of own interests. Objective Apply  problem-solving skills to current circumstances Target Date: 2022-10-15 Frequency: Biweekly Progress: 90 Modality: individual Related Interventions 1. Use modeling and role-playing with the client to apply the problem-solving approach to his/her  current circumstances (or assign "Applying Problem-Solving to Interpersonal Conflict" from  the Adult Psychotherapy Homework Planner by [facility]); encourage implementation of action  plan, reinforcing success and redirecting for failure. Objective Implement new activities that increase a sense of satisfaction. Target Date: 2022-10-15 Frequency: Biweekly Progress: 90 Modality: individual Related Interventions 1. Develop a plan with the client to include activities that will increase his/her satisfaction, fulfill  his/her values, and improve the quality of his/her life. 3. Develop healthy interpersonal relationships that lead to the alleviation  and help prevent the relapse of depression. Objective Learn and implement problem-solving and decision-making skills. Target Date: 2022-10-15 Frequency: Biweekly Progress: 90 Modality: individual Related Interventions 1. Encourage in the client the development of a positive problem orientation in which problems  and solving them are viewed as a natural part of life and not something to be feared, despaired,  or avoided. 2. Conduct Problem-Solving Therapy  Implement new activities that increase a sense of satisfaction. Target Date: 2022-10-15 Frequency: Biweekly Progress: 90 Modality: individual Related Interventions 1. Develop a plan with the client to include activities that will increase his/her satisfaction, fulfill  his/her values, and improve the quality of his/her life. 3. Develop healthy interpersonal relationships that lead to the alleviation  and help prevent the relapse of depression. Objective Learn and implement problem-solving and decision-making skills. Target Date: 2022-10-15  Frequency: Biweekly Progress: 90 Modality: individual Related Interventions 1. Encourage in the client the development of a positive problem orientation in which problems  and solving them are viewed as a natural part of life and not something to be feared, despaired,  or avoided.  Objective Identify and replace thoughts and beliefs that support depression. Target Date: 2022-10-15 Frequency: Biweekly Progress: 90 Modality: individual Related Interventions 1. Explore and restructure underlying assumptions and beliefs reflected in biased self-talk that  may put the client at risk for relapse or recurrence. 2. Facilitate and reinforce the client's shift from biased depressive self-talk and beliefs to realitybased cognitive messages that enhance self-confidence and increase adaptive actions (see  "Positive Self-Talk" in the Adult Psychotherapy Homework Planner by Bryn Gulling). 4. Enhance ability to effectively cope with the full variety of life's worries  and anxieties. Objective Identify, challenge, and replace biased, fearful self-talk with positive, realistic, and empowering selftalk. Target Date: 2022-10-15 Frequency: Biweekly Progress: 80 Modality: individual Related Interventions 1. Explore the client's schema and self-talk that mediate his/her fear response; assist him/her in  challenging the biases; replace the distorted messages with reality-based alternatives and  positive, realistic self-talk that will increase his/her self-confidence in coping with irrational  fears (see Cognitive Therapy of Anxiety Disorders by Alison Stalling). Objective Learn and implement problem-solving strategies for realistically addressing worries. Target Date: 2022-10-15 Frequency: Biweekly Progress: 60 Modality: individual Therapy 5. Recognize, accept, and cope with feelings of depression. 6. Resolve conflicted feelings and adapt to the new life circumstances. 7. Stabilize anxiety level while increasing  ability to function on a daily  The patient is making progress and has approved this plan of treatment.  Ila Landowski  G Nelson Julson, LCSW

## 2022-07-06 ENCOUNTER — Ambulatory Visit (INDEPENDENT_AMBULATORY_CARE_PROVIDER_SITE_OTHER): Payer: Medicare Other | Admitting: Psychology

## 2022-07-06 DIAGNOSIS — F4323 Adjustment disorder with mixed anxiety and depressed mood: Secondary | ICD-10-CM

## 2022-07-06 DIAGNOSIS — F331 Major depressive disorder, recurrent, moderate: Secondary | ICD-10-CM | POA: Diagnosis not present

## 2022-07-06 DIAGNOSIS — F411 Generalized anxiety disorder: Secondary | ICD-10-CM | POA: Diagnosis not present

## 2022-07-07 ENCOUNTER — Encounter: Payer: Self-pay | Admitting: Psychiatry

## 2022-07-07 ENCOUNTER — Telehealth (INDEPENDENT_AMBULATORY_CARE_PROVIDER_SITE_OTHER): Payer: Medicare Other | Admitting: Psychiatry

## 2022-07-07 DIAGNOSIS — F411 Generalized anxiety disorder: Secondary | ICD-10-CM

## 2022-07-07 DIAGNOSIS — G4700 Insomnia, unspecified: Secondary | ICD-10-CM | POA: Diagnosis not present

## 2022-07-07 DIAGNOSIS — F3341 Major depressive disorder, recurrent, in partial remission: Secondary | ICD-10-CM | POA: Diagnosis not present

## 2022-07-07 MED ORDER — BUSPIRONE HCL 15 MG PO TABS: 15.0000 mg | ORAL_TABLET | Freq: Every morning | ORAL | 1 refills | Status: DC

## 2022-07-07 MED ORDER — DULOXETINE HCL 30 MG PO CPEP: 30.0000 mg | ORAL_CAPSULE | Freq: Every morning | ORAL | 1 refills | Status: DC

## 2022-07-07 NOTE — Progress Notes (Signed)
[facility] Counselor/Therapist Progress Note  Patient ID: Kristin Sparks, MRN: [id number],    Date: 07/06/2022  Time Spent: 60 minutes  Treatment Type: Individual Therapy  Reported Symptoms: anxiety and depression  Mental Status Exam: Appearance:  Casual     Behavior: Appropriate  Motor: Normal  Speech/Language:  Normal Rate  Affect: Blunt  Mood: pleasant  Thought process: normal  Thought content:   WNL  Sensory/Perceptual disturbances:   WNL  Orientation: oriented to person, place, time/date, and situation  Attention: Good  Concentration: Good  Memory: WNL  Fund of knowledge:  Good  Insight:   Good  Judgment:  Good  Impulse Control: Good   Risk Assessment: Danger to Self:  No Self-injurious Behavior: No Danger to Others: No Duty to Warn:no Physical Aggression / Violence:No  Access to Firearms a concern: No  Gang Involvement:No   Subjective: The patient attended a face-to-face individual therapy session via video visit.  The patient gave verbal consent to have the session on video on WebEx.  The patient was in her home alone and the therapist was in the office.  The patient reports that she has been doing okay and staying home for the most part over the last few weeks.  The patient talked about doing some house cleaning and yard work and it seems that she is moving more towards making a decision about what she wants to do about moving.  We talked about some of the things that options that have come up over the last couple weeks including her step daughter's husband talking about the possibility of them getting a house where she can live and also his mother and the stepdaughter and her husband.  I recommended that the patient write down absolute things that she feels like she would need in order to be happy where she was.  I asked that she do this between now and the next session.  The patient continues to enjoy traveling and is planning a trip with her  sister to [place] in the coming weeks.  The patient still is anxious about making any decisions and moving.  She definitely does not want to stay where she is however she realizes that time is passing by and she is feeling like she needs to make some kind of decision at some point.  We will continue to work with patient on making some decisions and making plans to live her life the way she wants to.  Interventions: Cognitive Behavioral Therapy and Assertiveness/Communication  Diagnosis:Major depressive disorder, recurrent episode, moderate (HCC)  Generalized anxiety disorder  Adjustment disorder with mixed anxiety and depressed mood  Plan:  Client Abilities/Strengths  Intelligent, caring, motivated  Client Treatment Preferences  Outpatient individual therapy  Client Statement of Needs  " I need some help to deal with my situation", "I 'm not sure if I'm depressed or what?"  Treatment Level  Outpatient Individual therapy  Symptoms  Frustration and anxiety related to providing oversight and caretaking to an aging, ailing, and dependent  parent.: No Description Entered (Status: improved). Lack of energy.: No Description Entered (Status:  improved). Motor tension (e.g., restlessness, tiredness, shakiness, muscle tension).: No Description  Entered (Status: improved). Poor concentration and indecisiveness.: No Description Entered (Status:  improved). Social withdrawal.: No Description Entered (Status: improved).  Problems Addressed  Unipolar Depression, Phase Of Life Problems, Unipolar Depression, Anxiety, Unipolar Depression,  Phase Of Life Problems, Anxiety  Goals 1. Appropriately grieve the loss in order to normalize mood  and to return  to previously adaptive level of functioning. 2. Balance life activities between consideration of others and development of own interests. Objective Apply problem-solving skills to current circumstances Target Date: 2022-10-15 Frequency:  Biweekly Progress: 90 Modality: individual Related Interventions 1. Use modeling and role-playing with the client to apply the problem-solving approach to his/her  current circumstances (or assign "Applying Problem-Solving to Interpersonal Conflict" from  the Adult Psychotherapy Homework Planner by [facility]); encourage implementation of action  plan, reinforcing success and redirecting for failure. Objective Implement new activities that increase a sense of satisfaction. Target Date: 2022-10-15 Frequency: Biweekly Progress: 90 Modality: individual Related Interventions 1. Develop a plan with the client to include activities that will increase his/her satisfaction, fulfill  his/her values, and improve the quality of his/her life. 3. Develop healthy interpersonal relationships that lead to the alleviation  and help prevent the relapse of depression. Objective Learn and implement problem-solving and decision-making skills. Target Date: 2022-10-15 Frequency: Biweekly Progress: 90 Modality: individual Related Interventions 1. Encourage in the client the development of a positive problem orientation in which problems  and solving them are viewed as a natural part of life and not something to be feared, despaired,  or avoided. 2. Conduct Problem-Solving Therapy  Implement new activities that increase a sense of satisfaction. Target Date: 2022-10-15 Frequency: Biweekly Progress: 90 Modality: individual Related Interventions 1. Develop a plan with the client to include activities that will increase his/her satisfaction, fulfill  his/her values, and improve the quality of his/her life. 3. Develop healthy interpersonal relationships that lead to the alleviation  and help prevent the relapse of depression. Objective Learn and implement problem-solving and decision-making skills. Target Date: 2022-10-15 Frequency: Biweekly Progress: 90 Modality: individual Related Interventions 1. Encourage  in the client the development of a positive problem orientation in which problems  and solving them are viewed as a natural part of life and not something to be feared, despaired,  or avoided.  Objective Identify and replace thoughts and beliefs that support depression. Target Date: 2022-10-15 Frequency: Biweekly Progress: 90 Modality: individual Related Interventions 1. Explore and restructure underlying assumptions and beliefs reflected in biased self-talk that  may put the client at risk for relapse or recurrence. 2. Facilitate and reinforce the client's shift from biased depressive self-talk and beliefs to realitybased cognitive messages that enhance self-confidence and increase adaptive actions (see  "Positive Self-Talk" in the Adult Psychotherapy Homework Planner by Bryn Gulling). 4. Enhance ability to effectively cope with the full variety of life's worries  and anxieties. Objective Identify, challenge, and replace biased, fearful self-talk with positive, realistic, and empowering selftalk. Target Date: 2022-10-15 Frequency: Biweekly Progress: 80 Modality: individual Related Interventions 1. Explore the client's schema and self-talk that mediate his/her fear response; assist him/her in  challenging the biases; replace the distorted messages with reality-based alternatives and  positive, realistic self-talk that will increase his/her self-confidence in coping with irrational  fears (see Cognitive Therapy of Anxiety Disorders by Alison Stalling). Objective Learn and implement problem-solving strategies for realistically addressing worries. Target Date: 2022-10-15 Frequency: Biweekly Progress: 60 Modality: individual Therapy 5. Recognize, accept, and cope with feelings of depression. 6. Resolve conflicted feelings and adapt to the new life circumstances. 7. Stabilize anxiety level while increasing ability to function on a daily  The patient is making progress and has approved this plan  of treatment.  Vann Okerlund G Franki Stemen, LCSW

## 2022-07-07 NOTE — Progress Notes (Signed)
Kristin Sparks [id number] 1956/01/20 66 y.o.  Virtual Visit via Video Note  I connected with pt @ on 07/07/22 at 10:00 AM EDT by a video enabled telemedicine application and verified that I am speaking with the correct person using two identifiers.   I discussed the limitations of evaluation and management by telemedicine and the availability of in person appointments. The patient expressed understanding and agreed to proceed.  I discussed the assessment and treatment plan with the patient. The patient was provided an opportunity to ask questions and all were answered. The patient agreed with the plan and demonstrated an understanding of the instructions.   The patient was advised to call back or seek an in-person evaluation if the symptoms worsen or if the condition fails to improve as anticipated.  I provided 30 minutes of non-face-to-face time during this encounter.  The patient was located at home.  The provider was located at [place].   Thayer Headings, PMHNP   Subjective:   Patient ID:  Kristin Sparks is a 66 y.o. (DOB [birth year]) female.  Chief Complaint:  Chief Complaint  Patient presents with   Follow-up    Depression, anxiety, and insomnia    HPI Kristin Sparks presents for follow-up of anxiety, depression, and insomnia. She reports that she lowered some of her medications on her own and did this gradually. She reports that she has stopped Wellbutrin. She then started reducing Duloxetine from 60 mg to 40 mg for one month and then to 30 mg. She then reduced Buspar. "I haven't had any side effects." Currently taking Cymbalta 30 mg in the morning and taking Buspar 15 mg once daily. She reports that she has not taken Trazodone.   Denies depressed mood. "I'm feeling ok." She reports that her mood is normally better in the summer and more likely to have depression in the fall and winter. She denies any worsening anxiety. She reports that she used to wake up feeling  tired, even after adequate sleep. She reports that she is no longer feeling as tired during the day since she reduced Cymbalta. Energy and motivation have been ok. She reports that she is sleeping ok overall with some nights of interrupted sleep. She reports poor sleep about twice a week. Appetite is unchanged. No significant change in concentration. Denies SI.   Husband died in 02/08/2020.   She has been reducing her running some since this seemed to take her energy away and has been trying different types of exercises.   Son and daughter-in-law are in [place], [place]. Sister is in [place].   Past medication trials: Sertraline-diarrhea.  Felt calmer. Lexapro-prescribed for hot flashes.  Had increased anxiety while taking. Effexor XR-some benefit and then was less effective.  Signs and symptoms inadequately controlled at 75 mg and unable to tolerate higher doses. Cymbalta Buspar Propanolol Ativan Hydroxyzine Lyrica Gabapentin-did not help with neuropathy    Review of Systems:  Review of Systems  Constitutional:        Hot flashes have decreased  Musculoskeletal:  Negative for gait problem.  Neurological:        Notices some mild neuropathy with decrease in Cymbalta  Psychiatric/Behavioral:         Please refer to HPI    Medications: I have reviewed the patient's current medications.  Current Outpatient Medications  Medication Sig Dispense Refill   calcium-vitamin D (OSCAL-500) 500-400 MG-UNIT tablet Take by mouth.     levocetirizine (XYZAL) 5 MG tablet Take 5  mg by mouth daily as needed.      lovastatin (MEVACOR) 20 MG tablet Take 20 mg by mouth once a week.      meloxicam (MOBIC) 7.5 MG tablet Take 7.5 mg by mouth as needed.     Omega-3 1000 MG CAPS Take by mouth.     albuterol (PROVENTIL HFA;VENTOLIN HFA) 108 (90 Base) MCG/ACT inhaler Inhale into the lungs.     busPIRone (BUSPAR) 15 MG tablet Take 1 tablet (15 mg total) by mouth every morning. 90 tablet 1   DULoxetine  (CYMBALTA) 30 MG capsule Take 1 capsule (30 mg total) by mouth every morning. 90 capsule 1   Fluticasone-Salmeterol (ADVAIR DISKUS) 250-50 MCG/DOSE AEPB Inhale into the lungs.     hydrOXYzine (ATARAX/VISTARIL) 10 MG tablet Take 1 tablet (10 mg total) by mouth at bedtime as needed for up to 30 days. (Patient not taking: Reported on 11/11/2021) 30 tablet 2   LORazepam (ATIVAN) 0.5 MG tablet Take 1/2-1 tab po q 6 hours as needed for anxiety (Patient not taking: Reported on 07/07/2022) 15 tablet 0   pregabalin (LYRICA) 75 MG capsule Take 1 capsule (75 mg total) by mouth every evening. (Patient not taking: Reported on 07/07/2022) 90 capsule 1   propranolol (INDERAL) 10 MG tablet Take 1/2-1 tab po BID prn anxiety (Do not use if pulse is <70) (Patient not taking: Reported on 07/07/2022) 60 tablet 1   traZODone (DESYREL) 50 MG tablet Take 1/2-2 tabs po QHS prn insomnia (Patient not taking: Reported on 07/07/2022) 60 tablet 2   No current facility-administered medications for this visit.    Medication Side Effects: None  Allergies:  Allergies  Allergen Reactions   Latex    Sulfa Antibiotics Rash    Past Medical History:  Diagnosis Date   Allergic rhinitis    Asthma    Elevated cholesterol    Neuropathy     Family History  Problem Relation Age of Onset   Alcohol abuse Father    Depression Sister    Breast cancer Maternal Grandmother     Social History   Socioeconomic History   Marital status: Widowed    Spouse name: Not on file   Number of children: Not on file   Years of education: Not on file   Highest education level: Not on file  Occupational History   Not on file  Tobacco Use   Smoking status: Never   Smokeless tobacco: Never  Substance and Sexual Activity   Alcohol use: Not on file   Drug use: Not on file   Sexual activity: Not on file  Other Topics Concern   Not on file  Social History Narrative   Not on file   Social Determinants of Health   Financial Resource  Strain: Not on file  Food Insecurity: Not on file  Transportation Needs: Not on file  Physical Activity: Not on file  Stress: Not on file  Social Connections: Not on file  Intimate Partner Violence: Not on file    Past Medical History, Surgical history, Social history, and Family history were reviewed and updated as appropriate.   Please see review of systems for further details on the patient's review from today.   Objective:   Physical Exam:  There were no vitals taken for this visit.  Physical Exam Neurological:     Mental Status: She is alert and oriented to person, place, and time.     Cranial Nerves: No dysarthria.  Psychiatric:  Attention and Perception: Attention and perception normal.        Mood and Affect: Mood normal.        Speech: Speech normal.        Behavior: Behavior is cooperative.        Thought Content: Thought content normal. Thought content is not paranoid or delusional. Thought content does not include homicidal or suicidal ideation. Thought content does not include homicidal or suicidal plan.        Cognition and Memory: Cognition and memory normal.        Judgment: Judgment normal.     Comments: Insight intact     Lab Review:  No results found for: "NA", "K", "CL", "CO2", "GLUCOSE", "BUN", "CREATININE", "CALCIUM", "PROT", "ALBUMIN", "AST", "ALT", "ALKPHOS", "BILITOT", "GFRNONAA", "GFRAA"  No results found for: "WBC", "RBC", "HGB", "HCT", "PLT", "MCV", "MCH", "MCHC", "RDW", "LYMPHSABS", "MONOABS", "EOSABS", "BASOSABS"  No results found for: "POCLITH", "LITHIUM"   No results found for: "PHENYTOIN", "PHENOBARB", "VALPROATE", "CBMZ"   .res Assessment: Plan:    Pt seen for 30 minutes and time spent discussing treatment plan and response to her lowering medications. Recommended continuing current medications since she reports history of seasonal depression and stopping medication at this time may increase risk of recurrence of depression. Pt is  in agreement with this plan.  Also discussed using Trazodone prn insomnia and reviewed potential benefits, risks, and side effects of Trazodone. Pt agrees to start trial of Trazodone 50 mg 1/2-2 tabs po QHS prn insomnia.  Continue Cymbalta 30 mg po qd for anxiety and depression.  Continue Buspar 15 mg po qd for anxiety.  Recommend continuing therapy with Bambi Cottle, LCSW.  Pt to follow-up in 3 months or sooner if clinically indicated.  Patient advised to contact office with any questions, adverse effects, or acute worsening in signs and symptoms.   Sharmayne was seen today for follow-up.  Diagnoses and all orders for this visit:  Generalized anxiety disorder -     DULoxetine (CYMBALTA) 30 MG capsule; Take 1 capsule (30 mg total) by mouth every morning. -     busPIRone (BUSPAR) 15 MG tablet; Take 1 tablet (15 mg total) by mouth every morning.  Depression, major, recurrent, in partial remission (HCC) -     DULoxetine (CYMBALTA) 30 MG capsule; Take 1 capsule (30 mg total) by mouth every morning.  Insomnia, unspecified type     Please see After Visit Summary for patient specific instructions.  Future Appointments  Date Time Provider [place]  07/20/2022  9:00 AM Cottle, Lucious Groves, LCSW [facility] None  08/03/2022  9:00 AM Cottle, Bambi G, LCSW [facility] None  08/17/2022  9:00 AM Cottle, Bambi G, LCSW [facility] None  08/31/2022  9:00 AM Cottle, Bambi G, LCSW [facility] None  09/14/2022  9:00 AM Cottle, Bambi G, LCSW [facility] None    No orders of the defined types were placed in this encounter.     -------------------------------

## 2022-07-20 ENCOUNTER — Ambulatory Visit (INDEPENDENT_AMBULATORY_CARE_PROVIDER_SITE_OTHER): Payer: Medicare Other | Admitting: Psychology

## 2022-07-20 DIAGNOSIS — F411 Generalized anxiety disorder: Secondary | ICD-10-CM | POA: Diagnosis not present

## 2022-07-20 DIAGNOSIS — F4323 Adjustment disorder with mixed anxiety and depressed mood: Secondary | ICD-10-CM

## 2022-07-20 DIAGNOSIS — F3341 Major depressive disorder, recurrent, in partial remission: Secondary | ICD-10-CM | POA: Diagnosis not present

## 2022-07-20 NOTE — Progress Notes (Signed)
[place] Counselor/Therapist Progress Note  Patient ID: Kristin Sparks, MRN: [id number],    Date: 07/20/2022  Time Spent: 60 minutes  Treatment Type: Individual Therapy  Reported Symptoms: anxiety and depression  Mental Status Exam: Appearance:  Casual     Behavior: Appropriate  Motor: Normal  Speech/Language:  Normal Rate  Affect: Blunt  Mood: pleasant  Thought process: normal  Thought content:   WNL  Sensory/Perceptual disturbances:   WNL  Orientation: oriented to person, place, time/date, and situation  Attention: Good  Concentration: Good  Memory: WNL  Fund of knowledge:  Good  Insight:   Good  Judgment:  Good  Impulse Control: Good   Risk Assessment: Danger to Self:  No Self-injurious Behavior: No Danger to Others: No Duty to Warn:no Physical Aggression / Violence:No  Access to Firearms a concern: No  Gang Involvement:No   Subjective: The patient attended a face-to-face individual therapy session via video visit.  The patient gave verbal consent to have the session on video on WebEx.  The patient was in her sisters home alone and the therapist was in the office.  The patient presents as pleasant and cooperative.  The patient states that she is at her sister's house and they are going to [facility] tomorrow.  We talked about how good her life is.  We talked about her being able to do what ever she wants to do in her having the ability and money to do it with.  The patient does admit that her life is much better and she seems to be less stressed today than she has been in the past.  We talked about her doing several trips recently and it seems that she is making a couple of trips a month.  I think the patient is making some progress and she seems to be doing much better with her circumstance.  She still needs to look at what she wants to do with her living situation but right now she seems to be enjoying traveling and doing more things than she has in a  long time.  We will continue to work with patient on determining what it is that she wants to do and we may talk about spreading appointments out in the near future.  Interventions: Cognitive Behavioral Therapy and Assertiveness/Communication  Diagnosis:Generalized anxiety disorder  Depression, major, recurrent, in partial remission (HCC)  Adjustment disorder with mixed anxiety and depressed mood  Plan:  Client Abilities/Strengths  Intelligent, caring, motivated  Client Treatment Preferences  Outpatient individual therapy  Client Statement of Needs  " I need some help to deal with my situation", "I 'm not sure if I'm depressed or what?"  Treatment Level  Outpatient Individual therapy  Symptoms  Frustration and anxiety related to providing oversight and caretaking to an aging, ailing, and dependent  parent.: No Description Entered (Status: improved). Lack of energy.: No Description Entered (Status:  improved). Motor tension (e.g., restlessness, tiredness, shakiness, muscle tension).: No Description  Entered (Status: improved). Poor concentration and indecisiveness.: No Description Entered (Status:  improved). Social withdrawal.: No Description Entered (Status: improved).  Problems Addressed  Unipolar Depression, Phase Of Life Problems, Unipolar Depression, Anxiety, Unipolar Depression,  Phase Of Life Problems, Anxiety  Goals 1. Appropriately grieve the loss in order to normalize mood and to return  to previously adaptive level of functioning. 2. Balance life activities between consideration of others and development of own interests. Objective Apply problem-solving skills to current circumstances Target Date: 2022-10-15 Frequency: Biweekly  Progress: 90 Modality: individual Related Interventions 1. Use modeling and role-playing with the client to apply the problem-solving approach to his/her  current circumstances (or assign "Applying Problem-Solving to Interpersonal Conflict"  from  the Adult Psychotherapy Homework Planner by [facility]); encourage implementation of action  plan, reinforcing success and redirecting for failure. Objective Implement new activities that increase a sense of satisfaction. Target Date: 2022-10-15 Frequency: Biweekly Progress: 90 Modality: individual Related Interventions 1. Develop a plan with the client to include activities that will increase his/her satisfaction, fulfill  his/her values, and improve the quality of his/her life. 3. Develop healthy interpersonal relationships that lead to the alleviation  and help prevent the relapse of depression. Objective Learn and implement problem-solving and decision-making skills. Target Date: 2022-10-15 Frequency: Biweekly Progress: 90 Modality: individual Related Interventions 1. Encourage in the client the development of a positive problem orientation in which problems  and solving them are viewed as a natural part of life and not something to be feared, despaired,  or avoided. 2. Conduct Problem-Solving Therapy  Implement new activities that increase a sense of satisfaction. Target Date: 2022-10-15 Frequency: Biweekly Progress: 90 Modality: individual Related Interventions 1. Develop a plan with the client to include activities that will increase his/her satisfaction, fulfill  his/her values, and improve the quality of his/her life. 3. Develop healthy interpersonal relationships that lead to the alleviation  and help prevent the relapse of depression. Objective Learn and implement problem-solving and decision-making skills. Target Date: 2022-10-15 Frequency: Biweekly Progress: 90 Modality: individual Related Interventions 1. Encourage in the client the development of a positive problem orientation in which problems  and solving them are viewed as a natural part of life and not something to be feared, despaired,  or avoided.  Objective Identify and replace thoughts and beliefs that  support depression. Target Date: 2022-10-15 Frequency: Biweekly Progress: 90 Modality: individual Related Interventions 1. Explore and restructure underlying assumptions and beliefs reflected in biased self-talk that  may put the client at risk for relapse or recurrence. 2. Facilitate and reinforce the client's shift from biased depressive self-talk and beliefs to realitybased cognitive messages that enhance self-confidence and increase adaptive actions (see  "Positive Self-Talk" in the Adult Psychotherapy Homework Planner by Bryn Gulling). 4. Enhance ability to effectively cope with the full variety of life's worries  and anxieties. Objective Identify, challenge, and replace biased, fearful self-talk with positive, realistic, and empowering selftalk. Target Date: 2022-10-15 Frequency: Biweekly Progress: 80 Modality: individual Related Interventions 1. Explore the client's schema and self-talk that mediate his/her fear response; assist him/her in  challenging the biases; replace the distorted messages with reality-based alternatives and  positive, realistic self-talk that will increase his/her self-confidence in coping with irrational  fears (see Cognitive Therapy of Anxiety Disorders by Alison Stalling). Objective Learn and implement problem-solving strategies for realistically addressing worries. Target Date: 2022-10-15 Frequency: Biweekly Progress: 60 Modality: individual Therapy 5. Recognize, accept, and cope with feelings of depression. 6. Resolve conflicted feelings and adapt to the new life circumstances. 7. Stabilize anxiety level while increasing ability to function on a daily  The patient is making progress and has approved this plan of treatment.  Taytum Scheck G Nashali Ditmer, LCSW

## 2022-08-03 ENCOUNTER — Ambulatory Visit (INDEPENDENT_AMBULATORY_CARE_PROVIDER_SITE_OTHER): Payer: Medicare Other | Admitting: Psychology

## 2022-08-03 DIAGNOSIS — F411 Generalized anxiety disorder: Secondary | ICD-10-CM | POA: Diagnosis not present

## 2022-08-03 DIAGNOSIS — F3341 Major depressive disorder, recurrent, in partial remission: Secondary | ICD-10-CM

## 2022-08-03 DIAGNOSIS — F4323 Adjustment disorder with mixed anxiety and depressed mood: Secondary | ICD-10-CM

## 2022-08-03 NOTE — Progress Notes (Signed)
[place] Counselor/Therapist Progress Note  Patient ID: Kristin Sparks, MRN: [id number],    Date: 11.03/2022  Time Spent: 60 minutes  Treatment Type: Individual Therapy  Reported Symptoms: anxiety and depression  Mental Status Exam: Appearance:  Casual     Behavior: Appropriate  Motor: Normal  Speech/Language:  Normal Rate  Affect: Blunt  Mood: pleasant  Thought process: normal  Thought content:   WNL  Sensory/Perceptual disturbances:   WNL  Orientation: oriented to person, place, time/date, and situation  Attention: Good  Concentration: Good  Memory: WNL  Fund of knowledge:  Good  Insight:   Good  Judgment:  Good  Impulse Control: Good   Risk Assessment: Danger to Self:  No Self-injurious Behavior: No Danger to Others: No Duty to Warn:no Physical Aggression / Violence:No  Access to Firearms a concern: No  Gang Involvement:No   Subjective: The patient attended a face-to-face individual therapy session via video visit.  The patient gave verbal consent to have the session on video on WebEx.  The patient was in her sisters home alone and the therapist was in the office.  The patient presents as pleasant and cooperative.  The patient reports that she has had Luna for 2 weeks.  Wynonia Musty is her son's dog and she gets him to her for long periods of time and really does not want to do so.  I encouraged the patient to set some limits with her son and have him keep Luna  at his house.  We also talked about her desire to travel and do things and we talked about her limiting herself because she is waiting for someone else to go with her and her not go on her own.  We talked about her fear of doing this by herself.  And I encouraged her to step out of her comfort zone and make arrangements somewhere safe where she felt like she would be okay.  We will continue to push the patient to either be content where she is or find a way to do things on her own to make her  happy.  Interventions: Cognitive Behavioral Therapy and Assertiveness/Communication  Diagnosis:Generalized anxiety disorder  Depression, major, recurrent, in partial remission (HCC)  Adjustment disorder with mixed anxiety and depressed mood  Plan:  Client Abilities/Strengths  Intelligent, caring, motivated  Client Treatment Preferences  Outpatient individual therapy  Client Statement of Needs  " I need some help to deal with my situation", "I 'm not sure if I'm depressed or what?"  Treatment Level  Outpatient Individual therapy  Symptoms  Frustration and anxiety related to providing oversight and caretaking to an aging, ailing, and dependent  parent.: No Description Entered (Status: improved). Lack of energy.: No Description Entered (Status:  improved). Motor tension (e.g., restlessness, tiredness, shakiness, muscle tension).: No Description  Entered (Status: improved). Poor concentration and indecisiveness.: No Description Entered (Status:  improved). Social withdrawal.: No Description Entered (Status: improved).  Problems Addressed  Unipolar Depression, Phase Of Life Problems, Unipolar Depression, Anxiety, Unipolar Depression,  Phase Of Life Problems, Anxiety  Goals 1. Appropriately grieve the loss in order to normalize mood and to return  to previously adaptive level of functioning. 2. Balance life activities between consideration of others and development of own interests. Objective Apply problem-solving skills to current circumstances Target Date: 2022-10-15 Frequency: Biweekly Progress: 90 Modality: individual Related Interventions 1. Use modeling and role-playing with the client to apply the problem-solving approach to his/her  current circumstances (or assign "Applying  Problem-Solving to Interpersonal Conflict" from  the Adult Psychotherapy Homework Planner by [facility]); encourage implementation of action  plan, reinforcing success and redirecting for  failure. Objective Implement new activities that increase a sense of satisfaction. Target Date: 2022-10-15 Frequency: Biweekly Progress: 90 Modality: individual Related Interventions 1. Develop a plan with the client to include activities that will increase his/her satisfaction, fulfill  his/her values, and improve the quality of his/her life. 3. Develop healthy interpersonal relationships that lead to the alleviation  and help prevent the relapse of depression. Objective Learn and implement problem-solving and decision-making skills. Target Date: 2022-10-15 Frequency: Biweekly Progress: 90 Modality: individual Related Interventions 1. Encourage in the client the development of a positive problem orientation in which problems  and solving them are viewed as a natural part of life and not something to be feared, despaired,  or avoided. 2. Conduct Problem-Solving Therapy  Implement new activities that increase a sense of satisfaction. Target Date: 2022-10-15 Frequency: Biweekly Progress: 90 Modality: individual Related Interventions 1. Develop a plan with the client to include activities that will increase his/her satisfaction, fulfill  his/her values, and improve the quality of his/her life. 3. Develop healthy interpersonal relationships that lead to the alleviation  and help prevent the relapse of depression. Objective Learn and implement problem-solving and decision-making skills. Target Date: 2022-10-15 Frequency: Biweekly Progress: 90 Modality: individual Related Interventions 1. Encourage in the client the development of a positive problem orientation in which problems  and solving them are viewed as a natural part of life and not something to be feared, despaired,  or avoided.  Objective Identify and replace thoughts and beliefs that support depression. Target Date: 2022-10-15 Frequency: Biweekly Progress: 90 Modality: individual Related Interventions 1. Explore and  restructure underlying assumptions and beliefs reflected in biased self-talk that  may put the client at risk for relapse or recurrence. 2. Facilitate and reinforce the client's shift from biased depressive self-talk and beliefs to realitybased cognitive messages that enhance self-confidence and increase adaptive actions (see  "Positive Self-Talk" in the Adult Psychotherapy Homework Planner by Bryn Gulling). 4. Enhance ability to effectively cope with the full variety of life's worries  and anxieties. Objective Identify, challenge, and replace biased, fearful self-talk with positive, realistic, and empowering selftalk. Target Date: 2022-10-15 Frequency: Biweekly Progress: 80 Modality: individual Related Interventions 1. Explore the client's schema and self-talk that mediate his/her fear response; assist him/her in  challenging the biases; replace the distorted messages with reality-based alternatives and  positive, realistic self-talk that will increase his/her self-confidence in coping with irrational  fears (see Cognitive Therapy of Anxiety Disorders by Alison Stalling). Objective Learn and implement problem-solving strategies for realistically addressing worries. Target Date: 2022-10-15 Frequency: Biweekly Progress: 60 Modality: individual Therapy 5. Recognize, accept, and cope with feelings of depression. 6. Resolve conflicted feelings and adapt to the new life circumstances. 7. Stabilize anxiety level while increasing ability to function on a daily  The patient is making progress and has approved this plan of treatment.  Jaleiyah Alas G Meldrick Buttery, LCSW

## 2022-08-17 ENCOUNTER — Ambulatory Visit (INDEPENDENT_AMBULATORY_CARE_PROVIDER_SITE_OTHER): Payer: Medicare Other | Admitting: Psychology

## 2022-08-17 DIAGNOSIS — F3341 Major depressive disorder, recurrent, in partial remission: Secondary | ICD-10-CM

## 2022-08-17 DIAGNOSIS — F411 Generalized anxiety disorder: Secondary | ICD-10-CM | POA: Diagnosis not present

## 2022-08-17 DIAGNOSIS — F4323 Adjustment disorder with mixed anxiety and depressed mood: Secondary | ICD-10-CM | POA: Diagnosis not present

## 2022-08-18 NOTE — Progress Notes (Signed)
[place] Counselor/Therapist Progress Note  Patient ID: Kristin Sparks, MRN: [id number],    Date: 08/17/2022  Time Spent: 60 minutes  Treatment Type: Individual Therapy  Reported Symptoms: anxiety and depression  Mental Status Exam: Appearance:  Casual     Behavior: Appropriate  Motor: Normal  Speech/Language:  Normal Rate  Affect: Blunt  Mood: pleasant  Thought process: normal  Thought content:   WNL  Sensory/Perceptual disturbances:   WNL  Orientation: oriented to person, place, time/date, and situation  Attention: Good  Concentration: Good  Memory: WNL  Fund of knowledge:  Good  Insight:   Good  Judgment:  Good  Impulse Control: Good   Risk Assessment: Danger to Self:  No Self-injurious Behavior: No Danger to Others: No Duty to Warn:no Physical Aggression / Violence:No  Access to Firearms a concern: No  Gang Involvement:No   Subjective: The patient attended a face-to-face individual therapy session via video visit.  The patient gave verbal consent to have the session on video on WebEx.  The patient was in her home alone and the therapist was in the office.  The patient states that she is going to the beach in a few weeks.  The patient talked about dealing with her brother.  She seems much better able to set limits with him and this is progress for her.  We talked about her possibly going to the beach to stay for January.  In addition we talked about her possibly going to once a month. Interventions: Cognitive Behavioral Therapy and Assertiveness/Communication  Diagnosis:Generalized anxiety disorder  Depression, major, recurrent, in partial remission (HCC)  Adjustment disorder with mixed anxiety and depressed mood  Plan:  Client Abilities/Strengths  Intelligent, caring, motivated  Client Treatment Preferences  Outpatient individual therapy  Client Statement of Needs  " I need some help to deal with my situation", "I 'm not sure if I'm  depressed or what?"  Treatment Level  Outpatient Individual therapy  Symptoms  Frustration and anxiety related to providing oversight and caretaking to an aging, ailing, and dependent  parent.: No Description Entered (Status: improved). Lack of energy.: No Description Entered (Status:  improved). Motor tension (e.g., restlessness, tiredness, shakiness, muscle tension).: No Description  Entered (Status: improved). Poor concentration and indecisiveness.: No Description Entered (Status:  improved). Social withdrawal.: No Description Entered (Status: improved).  Problems Addressed  Unipolar Depression, Phase Of Life Problems, Unipolar Depression, Anxiety, Unipolar Depression,  Phase Of Life Problems, Anxiety  Goals 1. Appropriately grieve the loss in order to normalize mood and to return  to previously adaptive level of functioning. 2. Balance life activities between consideration of others and development of own interests. Objective Apply problem-solving skills to current circumstances Target Date: 2022-10-15 Frequency: Biweekly Progress: 90 Modality: individual Related Interventions 1. Use modeling and role-playing with the client to apply the problem-solving approach to his/her  current circumstances (or assign "Applying Problem-Solving to Interpersonal Conflict" from  the Adult Psychotherapy Homework Planner by [facility]); encourage implementation of action  plan, reinforcing success and redirecting for failure. Objective Implement new activities that increase a sense of satisfaction. Target Date: 2022-10-15 Frequency: Biweekly Progress: 90 Modality: individual Related Interventions 1. Develop a plan with the client to include activities that will increase his/her satisfaction, fulfill  his/her values, and improve the quality of his/her life. 3. Develop healthy interpersonal relationships that lead to the alleviation  and help prevent the relapse of depression. Objective Learn and  implement problem-solving and decision-making skills. Target Date: 2022-10-15 Frequency: Biweekly  Progress: 90 Modality: individual Related Interventions 1. Encourage in the client the development of a positive problem orientation in which problems  and solving them are viewed as a natural part of life and not something to be feared, despaired,  or avoided. 2. Conduct Problem-Solving Therapy  Implement new activities that increase a sense of satisfaction. Target Date: 2022-10-15 Frequency: Biweekly Progress: 90 Modality: individual Related Interventions 1. Develop a plan with the client to include activities that will increase his/her satisfaction, fulfill  his/her values, and improve the quality of his/her life. 3. Develop healthy interpersonal relationships that lead to the alleviation  and help prevent the relapse of depression. Objective Learn and implement problem-solving and decision-making skills. Target Date: 2022-10-15 Frequency: Biweekly Progress: 90 Modality: individual Related Interventions 1. Encourage in the client the development of a positive problem orientation in which problems  and solving them are viewed as a natural part of life and not something to be feared, despaired,  or avoided.  Objective Identify and replace thoughts and beliefs that support depression. Target Date: 2022-10-15 Frequency: Biweekly Progress: 90 Modality: individual Related Interventions 1. Explore and restructure underlying assumptions and beliefs reflected in biased self-talk that  may put the client at risk for relapse or recurrence. 2. Facilitate and reinforce the client's shift from biased depressive self-talk and beliefs to realitybased cognitive messages that enhance self-confidence and increase adaptive actions (see  "Positive Self-Talk" in the Adult Psychotherapy Homework Planner by Bryn Gulling). 4. Enhance ability to effectively cope with the full variety of life's worries  and  anxieties. Objective Identify, challenge, and replace biased, fearful self-talk with positive, realistic, and empowering selftalk. Target Date: 2022-10-15 Frequency: Biweekly Progress: 80 Modality: individual Related Interventions 1. Explore the client's schema and self-talk that mediate his/her fear response; assist him/her in  challenging the biases; replace the distorted messages with reality-based alternatives and  positive, realistic self-talk that will increase his/her self-confidence in coping with irrational  fears (see Cognitive Therapy of Anxiety Disorders by Alison Stalling). Objective Learn and implement problem-solving strategies for realistically addressing worries. Target Date: 2022-10-15 Frequency: Biweekly Progress: 60 Modality: individual Therapy 5. Recognize, accept, and cope with feelings of depression. 6. Resolve conflicted feelings and adapt to the new life circumstances. 7. Stabilize anxiety level while increasing ability to function on a daily  The patient is making progress and has approved this plan of treatment.  Arek Spadafore G Preslie Depasquale, LCSW

## 2022-08-31 ENCOUNTER — Ambulatory Visit: Payer: Medicare Other | Admitting: Psychology

## 2022-09-14 ENCOUNTER — Ambulatory Visit (INDEPENDENT_AMBULATORY_CARE_PROVIDER_SITE_OTHER): Payer: Medicare Other | Admitting: Psychology

## 2022-09-14 DIAGNOSIS — F411 Generalized anxiety disorder: Secondary | ICD-10-CM

## 2022-09-14 DIAGNOSIS — F4323 Adjustment disorder with mixed anxiety and depressed mood: Secondary | ICD-10-CM | POA: Diagnosis not present

## 2022-09-14 DIAGNOSIS — F3341 Major depressive disorder, recurrent, in partial remission: Secondary | ICD-10-CM

## 2022-09-15 NOTE — Progress Notes (Signed)
[facility] Counselor/Therapist Progress Note  Patient ID: Kristin Sparks, MRN: [id number],    Date: 09/14/2022  Time Spent: 60 minutes  Treatment Type: Individual Therapy  Reported Symptoms: anxiety and depression  Mental Status Exam: Appearance:  Casual     Behavior: Appropriate  Motor: Normal  Speech/Language:  Normal Rate  Affect: Blunt  Mood: pleasant  Thought process: normal  Thought content:   WNL  Sensory/Perceptual disturbances:   WNL  Orientation: oriented to person, place, time/date, and situation  Attention: Good  Concentration: Good  Memory: WNL  Fund of knowledge:  Good  Insight:   Good  Judgment:  Good  Impulse Control: Good   Risk Assessment: Danger to Self:  No Self-injurious Behavior: No Danger to Others: No Duty to Warn:no Physical Aggression / Violence:No  Access to Firearms a concern: No  Gang Involvement:No   Subjective: The patient attended a face-to-face individual therapy session via video visit.  The patient gave verbal consent to have the session on video on WebEx.  The patient was in her home alone and the therapist was in the office.  The patient states that she just got home from the beach not long ago.  She states that she and her sister had a good time.  The patient reports that she still has not done anything about trying to go to the beach and stay for a while or scheduling any other trips.  We talked about this being something that she wants to do but she seems to be not doing it out of fear.  We discussed different ways that she could travel without being in a dangerous situation.  I encouraged her to look up different tours that she could do with a group.  I encouraged her to do this between now and our next session.  Interventions: Cognitive Behavioral Therapy and Assertiveness/Communication  Diagnosis:Generalized anxiety disorder  Depression, major, recurrent, in partial remission (HCC)  Adjustment disorder with  mixed anxiety and depressed mood  Plan:  Client Abilities/Strengths  Intelligent, caring, motivated  Client Treatment Preferences  Outpatient individual therapy  Client Statement of Needs  " I need some help to deal with my situation", "I 'm not sure if I'm depressed or what?"  Treatment Level  Outpatient Individual therapy  Symptoms  Frustration and anxiety related to providing oversight and caretaking to an aging, ailing, and dependent  parent.: No Description Entered (Status: improved). Lack of energy.: No Description Entered (Status:  improved). Motor tension (e.g., restlessness, tiredness, shakiness, muscle tension).: No Description  Entered (Status: improved). Poor concentration and indecisiveness.: No Description Entered (Status:  improved). Social withdrawal.: No Description Entered (Status: improved).  Problems Addressed  Unipolar Depression, Phase Of Life Problems, Unipolar Depression, Anxiety, Unipolar Depression,  Phase Of Life Problems, Anxiety  Goals 1. Appropriately grieve the loss in order to normalize mood and to return  to previously adaptive level of functioning. 2. Balance life activities between consideration of others and development of own interests. Objective Apply problem-solving skills to current circumstances Target Date: 2022-10-15 Frequency: Biweekly Progress: 90 Modality: individual Related Interventions 1. Use modeling and role-playing with the client to apply the problem-solving approach to his/her  current circumstances (or assign "Applying Problem-Solving to Interpersonal Conflict" from  the Adult Psychotherapy Homework Planner by [facility]); encourage implementation of action  plan, reinforcing success and redirecting for failure. Objective Implement new activities that increase a sense of satisfaction. Target Date: 2022-10-15 Frequency: Biweekly Progress: 90 Modality: individual Related Interventions 1. Develop a  plan with the client to  include activities that will increase his/her satisfaction, fulfill  his/her values, and improve the quality of his/her life. 3. Develop healthy interpersonal relationships that lead to the alleviation  and help prevent the relapse of depression. Objective Learn and implement problem-solving and decision-making skills. Target Date: 2022-10-15 Frequency: Biweekly Progress: 90 Modality: individual Related Interventions 1. Encourage in the client the development of a positive problem orientation in which problems  and solving them are viewed as a natural part of life and not something to be feared, despaired,  or avoided. 2. Conduct Problem-Solving Therapy  Implement new activities that increase a sense of satisfaction. Target Date: 2022-10-15 Frequency: Biweekly Progress: 90 Modality: individual Related Interventions 1. Develop a plan with the client to include activities that will increase his/her satisfaction, fulfill  his/her values, and improve the quality of his/her life. 3. Develop healthy interpersonal relationships that lead to the alleviation  and help prevent the relapse of depression. Objective Learn and implement problem-solving and decision-making skills. Target Date: 2022-10-15 Frequency: Biweekly Progress: 90 Modality: individual Related Interventions 1. Encourage in the client the development of a positive problem orientation in which problems  and solving them are viewed as a natural part of life and not something to be feared, despaired,  or avoided.  Objective Identify and replace thoughts and beliefs that support depression. Target Date: 2022-10-15 Frequency: Biweekly Progress: 90 Modality: individual Related Interventions 1. Explore and restructure underlying assumptions and beliefs reflected in biased self-talk that  may put the client at risk for relapse or recurrence. 2. Facilitate and reinforce the client's shift from biased depressive self-talk and beliefs  to realitybased cognitive messages that enhance self-confidence and increase adaptive actions (see  "Positive Self-Talk" in the Adult Psychotherapy Homework Planner by Bryn Gulling). 4. Enhance ability to effectively cope with the full variety of life's worries  and anxieties. Objective Identify, challenge, and replace biased, fearful self-talk with positive, realistic, and empowering selftalk. Target Date: 2022-10-15 Frequency: Biweekly Progress: 80 Modality: individual Related Interventions 1. Explore the client's schema and self-talk that mediate his/her fear response; assist him/her in  challenging the biases; replace the distorted messages with reality-based alternatives and  positive, realistic self-talk that will increase his/her self-confidence in coping with irrational  fears (see Cognitive Therapy of Anxiety Disorders by Alison Stalling). Objective Learn and implement problem-solving strategies for realistically addressing worries. Target Date: 2022-10-15 Frequency: Biweekly Progress: 60 Modality: individual Therapy 5. Recognize, accept, and cope with feelings of depression. 6. Resolve conflicted feelings and adapt to the new life circumstances. 7. Stabilize anxiety level while increasing ability to function on a daily  The patient is making progress and has approved this plan of treatment.  Donnel Venuto G Lorynn Moeser, LCSW

## 2022-09-28 ENCOUNTER — Ambulatory Visit (INDEPENDENT_AMBULATORY_CARE_PROVIDER_SITE_OTHER): Payer: Medicare Other | Admitting: Psychology

## 2022-09-28 DIAGNOSIS — F4323 Adjustment disorder with mixed anxiety and depressed mood: Secondary | ICD-10-CM

## 2022-09-28 DIAGNOSIS — F3341 Major depressive disorder, recurrent, in partial remission: Secondary | ICD-10-CM

## 2022-09-28 DIAGNOSIS — F411 Generalized anxiety disorder: Secondary | ICD-10-CM | POA: Diagnosis not present

## 2022-09-28 NOTE — Progress Notes (Signed)
[facility] Counselor/Therapist Progress Note  Patient ID: Kristin Sparks, MRN: [id number],    Date: 0/10/2022  Time Spent: 60 minutes  Treatment Type: Individual Therapy  Reported Symptoms: anxiety and depression  Mental Status Exam: Appearance:  Casual     Behavior: Appropriate  Motor: Normal  Speech/Language:  Normal Rate  Affect: Blunt  Mood: pleasant  Thought process: normal  Thought content:   WNL  Sensory/Perceptual disturbances:   WNL  Orientation: oriented to person, place, time/date, and situation  Attention: Good  Concentration: Good  Memory: WNL  Fund of knowledge:  Good  Insight:   Good  Judgment:  Good  Impulse Control: Good   Risk Assessment: Danger to Self:  No Self-injurious Behavior: No Danger to Others: No Duty to Warn:no Physical Aggression / Violence:No  Access to Firearms a concern: No  Gang Involvement:No   Subjective: The patient attended a face-to-face individual therapy session via video visit.  The patient gave verbal consent to have the session on video on WebEx.  The patient was in her home alone and the therapist was in the office.  The patient talked about going on a diet for about a week.  We discussed some of the things that she is doing and it seems that she is trying to be healthy in the way that she is losing weight.  She also talked about concern about her her son's dog but she is responsible for much of the time.  It appears that the dog seems to be going backwards in her training and there is some concern that she could be aggressive at some point.  We discussed a plan for her to contact some trainers to see if she can get the dog taking care of of and then I recommended that she speak with a that if the dog does not respond to the behavioral training.  Interventions: Cognitive Behavioral Therapy and Assertiveness/Communication  Diagnosis:Generalized anxiety disorder  Depression, major, recurrent, in partial  remission (HCC)  Adjustment disorder with mixed anxiety and depressed mood  Plan:  Client Abilities/Strengths  Intelligent, caring, motivated  Client Treatment Preferences  Outpatient individual therapy  Client Statement of Needs  " I need some help to deal with my situation", "I 'm not sure if I'm depressed or what?"  Treatment Level  Outpatient Individual therapy  Symptoms  Frustration and anxiety related to providing oversight and caretaking to an aging, ailing, and dependent  parent.: No Description Entered (Status: improved). Lack of energy.: No Description Entered (Status:  improved). Motor tension (e.g., restlessness, tiredness, shakiness, muscle tension).: No Description  Entered (Status: improved). Poor concentration and indecisiveness.: No Description Entered (Status:  improved). Social withdrawal.: No Description Entered (Status: improved).  Problems Addressed  Unipolar Depression, Phase Of Life Problems, Unipolar Depression, Anxiety, Unipolar Depression,  Phase Of Life Problems, Anxiety  Goals 1. Appropriately grieve the loss in order to normalize mood and to return  to previously adaptive level of functioning. 2. Balance life activities between consideration of others and development of own interests. Objective Apply problem-solving skills to current circumstances Target Date: 2022-10-15 Frequency: Biweekly Progress: 90 Modality: individual Related Interventions 1. Use modeling and role-playing with the client to apply the problem-solving approach to his/her  current circumstances (or assign "Applying Problem-Solving to Interpersonal Conflict" from  the Adult Psychotherapy Homework Planner by [facility]); encourage implementation of action  plan, reinforcing success and redirecting for failure. Objective Implement new activities that increase a sense of satisfaction. Target Date:  2022-10-15 Frequency: Biweekly Progress: 90 Modality: individual Related  Interventions 1. Develop a plan with the client to include activities that will increase his/her satisfaction, fulfill  his/her values, and improve the quality of his/her life. 3. Develop healthy interpersonal relationships that lead to the alleviation  and help prevent the relapse of depression. Objective Learn and implement problem-solving and decision-making skills. Target Date: 2022-10-15 Frequency: Biweekly Progress: 90 Modality: individual Related Interventions 1. Encourage in the client the development of a positive problem orientation in which problems  and solving them are viewed as a natural part of life and not something to be feared, despaired,  or avoided. 2. Conduct Problem-Solving Therapy  Implement new activities that increase a sense of satisfaction. Target Date: 2022-10-15 Frequency: Biweekly Progress: 90 Modality: individual Related Interventions 1. Develop a plan with the client to include activities that will increase his/her satisfaction, fulfill  his/her values, and improve the quality of his/her life. 3. Develop healthy interpersonal relationships that lead to the alleviation  and help prevent the relapse of depression. Objective Learn and implement problem-solving and decision-making skills. Target Date: 2022-10-15 Frequency: Biweekly Progress: 90 Modality: individual Related Interventions 1. Encourage in the client the development of a positive problem orientation in which problems  and solving them are viewed as a natural part of life and not something to be feared, despaired,  or avoided.  Objective Identify and replace thoughts and beliefs that support depression. Target Date: 2023-10-16 Frequency: Biweekly Progress: 90 Modality: individual Related Interventions 1. Explore and restructure underlying assumptions and beliefs reflected in biased self-talk that  may put the client at risk for relapse or recurrence. 2. Facilitate and reinforce the  client's shift from biased depressive self-talk and beliefs to realitybased cognitive messages that enhance self-confidence and increase adaptive actions (see  "Positive Self-Talk" in the Adult Psychotherapy Homework Planner by Bryn Gulling). 4. Enhance ability to effectively cope with the full variety of life's worries  and anxieties. Objective Identify, challenge, and replace biased, fearful self-talk with positive, realistic, and empowering selftalk. Target Date: 2023-10-16 Frequency: Biweekly Progress: 80 Modality: individual Related Interventions 1. Explore the client's schema and self-talk that mediate his/her fear response; assist him/her in  challenging the biases; replace the distorted messages with reality-based alternatives and  positive, realistic self-talk that will increase his/her self-confidence in coping with irrational  fears (see Cognitive Therapy of Anxiety Disorders by Alison Stalling). Objective Learn and implement problem-solving strategies for realistically addressing worries. Target Date: 2023-10-16 Frequency: Biweekly Progress: 60 Modality: individual Therapy 5. Recognize, accept, and cope with feelings of depression. 6. Resolve conflicted feelings and adapt to the new life circumstances. 7. Stabilize anxiety level while increasing ability to function on a daily  The patient is making progress and has approved this plan of treatment.  Hebah Bogosian G Thelda Gagan, LCSW

## 2022-10-12 ENCOUNTER — Ambulatory Visit (INDEPENDENT_AMBULATORY_CARE_PROVIDER_SITE_OTHER): Payer: Medicare Other | Admitting: Psychology

## 2022-10-12 DIAGNOSIS — F4323 Adjustment disorder with mixed anxiety and depressed mood: Secondary | ICD-10-CM | POA: Diagnosis not present

## 2022-10-12 DIAGNOSIS — F3341 Major depressive disorder, recurrent, in partial remission: Secondary | ICD-10-CM | POA: Diagnosis not present

## 2022-10-12 DIAGNOSIS — F411 Generalized anxiety disorder: Secondary | ICD-10-CM | POA: Diagnosis not present

## 2022-10-13 ENCOUNTER — Telehealth (INDEPENDENT_AMBULATORY_CARE_PROVIDER_SITE_OTHER): Payer: Medicare Other | Admitting: Psychiatry

## 2022-10-13 ENCOUNTER — Encounter: Payer: Self-pay | Admitting: Psychiatry

## 2022-10-13 DIAGNOSIS — F411 Generalized anxiety disorder: Secondary | ICD-10-CM

## 2022-10-13 DIAGNOSIS — F3341 Major depressive disorder, recurrent, in partial remission: Secondary | ICD-10-CM

## 2022-10-13 DIAGNOSIS — G4700 Insomnia, unspecified: Secondary | ICD-10-CM | POA: Diagnosis not present

## 2022-10-13 MED ORDER — BUSPIRONE HCL 15 MG PO TABS: 15.0000 mg | ORAL_TABLET | Freq: Every morning | ORAL | 1 refills | Status: DC

## 2022-10-13 MED ORDER — DULOXETINE HCL 30 MG PO CPEP: 30.0000 mg | ORAL_CAPSULE | Freq: Every morning | ORAL | 1 refills | Status: DC

## 2022-10-13 NOTE — Progress Notes (Signed)
Kristin Sparks [id number] 02/13/1956 67 y.o.  Virtual Visit via Video Note  I connected with pt @ on 10/13/22 at 10:00 AM EST by a video enabled telemedicine application and verified that I am speaking with the correct person using two identifiers.   I discussed the limitations of evaluation and management by telemedicine and the availability of in person appointments. The patient expressed understanding and agreed to proceed.  I discussed the assessment and treatment plan with the patient. The patient was provided an opportunity to ask questions and all were answered. The patient agreed with the plan and demonstrated an understanding of the instructions.   The patient was advised to call back or seek an in-person evaluation if the symptoms worsen or if the condition fails to improve as anticipated.  I provided 25 minutes of non-face-to-face time during this encounter.  The patient was located at home.  The provider was located at [place].   Thayer Headings, PMHNP   Subjective:   Patient ID:  Kristin Sparks is a 67 y.o. (DOB 1955-10-06) female.  Chief Complaint:  Chief Complaint  Patient presents with   Follow-up    Anxiety, depression, and insomnia    HPI Kristin Sparks presents for follow-up of anxiety, depression, and insomnia. She reports that she has "been doing ok." She had some seasonal depression in October and November and felt "moody" and this resolved. She reports that her energy and motivation have been ok. She reports that her anxiety has been controlled.   She reports that she feels "more alert" with lower dose of Cymbalta. She reports that every 3-4 nights she has some difficulty falling sleep. She reports that prn medications are helpful. Typically sleeping from 11 or 11:30 until 7:30- 8 am. She denies anxious thoughts when trying to fall asleep. She does not feel like she needs a nap in the afternoon. Denies any change in appetite. Energy and  motivation have been good. She reports that her concentration is "off." She reports that she will go into a room and forget what she went in there to do. She has difficulty staying on one task. She feels like she is less productive. She is no longer cleaning excessively. Denies anhedonia. Denies SI.   She reports that she does some group activities and runs. She has her dog in a training group. She has been going to the gym and taking a strength training class. She and her sister get together periodically. She reports that she has not anxiety in social situation.   She reports that her son's dog has become her dog. She is planning some trips.   Continues to see [employer], LCSW regularly.   Past medication trials: Sertraline-diarrhea.  Felt calmer. Lexapro-prescribed for hot flashes.  Had increased anxiety while taking. Effexor XR-some benefit and then was less effective.  Signs and symptoms inadequately controlled at 75 mg and unable to tolerate higher doses. Cymbalta Buspar Propanolol Ativan Hydroxyzine Lyrica Gabapentin-did not help with neuropathy  Review of Systems:  Review of Systems  Musculoskeletal:  Positive for arthralgias. Negative for gait problem.  Neurological:        Slight increase in nerve pain  Psychiatric/Behavioral:         Please refer to HPI    Medications: I have reviewed the patient's current medications.  Current Outpatient Medications  Medication Sig Dispense Refill   calcium-vitamin D (OSCAL-500) 500-400 MG-UNIT tablet Take by mouth.     hydrOXYzine (ATARAX/VISTARIL) 10 MG tablet  Take 1 tablet (10 mg total) by mouth at bedtime as needed for up to 30 days. 30 tablet 2   levocetirizine (XYZAL) 5 MG tablet Take 5 mg by mouth daily as needed.      lovastatin (MEVACOR) 20 MG tablet Take 20 mg by mouth once a week.      meloxicam (MOBIC) 7.5 MG tablet Take 7.5 mg by mouth as needed.     Omega-3 1000 MG CAPS Take by mouth.     traZODone (DESYREL) 50 MG  tablet Take 1/2-2 tabs po QHS prn insomnia 60 tablet 2   albuterol (PROVENTIL HFA;VENTOLIN HFA) 108 (90 Base) MCG/ACT inhaler Inhale into the lungs.     busPIRone (BUSPAR) 15 MG tablet Take 1 tablet (15 mg total) by mouth every morning. 90 tablet 1   DULoxetine (CYMBALTA) 30 MG capsule Take 1 capsule (30 mg total) by mouth every morning. 90 capsule 1   Fluticasone-Salmeterol (ADVAIR DISKUS) 250-50 MCG/DOSE AEPB Inhale into the lungs.     LORazepam (ATIVAN) 0.5 MG tablet Take 1/2-1 tab po q 6 hours as needed for anxiety (Patient not taking: Reported on 07/07/2022) 15 tablet 0   pregabalin (LYRICA) 75 MG capsule Take 1 capsule (75 mg total) by mouth every evening. (Patient not taking: Reported on 07/07/2022) 90 capsule 1   propranolol (INDERAL) 10 MG tablet Take 1/2-1 tab po BID prn anxiety (Do not use if pulse is <70) (Patient not taking: Reported on 07/07/2022) 60 tablet 1   No current facility-administered medications for this visit.    Medication Side Effects: None  Allergies:  Allergies  Allergen Reactions   Latex    Sulfa Antibiotics Rash    Past Medical History:  Diagnosis Date   Allergic rhinitis    Asthma    Elevated cholesterol    Neuropathy    Osteoarthritis     Family History  Problem Relation Age of Onset   Alcohol abuse Father    Depression Sister    Breast cancer Maternal Grandmother     Social History   Socioeconomic History   Marital status: Widowed    Spouse name: Not on file   Number of children: Not on file   Years of education: Not on file   Highest education level: Not on file  Occupational History   Not on file  Tobacco Use   Smoking status: Never   Smokeless tobacco: Never  Substance and Sexual Activity   Alcohol use: Not on file   Drug use: Not on file   Sexual activity: Not on file  Other Topics Concern   Not on file  Social History Narrative   Not on file   Social Determinants of Health   Financial Resource Strain: Not on file   Food Insecurity: Not on file  Transportation Needs: Not on file  Physical Activity: Not on file  Stress: Not on file  Social Connections: Not on file  Intimate Partner Violence: Not on file    Past Medical History, Surgical history, Social history, and Family history were reviewed and updated as appropriate.   Please see review of systems for further details on the patient's review from today.   Objective:   Physical Exam:  There were no vitals taken for this visit.  Physical Exam Constitutional:      General: She is not in acute distress. Musculoskeletal:        General: No deformity.  Neurological:     Mental Status: She is alert and oriented  to person, place, and time.     Coordination: Coordination normal.  Psychiatric:        Attention and Perception: Attention and perception normal. She does not perceive auditory or visual hallucinations.        Mood and Affect: Mood normal. Mood is not anxious or depressed. Affect is not labile, blunt, angry or inappropriate.        Speech: Speech normal.        Behavior: Behavior normal.        Thought Content: Thought content normal. Thought content is not paranoid or delusional. Thought content does not include homicidal or suicidal ideation. Thought content does not include homicidal or suicidal plan.        Cognition and Memory: Cognition and memory normal.        Judgment: Judgment normal.     Comments: Insight intact     Lab Review:  No results found for: "NA", "K", "CL", "CO2", "GLUCOSE", "BUN", "CREATININE", "CALCIUM", "PROT", "ALBUMIN", "AST", "ALT", "ALKPHOS", "BILITOT", "GFRNONAA", "GFRAA"  No results found for: "WBC", "RBC", "HGB", "HCT", "PLT", "MCV", "MCH", "MCHC", "RDW", "LYMPHSABS", "MONOABS", "EOSABS", "BASOSABS"  No results found for: "POCLITH", "LITHIUM"   No results found for: "PHENYTOIN", "PHENOBARB", "VALPROATE", "CBMZ"   .res Assessment: Plan:   Will continue current plan of care since target signs and  symptoms are well controlled without any tolerability issues. Continue Cymbalta 30 mg daily for anxiety and depression. Pt reports some increase in arthralgia in her knee and slight nerve pain with lower dose of Cymbalta, however she prefers to continue lower dose since she feels more alert at this dose.  Continue Buspar 15 mg po qd for anxiety.  Continue Hydroxyzine prn and Trazodone prn for insomnia. She reports that she does not need a script for these medications since she uses them infrequently.  Continue Propranolol and Lorazepam prn anxiety. She reports that she has not needed these medications recently, however she keeps Propranolol prn on hand in case she has acute anxiety. She reports that she does not need refills at this time.  Recommend continuing therapy with Bambi Cottle, LCSW.  Pt to follow-up in 6 months or sooner if clinically indicated.  Patient advised to contact office with any questions, adverse effects, or acute worsening in signs and symptoms.  Sheilah was seen today for follow-up.  Diagnoses and all orders for this visit:  Generalized anxiety disorder -     busPIRone (BUSPAR) 15 MG tablet; Take 1 tablet (15 mg total) by mouth every morning. -     DULoxetine (CYMBALTA) 30 MG capsule; Take 1 capsule (30 mg total) by mouth every morning.  Depression, major, recurrent, in partial remission (HCC) -     DULoxetine (CYMBALTA) 30 MG capsule; Take 1 capsule (30 mg total) by mouth every morning.  Insomnia, unspecified type     Please see After Visit Summary for patient specific instructions.  Future Appointments  Date Time Provider [place]  10/26/2022  9:00 AM Cottle, Lucious Groves, LCSW [facility] None  11/09/2022  9:00 AM Cottle, Bambi G, LCSW [facility] None  11/23/2022  9:00 AM Cottle, Bambi G, LCSW [facility] None  12/07/2022  9:00 AM Cottle, Bambi G, LCSW [facility] None  12/21/2022  9:00 AM Cottle, Bambi G, LCSW [facility] None  01/04/2023  9:00 AM Cottle, Bambi G, LCSW  [facility] None  01/18/2023  9:00 AM Cottle, Bambi G, LCSW [facility] None  02/01/2023  9:00 AM Cottle, Bambi G, LCSW [facility] None  02/15/2023  9:00  AM Cottle, Bambi G, LCSW [facility] None  03/01/2023  9:00 AM Cottle, Bambi G, LCSW [facility] None  03/15/2023  9:00 AM Cottle, Bambi G, LCSW [facility] None    No orders of the defined types were placed in this encounter.     -------------------------------

## 2022-10-13 NOTE — Progress Notes (Signed)
[place] Counselor/Therapist Progress Note  Patient ID: ORMA CHEETHAM, MRN: [id number],    Date: 0/16/2024  Time Spent: 60 minutes  Treatment Type: Individual Therapy  Reported Symptoms: anxiety and depression  Mental Status Exam: Appearance:  Casual     Behavior: Appropriate  Motor: Normal  Speech/Language:  Normal Rate  Affect: Blunt  Mood: pleasant  Thought process: normal  Thought content:   WNL  Sensory/Perceptual disturbances:   WNL  Orientation: oriented to person, place, time/date, and situation  Attention: Good  Concentration: Good  Memory: WNL  Fund of knowledge:  Good  Insight:   Good  Judgment:  Good  Impulse Control: Good   Risk Assessment: Danger to Self:  No Self-injurious Behavior: No Danger to Others: No Duty to Warn:no Physical Aggression / Violence:No  Access to Firearms a concern: No  Gang Involvement:No   Subjective: The patient attended a face-to-face individual therapy session via video visit.  The patient gave verbal consent to have the session on video on WebEx.  The patient was in her home alone and the therapist was in the office.  The patient presents with a blunted affect and mood is pleasant.  The patient reports that she took her son's dog to behavioral training and it seems to be going well.  She has decided just to go ahead and keep the dog with her since taking the dog back and forth is disruptive to the dogs stability.  The patient reports that she feels good about this.  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  The patient still would like to go places and do things but with the dog now and the weather she is having difficulty getting herself motivated.  We talked about her continuing to do what she is doing and have something that she can look forward to moving forward such as a trip.  Interventions: Cognitive Behavioral Therapy and  Assertiveness/Communication  Diagnosis:Generalized anxiety disorder  Depression, major, recurrent, in partial remission (HCC)  Adjustment disorder with mixed anxiety and depressed mood  Plan:  Client Abilities/Strengths  Intelligent, caring, motivated  Client Treatment Preferences  Outpatient individual therapy  Client Statement of Needs  " I need some help to deal with my situation", "I 'm not sure if I'm depressed or what?"  Treatment Level  Outpatient Individual therapy  Symptoms  Frustration and anxiety related to providing oversight and caretaking to an aging, ailing, and dependent  parent.: No Description Entered (Status: improved). Lack of energy.: No Description Entered (Status:  improved). Motor tension (e.g., restlessness, tiredness, shakiness, muscle tension).: No Description  Entered (Status: improved). Poor concentration and indecisiveness.: No Description Entered (Status:  improved). Social withdrawal.: No Description Entered (Status: improved).  Problems Addressed  Unipolar Depression, Phase Of Life Problems, Unipolar Depression, Anxiety, Unipolar Depression,  Phase Of Life Problems, Anxiety  Goals 1. Appropriately grieve the loss in order to normalize mood and to return  to previously adaptive level of functioning. 2. Balance life activities between consideration of others and development of own interests. Objective Apply problem-solving skills to current circumstances Target Date: 2023-10-16 Frequency: Biweekly Progress: 90 Modality: individual Related Interventions 1. Use modeling and role-playing with the client to apply the problem-solving approach to his/her  current circumstances (or assign "Applying Problem-Solving to Interpersonal Conflict" from  the Adult Psychotherapy Homework Planner by Bryn Gulling); encourage implementation of action  plan, reinforcing success and redirecting  for failure. Objective Implement new activities that increase a sense of  satisfaction. Target Date: 2023-10-16 Frequency: Biweekly Progress: 90 Modality: individual Related Interventions 1. Develop a plan with the client to include activities that will increase his/her satisfaction, fulfill  his/her values, and improve the quality of his/her life. 3. Develop healthy interpersonal relationships that lead to the alleviation  and help prevent the relapse of depression. Objective Learn and implement problem-solving and decision-making skills. Target Date: 2023-10-16 Frequency: Biweekly Progress: 90 Modality: individual Related Interventions 1. Encourage in the client the development of a positive problem orientation in which problems  and solving them are viewed as a natural part of life and not something to be feared, despaired,  or avoided. 2. Conduct Problem-Solving Therapy  Implement new activities that increase a sense of satisfaction. Target Date: 2022-10-15 Frequency: Biweekly Progress: 90 Modality: individual Related Interventions 1. Develop a plan with the client to include activities that will increase his/her satisfaction, fulfill  his/her values, and improve the quality of his/her life. 3. Develop healthy interpersonal relationships that lead to the alleviation  and help prevent the relapse of depression. Objective Learn and implement problem-solving and decision-making skills. Target Date: 2023-10-16 Frequency: Biweekly Progress: 90 Modality: individual Related Interventions 1. Encourage in the client the development of a positive problem orientation in which problems  and solving them are viewed as a natural part of life and not something to be feared, despaired,  or avoided.  Objective Identify and replace thoughts and beliefs that support depression. Target Date: 2023-10-16 Frequency: Biweekly Progress: 90 Modality: individual Related Interventions 1. Explore and restructure underlying assumptions and beliefs reflected in biased  self-talk that  may put the client at risk for relapse or recurrence. 2. Facilitate and reinforce the client's shift from biased depressive self-talk and beliefs to realitybased cognitive messages that enhance self-confidence and increase adaptive actions (see  "Positive Self-Talk" in the Adult Psychotherapy Homework Planner by Bryn Gulling). 4. Enhance ability to effectively cope with the full variety of life's worries  and anxieties. Objective Identify, challenge, and replace biased, fearful self-talk with positive, realistic, and empowering selftalk. Target Date: 2023-10-16 Frequency: Biweekly Progress: 80 Modality: individual Related Interventions 1. Explore the client's schema and self-talk that mediate his/her fear response; assist him/her in  challenging the biases; replace the distorted messages with reality-based alternatives and  positive, realistic self-talk that will increase his/her self-confidence in coping with irrational  fears (see Cognitive Therapy of Anxiety Disorders by Alison Stalling). Objective Learn and implement problem-solving strategies for realistically addressing worries. Target Date: 2023-10-16 Frequency: Biweekly Progress: 60 Modality: individual Therapy 5. Recognize, accept, and cope with feelings of depression. 6. Resolve conflicted feelings and adapt to the new life circumstances. 7. Stabilize anxiety level while increasing ability to function on a daily  The patient is making progress and has approved this plan of treatment.  Janiel Derhammer G Grethel Zenk, LCSW

## 2022-10-26 ENCOUNTER — Ambulatory Visit (INDEPENDENT_AMBULATORY_CARE_PROVIDER_SITE_OTHER): Payer: Medicare Other | Admitting: Psychology

## 2022-10-26 DIAGNOSIS — F4323 Adjustment disorder with mixed anxiety and depressed mood: Secondary | ICD-10-CM

## 2022-10-26 DIAGNOSIS — F3341 Major depressive disorder, recurrent, in partial remission: Secondary | ICD-10-CM

## 2022-10-26 DIAGNOSIS — F411 Generalized anxiety disorder: Secondary | ICD-10-CM | POA: Diagnosis not present

## 2022-10-26 NOTE — Progress Notes (Signed)
[place] Counselor/Therapist Progress Note  Patient ID: ZARYN WHEATCRAFT, MRN: [id number],    Date: 0/30/2024  Time Spent: 60 minutes  Treatment Type: Individual Therapy  Reported Symptoms: anxiety and depression  Mental Status Exam: Appearance:  Casual     Behavior: Appropriate  Motor: Normal  Speech/Language:  Normal Rate  Affect: Blunt  Mood: pleasant  Thought process: normal  Thought content:   WNL  Sensory/Perceptual disturbances:   WNL  Orientation: oriented to person, place, time/date, and situation  Attention: Good  Concentration: Good  Memory: WNL  Fund of knowledge:  Good  Insight:   Good  Judgment:  Good  Impulse Control: Good   Risk Assessment: Danger to Self:  No Self-injurious Behavior: No Danger to Others: No Duty to Warn:no Physical Aggression / Violence:No  Access to Firearms a concern: No  Gang Involvement:No   Subjective: The patient attended a face-to-face individual therapy session via video visit.  The patient gave verbal consent to have the session on video on WebEx.  The patient was in her home alone and the therapist was in the office.  The patient presents with a blunted affect and mood is pleasant.  The patient is continuing to clean out her home.  We talked about January being a good month to do that as she has seasonal affective disorder.  The patient also talked about having her son's dog and having to take her to a trainer.  The patient is likely to have to return home this dog because she is very strong and its possible that she could pull the patient down.  The patient still has not gone on any trips or made any arrangements to do so.  We continued to talk about her need to plan trips without relying on her sister to go.  She states that she feels like her sister is becoming depressed and she has told her sister before that she needs to get into therapy.  We continued to work with cognitive behavioral therapy and some  solution focused techniques. Interventions: Cognitive Behavioral Therapy and Assertiveness/Communication  Diagnosis:Generalized anxiety disorder  Depression, major, recurrent, in partial remission (HCC)  Adjustment disorder with mixed anxiety and depressed mood  Plan:  Client Abilities/Strengths  Intelligent, caring, motivated  Client Treatment Preferences  Outpatient individual therapy  Client Statement of Needs  " I need some help to deal with my situation", "I 'm not sure if I'm depressed or what?"  Treatment Level  Outpatient Individual therapy  Symptoms  Frustration and anxiety related to providing oversight and caretaking to an aging, ailing, and dependent  parent.: No Description Entered (Status: improved). Lack of energy.: No Description Entered (Status:  improved). Motor tension (e.g., restlessness, tiredness, shakiness, muscle tension).: No Description  Entered (Status: improved). Poor concentration and indecisiveness.: No Description Entered (Status:  improved). Social withdrawal.: No Description Entered (Status: improved).  Problems Addressed  Unipolar Depression, Phase Of Life Problems, Unipolar Depression, Anxiety, Unipolar Depression,  Phase Of Life Problems, Anxiety  Goals 1. Appropriately grieve the loss in order to normalize mood and to return  to previously adaptive level of functioning. 2. Balance life activities between consideration of others and development of own interests. Objective Apply problem-solving skills to current circumstances Target Date: 2023-10-16 Frequency: Biweekly Progress: 90 Modality: individual Related Interventions 1. Use modeling and role-playing with the client to apply the problem-solving approach to his/her  current circumstances (or assign "Applying Problem-Solving to Interpersonal Conflict" from  the Adult Psychotherapy Homework  Planner by Bryn Gulling); encourage implementation of action  plan, reinforcing success and redirecting  for failure. Objective Implement new activities that increase a sense of satisfaction. Target Date: 2023-10-16 Frequency: Biweekly Progress: 90 Modality: individual Related Interventions 1. Develop a plan with the client to include activities that will increase his/her satisfaction, fulfill  his/her values, and improve the quality of his/her life. 3. Develop healthy interpersonal relationships that lead to the alleviation  and help prevent the relapse of depression. Objective Learn and implement problem-solving and decision-making skills. Target Date: 2023-10-16 Frequency: Biweekly Progress: 90 Modality: individual Related Interventions 1. Encourage in the client the development of a positive problem orientation in which problems  and solving them are viewed as a natural part of life and not something to be feared, despaired,  or avoided. 2. Conduct Problem-Solving Therapy  Implement new activities that increase a sense of satisfaction. Target Date: 2022-10-15 Frequency: Biweekly Progress: 90 Modality: individual Related Interventions 1. Develop a plan with the client to include activities that will increase his/her satisfaction, fulfill  his/her values, and improve the quality of his/her life. 3. Develop healthy interpersonal relationships that lead to the alleviation  and help prevent the relapse of depression. Objective Learn and implement problem-solving and decision-making skills. Target Date: 2023-10-16 Frequency: Biweekly Progress: 90 Modality: individual Related Interventions 1. Encourage in the client the development of a positive problem orientation in which problems  and solving them are viewed as a natural part of life and not something to be feared, despaired,  or avoided.  Objective Identify and replace thoughts and beliefs that support depression. Target Date: 2023-10-16 Frequency: Biweekly Progress: 90 Modality: individual Related Interventions 1. Explore and  restructure underlying assumptions and beliefs reflected in biased self-talk that  may put the client at risk for relapse or recurrence. 2. Facilitate and reinforce the client's shift from biased depressive self-talk and beliefs to realitybased cognitive messages that enhance self-confidence and increase adaptive actions (see  "Positive Self-Talk" in the Adult Psychotherapy Homework Planner by Bryn Gulling). 4. Enhance ability to effectively cope with the full variety of life's worries  and anxieties. Objective Identify, challenge, and replace biased, fearful self-talk with positive, realistic, and empowering selftalk. Target Date: 2023-10-16 Frequency: Biweekly Progress: 80 Modality: individual Related Interventions 1. Explore the client's schema and self-talk that mediate his/her fear response; assist him/her in  challenging the biases; replace the distorted messages with reality-based alternatives and  positive, realistic self-talk that will increase his/her self-confidence in coping with irrational  fears (see Cognitive Therapy of Anxiety Disorders by Alison Stalling). Objective Learn and implement problem-solving strategies for realistically addressing worries. Target Date: 2023-10-16 Frequency: Biweekly Progress: 60 Modality: individual Therapy 5. Recognize, accept, and cope with feelings of depression. 6. Resolve conflicted feelings and adapt to the new life circumstances. 7. Stabilize anxiety level while increasing ability to function on a daily  The patient is making progress and has approved this plan of treatment.  Edrie Ehrich G Shylin Keizer, LCSW

## 2022-11-09 ENCOUNTER — Ambulatory Visit: Payer: Medicare Other | Admitting: Psychology

## 2022-11-23 ENCOUNTER — Ambulatory Visit (INDEPENDENT_AMBULATORY_CARE_PROVIDER_SITE_OTHER): Payer: Medicare Other | Admitting: Psychology

## 2022-11-23 DIAGNOSIS — F3341 Major depressive disorder, recurrent, in partial remission: Secondary | ICD-10-CM | POA: Diagnosis not present

## 2022-11-23 DIAGNOSIS — F411 Generalized anxiety disorder: Secondary | ICD-10-CM | POA: Diagnosis not present

## 2022-11-23 DIAGNOSIS — F4323 Adjustment disorder with mixed anxiety and depressed mood: Secondary | ICD-10-CM | POA: Diagnosis not present

## 2022-11-23 NOTE — Progress Notes (Addendum)
[place] Counselor/Therapist Progress Note  Patient ID: Kristin Sparks, MRN: [id number],    Date: 11/23/2022  Time Spent: 60 minutes  Treatment Type: Individual Therapy  Reported Symptoms: anxiety and depression  Mental Status Exam: Appearance:  Casual     Behavior: Appropriate  Motor: Normal  Speech/Language:  Normal Rate  Affect: Blunt  Mood: pleasant  Thought process: normal  Thought content:   WNL  Sensory/Perceptual disturbances:   WNL  Orientation: oriented to person, place, time/date, and situation  Attention: Good  Concentration: Good  Memory: WNL  Fund of knowledge:  Good  Insight:   Good  Judgment:  Good  Impulse Control: Good   Risk Assessment: Danger to Self:  No Self-injurious Behavior: No Danger to Others: No Duty to Warn:no Physical Aggression / Violence:No  Access to Firearms a concern: No  Gang Involvement:No   Subjective: The patient attended a face-to-face individual therapy session via video visit.  The patient gave verbal consent to have the session on video on WebEx.  The patient was in her home alone and the therapist was in the office.  The patient presents with a blunted affect and mood is pleasant.  The patient is continuing to clean out her home.  We talked about January being a good month to do that as she has seasonal affective disorder.  The patient also talked about having her son's dog and having to take her to a trainer.  The patient is likely to have to return home this dog because she is very strong and its possible that she could pull the patient down.  The patient still has not gone on any trips or made any arrangements to do so.  We continued to talk about her need to plan trips without relying on her sister to go.  She states that she feels like her sister is becoming depressed and she has told her sister before that she needs to get into therapy.  We continued to work with cognitive behavioral therapy and some  solution focused techniques. Interventions: Cognitive Behavioral Therapy and Assertiveness/Communication  Diagnosis:Generalized anxiety disorder  Depression, major, recurrent, in partial remission (HCC)  Adjustment disorder with mixed anxiety and depressed mood  Plan:  Client Abilities/Strengths  Intelligent, caring, motivated  Client Treatment Preferences  Outpatient individual therapy  Client Statement of Needs  " I need some help to deal with my situation", "I 'm not sure if I'm depressed or what?"  Treatment Level  Outpatient Individual therapy  Symptoms  Frustration and anxiety related to providing oversight and caretaking to an aging, ailing, and dependent  parent.: No Description Entered (Status: improved). Lack of energy.: No Description Entered (Status:  improved). Motor tension (e.g., restlessness, tiredness, shakiness, muscle tension).: No Description  Entered (Status: improved). Poor concentration and indecisiveness.: No Description Entered (Status:  improved). Social withdrawal.: No Description Entered (Status: improved).  Problems Addressed  Unipolar Depression, Phase Of Life Problems, Unipolar Depression, Anxiety, Unipolar Depression,  Phase Of Life Problems, Anxiety  Goals 1. Appropriately grieve the loss in order to normalize mood and to return  to previously adaptive level of functioning. 2. Balance life activities between consideration of others and development of own interests. Objective Apply problem-solving skills to current circumstances Target Date: 2023-10-16 Frequency: Biweekly Progress: 90 Modality: individual Related Interventions 1. Use modeling and role-playing with the client to apply the problem-solving approach to his/her  current circumstances (or assign "Applying Problem-Solving to Interpersonal Conflict" from  the Adult Psychotherapy Homework  Planner by Bryn Gulling); encourage implementation of action  plan, reinforcing success and redirecting  for failure. Objective Implement new activities that increase a sense of satisfaction. Target Date: 2023-10-16 Frequency: Biweekly Progress: 90 Modality: individual Related Interventions 1. Develop a plan with the client to include activities that will increase his/her satisfaction, fulfill  his/her values, and improve the quality of his/her life. 3. Develop healthy interpersonal relationships that lead to the alleviation  and help prevent the relapse of depression. Objective Learn and implement problem-solving and decision-making skills. Target Date: 2023-10-16 Frequency: Biweekly Progress: 90 Modality: individual Related Interventions 1. Encourage in the client the development of a positive problem orientation in which problems  and solving them are viewed as a natural part of life and not something to be feared, despaired,  or avoided. 2. Conduct Problem-Solving Therapy  Implement new activities that increase a sense of satisfaction. Target Date: 2023-10-16 Frequency: Biweekly Progress: 90 Modality: individual Related Interventions 1. Develop a plan with the client to include activities that will increase his/her satisfaction, fulfill  his/her values, and improve the quality of his/her life. 3. Develop healthy interpersonal relationships that lead to the alleviation  and help prevent the relapse of depression. Objective Learn and implement problem-solving and decision-making skills. Target Date: 2023-10-16 Frequency: Biweekly Progress: 90 Modality: individual Related Interventions 1. Encourage in the client the development of a positive problem orientation in which problems  and solving them are viewed as a natural part of life and not something to be feared, despaired,  or avoided.  Objective Identify and replace thoughts and beliefs that support depression. Target Date: 2023-10-16 Frequency: Biweekly Progress: 90 Modality: individual Related Interventions 1. Explore and  restructure underlying assumptions and beliefs reflected in biased self-talk that  may put the client at risk for relapse or recurrence. 2. Facilitate and reinforce the client's shift from biased depressive self-talk and beliefs to realitybased cognitive messages that enhance self-confidence and increase adaptive actions (see  "Positive Self-Talk" in the Adult Psychotherapy Homework Planner by Bryn Gulling). 4. Enhance ability to effectively cope with the full variety of life's worries  and anxieties. Objective Identify, challenge, and replace biased, fearful self-talk with positive, realistic, and empowering selftalk. Target Date: 2023-10-16 Frequency: Biweekly Progress: 80 Modality: individual Related Interventions 1. Explore the client's schema and self-talk that mediate his/her fear response; assist him/her in  challenging the biases; replace the distorted messages with reality-based alternatives and  positive, realistic self-talk that will increase his/her self-confidence in coping with irrational  fears (see Cognitive Therapy of Anxiety Disorders by Alison Stalling). Objective Learn and implement problem-solving strategies for realistically addressing worries. Target Date: 2023-10-16 Frequency: Biweekly Progress: 60 Modality: individual Therapy 5. Recognize, accept, and cope with feelings of depression. 6. Resolve conflicted feelings and adapt to the new life circumstances. 7. Stabilize anxiety level while increasing ability to function on a daily  The patient is making progress and has approved this plan of treatment.  Theresa Dohrman G Mikael Debell, LCSW  [place] Counselor/Therapist Progress Note  Patient ID: Kristin Sparks, MRN: [id number],    Date: 11/23/2022  Time Spent: 60 minutes  Treatment  Type: Individual Therapy  Reported Symptoms: anxiety and depression  Mental Status Exam: Appearance:  Casual     Behavior: Appropriate  Motor: Normal  Speech/Language:  Normal Rate  Affect: Blunt  Mood: pleasant  Thought process: normal  Thought content:   WNL  Sensory/Perceptual disturbances:   WNL  Orientation: oriented to person, place, time/date, and situation  Attention: Good  Concentration: Good  Memory: WNL  Fund of knowledge:  Good  Insight:   Good  Judgment:  Good  Impulse Control: Good   Risk Assessment: Danger to Self:  No Self-injurious Behavior: No Danger to Others: No Duty to Warn:no Physical Aggression / Violence:No  Access to Firearms a concern: No  Gang Involvement:No   Subjective: The patient attended a face-to-face individual therapy session via video visit.  The patient gave verbal consent to have the session on video on WebEx.  The patient was in her home alone and the therapist was in the office.  The patient presents with a blunted affect and mood is pleasant.  The patient continues to make progress with cleaning out her house.  The patient reports that she got rid of all of the baseball cards that her husband had.  She did make some money on those which was a good result.  The patient states that she has some trips planned in the near future.  She says that she is going to [facility] and to [place].  She is doing a good job of cleaning out her house however, she has not made any decisions about moving at this point in time.  The patient is still taking care of her son's dog and I recommended that she can have a conversation with him about what he plans to do moving forward about his dog.  The patient states that she does not want to keep her but she has not done anything to make a decision about that yet.  The dog is very strong and has the potential for causing some major problems for her if she pulls her down.  Interventions: Cognitive Behavioral Therapy  and Assertiveness/Communication  Diagnosis:Generalized anxiety disorder  Depression, major, recurrent, in partial remission (HCC)  Adjustment disorder with mixed anxiety and depressed mood  Plan:  Client Abilities/Strengths  Intelligent, caring, motivated  Client Treatment Preferences  Outpatient individual therapy  Client Statement of Needs  " I need some help to deal with my situation", "I 'm not sure if I'm depressed or what?"  Treatment Level  Outpatient Individual therapy  Symptoms  Frustration and anxiety related to providing oversight and caretaking to an aging, ailing, and dependent  parent.: No Description Entered (Status: improved). Lack of energy.: No Description Entered (Status:  improved). Motor tension (e.g., restlessness, tiredness, shakiness, muscle tension).: No Description  Entered (Status: improved). Poor concentration and indecisiveness.: No Description Entered (Status:  improved). Social withdrawal.: No Description Entered (Status: improved).  Problems Addressed  Unipolar Depression, Phase Of Life Problems, Unipolar Depression, Anxiety, Unipolar Depression,  Phase Of Life Problems, Anxiety  Goals 1. Appropriately grieve the loss in order to normalize mood and to return  to previously adaptive level of functioning. 2. Balance life activities between consideration of others and development of own interests. Objective Apply problem-solving skills to current circumstances Target Date: 2023-10-16 Frequency: Biweekly Progress: 90 Modality: individual Related Interventions 1. Use modeling and  role-playing with the client to apply the problem-solving approach to his/her  current circumstances (or assign "Applying Problem-Solving to Interpersonal Conflict" from  the Adult Psychotherapy Homework Planner by [facility]); encourage implementation of action  plan, reinforcing success and redirecting for failure. Objective Implement new activities that increase a sense of  satisfaction. Target Date: 2023-10-16 Frequency: Biweekly Progress: 90 Modality: individual Related Interventions 1. Develop a plan with the client to include activities that will increase his/her satisfaction, fulfill  his/her values, and improve the quality of his/her life. 3. Develop healthy interpersonal relationships that lead to the alleviation  and help prevent the relapse of depression. Objective Learn and implement problem-solving and decision-making skills. Target Date: 2023-10-16 Frequency: Biweekly Progress: 90 Modality: individual Related Interventions 1. Encourage in the client the development of a positive problem orientation in which problems  and solving them are viewed as a natural part of life and not something to be feared, despaired,  or avoided. 2. Conduct Problem-Solving Therapy  Implement new activities that increase a sense of satisfaction. Target Date: 2023-10-16 Frequency: Biweekly Progress: 90 Modality: individual Related Interventions 1. Develop a plan with the client to include activities that will increase his/her satisfaction, fulfill  his/her values, and improve the quality of his/her life. 3. Develop healthy interpersonal relationships that lead to the alleviation  and help prevent the relapse of depression. Objective Learn and implement problem-solving and decision-making skills. Target Date: 2023-10-16 Frequency: Biweekly Progress: 90 Modality: individual Related Interventions 1. Encourage in the client the development of a positive problem orientation in which problems  and solving them are viewed as a natural part of life and not something to be feared, despaired,  or avoided.  Objective Identify and replace thoughts and beliefs that support depression. Target Date: 2023-10-16 Frequency: Biweekly Progress: 90 Modality: individual Related Interventions 1. Explore and restructure underlying assumptions and beliefs reflected in biased  self-talk that  may put the client at risk for relapse or recurrence. 2. Facilitate and reinforce the client's shift from biased depressive self-talk and beliefs to realitybased cognitive messages that enhance self-confidence and increase adaptive actions (see  "Positive Self-Talk" in the Adult Psychotherapy Homework Planner by Bryn Gulling). 4. Enhance ability to effectively cope with the full variety of life's worries  and anxieties. Objective Identify, challenge, and replace biased, fearful self-talk with positive, realistic, and empowering selftalk. Target Date: 2023-10-16 Frequency: Biweekly Progress: 80 Modality: individual Related Interventions 1. Explore the client's schema and self-talk that mediate his/her fear response; assist him/her in  challenging the biases; replace the distorted messages with reality-based alternatives and  positive, realistic self-talk that will increase his/her self-confidence in coping with irrational  fears (see Cognitive Therapy of Anxiety Disorders by Alison Stalling). Objective Learn and implement problem-solving strategies for realistically addressing worries. Target Date: 2023-10-16 Frequency: Biweekly Progress: 60 Modality: individual Therapy 5. Recognize, accept, and cope with feelings of depression. 6. Resolve conflicted feelings and adapt to the new life circumstances. 7. Stabilize anxiety level while increasing ability to function on a daily  The patient is making progress and has approved this plan of treatment.  Tong Pieczynski G Ermalee Mealy, LCSW

## 2022-12-07 ENCOUNTER — Ambulatory Visit (INDEPENDENT_AMBULATORY_CARE_PROVIDER_SITE_OTHER): Payer: Medicare Other | Admitting: Psychology

## 2022-12-07 DIAGNOSIS — F411 Generalized anxiety disorder: Secondary | ICD-10-CM | POA: Diagnosis not present

## 2022-12-07 DIAGNOSIS — F4323 Adjustment disorder with mixed anxiety and depressed mood: Secondary | ICD-10-CM

## 2022-12-07 DIAGNOSIS — F3341 Major depressive disorder, recurrent, in partial remission: Secondary | ICD-10-CM | POA: Diagnosis not present

## 2022-12-08 NOTE — Progress Notes (Addendum)
[facility] Counselor/Therapist Progress Note  Patient ID: MYELLE KALAMA, MRN: [id number],    Date: 12/07/2022  Time Spent: 60 minutes  Treatment Type: Individual Therapy  Reported Symptoms: anxiety and depression  Mental Status Exam: Appearance:  Casual     Behavior: Appropriate  Motor: Normal  Speech/Language:  Normal Rate  Affect: Blunt  Mood: pleasant  Thought process: normal  Thought content:   WNL  Sensory/Perceptual disturbances:   WNL  Orientation: oriented to person, place, time/date, and situation  Attention: Good  Concentration: Good  Memory: WNL  Fund of knowledge:  Good  Insight:   Good  Judgment:  Good  Impulse Control: Good   Risk Assessment: Danger to Self:  No Self-injurious Behavior: No Danger to Others: No Duty to Warn:no Physical Aggression / Violence:No  Access to Firearms a concern: No  Gang Involvement:No   Subjective: The patient attended a face-to-face individual therapy session via video visit.  The patient gave verbal consent to have the session on video on WebEx.  The patient was in her home alone and the therapist was in the office.  The patient presents with a blunted affect and mood is pleasant.  The patient states that she is continuing to do some cleaning out of her house.  She reports that she feels better now that she has done some of this work.  She talked some about having to deal with her brother who seems to have some mental illness and is trying to manipulate she and her sister.  We talked about how to set limits and what was healthy about that.  The patient still has not made any decisions about moving or what she wants to do moving forward we will continue to discuss this and get to a place where she can make a decision.  Interventions: Cognitive Behavioral Therapy and Assertiveness/Communication  Diagnosis:Generalized anxiety disorder  Depression, major, recurrent, in partial remission (HCC)  Adjustment  disorder with mixed anxiety and depressed mood  Plan:  Client Abilities/Strengths  Intelligent, caring, motivated  Client Treatment Preferences  Outpatient individual therapy  Client Statement of Needs  " I need some help to deal with my situation", "I 'm not sure if I'm depressed or what?"  Treatment Level  Outpatient Individual therapy  Symptoms  Frustration and anxiety related to providing oversight and caretaking to an aging, ailing, and dependent  parent.: No Description Entered (Status: improved). Lack of energy.: No Description Entered (Status:  improved). Motor tension (e.g., restlessness, tiredness, shakiness, muscle tension).: No Description  Entered (Status: improved). Poor concentration and indecisiveness.: No Description Entered (Status:  improved). Social withdrawal.: No Description Entered (Status: improved).  Problems Addressed  Unipolar Depression, Phase Of Life Problems, Unipolar Depression, Anxiety, Unipolar Depression,  Phase Of Life Problems, Anxiety  Goals 1. Appropriately grieve the loss in order to normalize mood and to return  to previously adaptive level of functioning. 2. Balance life activities between consideration of others and development of own interests. Objective Apply problem-solving skills to current circumstances Target Date: 2023-10-16 Frequency: Biweekly Progress: 90 Modality: individual Related Interventions 1. Use modeling and role-playing with the client to apply the problem-solving approach to his/her  current circumstances (or assign "Applying Problem-Solving to Interpersonal Conflict" from  the Adult Psychotherapy Homework Planner by [facility]); encourage implementation of action  plan, reinforcing success and redirecting for failure. Objective Implement new activities that increase a sense of satisfaction. Target Date: 2023-10-16 Frequency: Biweekly Progress: 90 Modality: individual Related Interventions 1.  Develop a plan with the  client to include activities that will increase his/her satisfaction, fulfill  his/her values, and improve the quality of his/her life. 3. Develop healthy interpersonal relationships that lead to the alleviation  and help prevent the relapse of depression. Objective Learn and implement problem-solving and decision-making skills. Target Date: 2023-10-16 Frequency: Biweekly Progress: 90 Modality: individual Related Interventions 1. Encourage in the client the development of a positive problem orientation in which problems  and solving them are viewed as a natural part of life and not something to be feared, despaired,  or avoided. 2. Conduct Problem-Solving Therapy  Implement new activities that increase a sense of satisfaction. Target Date: 2023-10-16 Frequency: Biweekly Progress: 90 Modality: individual Related Interventions 1. Develop a plan with the client to include activities that will increase his/her satisfaction, fulfill  his/her values, and improve the quality of his/her life. 3. Develop healthy interpersonal relationships that lead to the alleviation  and help prevent the relapse of depression. Objective Learn and implement problem-solving and decision-making skills. Target Date: 2023-10-16 Frequency: Biweekly Progress: 90 Modality: individual Related Interventions 1. Encourage in the client the development of a positive problem orientation in which problems  and solving them are viewed as a natural part of life and not something to be feared, despaired,  or avoided.  Objective Identify and replace thoughts and beliefs that support depression. Target Date: 2023-10-16 Frequency: Biweekly Progress: 90 Modality: individual Related Interventions 1. Explore and restructure underlying assumptions and beliefs reflected in biased self-talk that  may put the client at risk for relapse or recurrence. 2. Facilitate and reinforce the client's shift from biased depressive self-talk  and beliefs to realitybased cognitive messages that enhance self-confidence and increase adaptive actions (see  "Positive Self-Talk" in the Adult Psychotherapy Homework Planner by Bryn Gulling). 4. Enhance ability to effectively cope with the full variety of life's worries  and anxieties. Objective Identify, challenge, and replace biased, fearful self-talk with positive, realistic, and empowering selftalk. Target Date: 2023-10-16 Frequency: Biweekly Progress: 80 Modality: individual Related Interventions 1. Explore the client's schema and self-talk that mediate his/her fear response; assist him/her in  challenging the biases; replace the distorted messages with reality-based alternatives and  positive, realistic self-talk that will increase his/her self-confidence in coping with irrational  fears (see Cognitive Therapy of Anxiety Disorders by Alison Stalling). Objective Learn and implement problem-solving strategies for realistically addressing worries. Target Date: 2023-10-16 Frequency: Biweekly Progress: 60 Modality: individual Therapy 5. Recognize, accept, and cope with feelings of depression. 6. Resolve conflicted feelings and adapt to the new life circumstances. 7. Stabilize anxiety level while increasing ability to function on a daily  The patient is making progress and has approved this plan of treatment.  Jalan Fariss G Kaizen Ibsen, LCSW

## 2022-12-21 ENCOUNTER — Ambulatory Visit (INDEPENDENT_AMBULATORY_CARE_PROVIDER_SITE_OTHER): Payer: Medicare Other | Admitting: Psychology

## 2022-12-21 DIAGNOSIS — F3341 Major depressive disorder, recurrent, in partial remission: Secondary | ICD-10-CM | POA: Diagnosis not present

## 2022-12-21 DIAGNOSIS — F411 Generalized anxiety disorder: Secondary | ICD-10-CM

## 2022-12-21 DIAGNOSIS — F4323 Adjustment disorder with mixed anxiety and depressed mood: Secondary | ICD-10-CM | POA: Diagnosis not present

## 2022-12-21 NOTE — Progress Notes (Signed)
[facility] Counselor/Therapist Progress Note  Patient ID: Kristin Sparks, MRN: [id number],    Date: 12/21/2022  Time Spent: 60 minutes  Treatment Type: Individual Therapy  Reported Symptoms: anxiety and depression  Mental Status Exam: Appearance:  Casual     Behavior: Appropriate  Motor: Normal  Speech/Language:  Normal Rate  Affect: Blunt  Mood: pleasant  Thought process: normal  Thought content:   WNL  Sensory/Perceptual disturbances:   WNL  Orientation: oriented to person, place, time/date, and situation  Attention: Good  Concentration: Good  Memory: WNL  Fund of knowledge:  Good  Insight:   Good  Judgment:  Good  Impulse Control: Good   Risk Assessment: Danger to Self:  No Self-injurious Behavior: No Danger to Others: No Duty to Warn:no Physical Aggression / Violence:No  Access to Firearms a concern: No  Gang Involvement:No   Subjective: The patient attended a face-to-face individual therapy session via video visit.  The patient gave verbal consent to have the session on video on WebEx.  The patient was in her home alone and the therapist was in the office.  The patient presents with a blunted affect and mood is pleasant.  The patient reports that things have been going well.  She says that her step daughter and her husband may buy house a few doors down from her son.  We talked about her possibly ending up in [place] and she does not seem thrilled about the idea.  We talked about her possibly being able to buy something at the beach but we would just have to make sure that she had a support system set up.  The patient still not ready to make any kind of determination about that just yet.  In addition she says that her son and daughter-in-law are getting a new big dog and it seems that his other dog is going to end up with the patient.  The patient is frustrated by this but has not set any limits with her son.  She also talked about her brother and  sister and the dynamic that they have going on.  Her brother sounds like he is trying to get the patient or his other sister to take care of him financially.  I encouraged her to continue to set limits with him about that.  The patient does have a trip planned in the next few weeks to [facility] and I think this will be good for her.  We will continue to work with patient on making decisions about what she wants to do moving forward. Interventions: Cognitive Behavioral Therapy and Assertiveness/Communication  Diagnosis:Generalized anxiety disorder  Depression, major, recurrent, in partial remission (HCC)  Adjustment disorder with mixed anxiety and depressed mood  Plan:  Client Abilities/Strengths  Intelligent, caring, motivated  Client Treatment Preferences  Outpatient individual therapy  Client Statement of Needs  " I need some help to deal with my situation", "I 'm not sure if I'm depressed or what?"  Treatment Level  Outpatient Individual therapy  Symptoms  Frustration and anxiety related to providing oversight and caretaking to an aging, ailing, and dependent  parent.: No Description Entered (Status: improved). Lack of energy.: No Description Entered (Status:  improved). Motor tension (e.g., restlessness, tiredness, shakiness, muscle tension).: No Description  Entered (Status: improved). Poor concentration and indecisiveness.: No Description Entered (Status:  improved). Social withdrawal.: No Description Entered (Status: improved).  Problems Addressed  Unipolar Depression, Phase Of Life Problems, Unipolar Depression, Anxiety, Unipolar  Depression,  Phase Of Life Problems, Anxiety  Goals 1. Appropriately grieve the loss in order to normalize mood and to return  to previously adaptive level of functioning. 2. Balance life activities between consideration of others and development of own interests. Objective Apply problem-solving skills to current  circumstances Target Date: 2023-10-16 Frequency: Biweekly Progress: 90 Modality: individual Related Interventions 1. Use modeling and role-playing with the client to apply the problem-solving approach to his/her  current circumstances (or assign "Applying Problem-Solving to Interpersonal Conflict" from  the Adult Psychotherapy Homework Planner by [facility]); encourage implementation of action  plan, reinforcing success and redirecting for failure. Objective Implement new activities that increase a sense of satisfaction. Target Date: 2023-10-16 Frequency: Biweekly Progress: 90 Modality: individual Related Interventions 1. Develop a plan with the client to include activities that will increase his/her satisfaction, fulfill  his/her values, and improve the quality of his/her life. 3. Develop healthy interpersonal relationships that lead to the alleviation  and help prevent the relapse of depression. Objective Learn and implement problem-solving and decision-making skills. Target Date: 2023-10-16 Frequency: Biweekly Progress: 90 Modality: individual Related Interventions 1. Encourage in the client the development of a positive problem orientation in which problems  and solving them are viewed as a natural part of life and not something to be feared, despaired,  or avoided. 2. Conduct Problem-Solving Therapy  Implement new activities that increase a sense of satisfaction. Target Date: 2023-10-16 Frequency: Biweekly Progress: 90 Modality: individual Related Interventions 1. Develop a plan with the client to include activities that will increase his/her satisfaction, fulfill  his/her values, and improve the quality of his/her life. 3. Develop healthy interpersonal relationships that lead to the alleviation  and help prevent the relapse of depression. Objective Learn and implement problem-solving and decision-making skills. Target Date: 2023-10-16 Frequency: Biweekly Progress: 90 Modality:  individual Related Interventions 1. Encourage in the client the development of a positive problem orientation in which problems  and solving them are viewed as a natural part of life and not something to be feared, despaired,  or avoided.  Objective Identify and replace thoughts and beliefs that support depression. Target Date: 2023-10-16 Frequency: Biweekly Progress: 90 Modality: individual Related Interventions 1. Explore and restructure underlying assumptions and beliefs reflected in biased self-talk that  may put the client at risk for relapse or recurrence. 2. Facilitate and reinforce the client's shift from biased depressive self-talk and beliefs to realitybased cognitive messages that enhance self-confidence and increase adaptive actions (see  "Positive Self-Talk" in the Adult Psychotherapy Homework Planner by Bryn Gulling). 4. Enhance ability to effectively cope with the full variety of life's worries  and anxieties. Objective Identify, challenge, and replace biased, fearful self-talk with positive, realistic, and empowering selftalk. Target Date: 2023-10-16 Frequency: Biweekly Progress: 80 Modality: individual Related Interventions 1. Explore the client's schema and self-talk that mediate his/her fear response; assist him/her in  challenging the biases; replace the distorted messages with reality-based alternatives and  positive, realistic self-talk that will increase his/her self-confidence in coping with irrational  fears (see Cognitive Therapy of Anxiety Disorders by Alison Stalling). Objective Learn and implement problem-solving strategies for realistically addressing worries. Target Date: 2023-10-16 Frequency: Biweekly Progress: 60 Modality: individual Therapy 5. Recognize, accept, and cope with feelings of depression. 6. Resolve conflicted feelings and adapt to the new life circumstances. 7. Stabilize anxiety level while increasing ability to function on a daily  The  patient is making progress and has approved this plan of treatment.  Mikiyah Glasner G Delores Thelen, LCSW

## 2023-01-04 ENCOUNTER — Ambulatory Visit: Payer: Medicare Other | Admitting: Psychology

## 2023-01-18 ENCOUNTER — Ambulatory Visit (INDEPENDENT_AMBULATORY_CARE_PROVIDER_SITE_OTHER): Payer: Medicare Other | Admitting: Psychology

## 2023-01-18 DIAGNOSIS — F3341 Major depressive disorder, recurrent, in partial remission: Secondary | ICD-10-CM

## 2023-01-18 DIAGNOSIS — F411 Generalized anxiety disorder: Secondary | ICD-10-CM

## 2023-01-18 DIAGNOSIS — F4323 Adjustment disorder with mixed anxiety and depressed mood: Secondary | ICD-10-CM | POA: Diagnosis not present

## 2023-01-18 NOTE — Progress Notes (Signed)
[facility] Counselor/Therapist Progress Note  Patient ID: Kristin Sparks, MRN: [id number],    Date: 01/18/2023  Time Spent: 60 minutes  Treatment Type: Individual Therapy  Reported Symptoms: anxiety and depression  Mental Status Exam: Appearance:  Casual     Behavior: Appropriate  Motor: Normal  Speech/Language:  Normal Rate  Affect: Blunt  Mood: pleasant  Thought process: normal  Thought content:   WNL  Sensory/Perceptual disturbances:   WNL  Orientation: oriented to person, place, time/date, and situation  Attention: Good  Concentration: Good  Memory: WNL  Fund of knowledge:  Good  Insight:   Good  Judgment:  Good  Impulse Control: Good   Risk Assessment: Danger to Self:  No Self-injurious Behavior: No Danger to Others: No Duty to Warn:no Physical Aggression / Violence:No  Access to Firearms a concern: No  Gang Involvement:No   Subjective: The patient attended a face-to-face individual therapy session via video visit.  The patient gave verbal consent to have the session on video on WebEx.  The patient was in her home alone and the therapist was in the office.  The patient presents with a blunted affect and mood is pleasant.  The patient states that she went on a trip with her sister down to [place] and they had a lovely time.  The patient reports that since I have seen her last her son's dog did pull her down in the driveway at their house and she did not get her very much but it made her realize that she does not need to have that dog full time to be responsible for.  We talked about possible solutions for dealing with the situation and I recommended that she encourage her son to get a fence so that the dog can be over there more and she can go there and take care of the dog.  I encouraged her to go ahead and be a little more forceful in setting limits with her son as she has not done so thus far.  Interventions: Cognitive Behavioral Therapy and  Assertiveness/Communication  Diagnosis:Generalized anxiety disorder  Depression, major, recurrent, in partial remission  Adjustment disorder with mixed anxiety and depressed mood  Plan:  Client Abilities/Strengths  Intelligent, caring, motivated  Client Treatment Preferences  Outpatient individual therapy  Client Statement of Needs  " I need some help to deal with my situation", "I 'm not sure if I'm depressed or what?"  Treatment Level  Outpatient Individual therapy  Symptoms  Frustration and anxiety related to providing oversight and caretaking to an aging, ailing, and dependent  parent.: No Description Entered (Status: improved). Lack of energy.: No Description Entered (Status:  improved). Motor tension (e.g., restlessness, tiredness, shakiness, muscle tension).: No Description  Entered (Status: improved). Poor concentration and indecisiveness.: No Description Entered (Status:  improved). Social withdrawal.: No Description Entered (Status: improved).  Problems Addressed  Unipolar Depression, Phase Of Life Problems, Unipolar Depression, Anxiety, Unipolar Depression,  Phase Of Life Problems, Anxiety  Goals 1. Appropriately grieve the loss in order to normalize mood and to return  to previously adaptive level of functioning. 2. Balance life activities between consideration of others and development of own interests. Objective Apply problem-solving skills to current circumstances Target Date: 2023-10-16 Frequency: Biweekly Progress: 90 Modality: individual Related Interventions 1. Use modeling and role-playing with the client to apply the problem-solving approach to his/her  current circumstances (or assign "Applying Problem-Solving to Interpersonal Conflict" from  the Adult Psychotherapy Homework Planner by Stephannie Li); encourage  implementation of action  plan, reinforcing success and redirecting for failure. Objective Implement new activities that increase a sense of  satisfaction. Target Date: 2023-10-16 Frequency: Biweekly Progress: 90 Modality: individual Related Interventions 1. Develop a plan with the client to include activities that will increase his/her satisfaction, fulfill  his/her values, and improve the quality of his/her life. 3. Develop healthy interpersonal relationships that lead to the alleviation  and help prevent the relapse of depression. Objective Learn and implement problem-solving and decision-making skills. Target Date: 2023-10-16 Frequency: Biweekly Progress: 90 Modality: individual Related Interventions 1. Encourage in the client the development of a positive problem orientation in which problems  and solving them are viewed as a natural part of life and not something to be feared, despaired,  or avoided. 2. Conduct Problem-Solving Therapy  Implement new activities that increase a sense of satisfaction. Target Date: 2023-10-16 Frequency: Biweekly Progress: 90 Modality: individual Related Interventions 1. Develop a plan with the client to include activities that will increase his/her satisfaction, fulfill  his/her values, and improve the quality of his/her life. 3. Develop healthy interpersonal relationships that lead to the alleviation  and help prevent the relapse of depression. Objective Learn and implement problem-solving and decision-making skills. Target Date: 2023-10-16 Frequency: Biweekly Progress: 90 Modality: individual Related Interventions 1. Encourage in the client the development of a positive problem orientation in which problems  and solving them are viewed as a natural part of life and not something to be feared, despaired,  or avoided.  Objective Identify and replace thoughts and beliefs that support depression. Target Date: 2023-10-16 Frequency: Biweekly Progress: 90 Modality: individual Related Interventions 1. Explore and restructure underlying assumptions and beliefs reflected in biased  self-talk that  may put the client at risk for relapse or recurrence. 2. Facilitate and reinforce the client's shift from biased depressive self-talk and beliefs to realitybased cognitive messages that enhance self-confidence and increase adaptive actions (see  "Positive Self-Talk" in the Adult Psychotherapy Homework Planner by Stephannie Li). 4. Enhance ability to effectively cope with the full variety of life's worries  and anxieties. Objective Identify, challenge, and replace biased, fearful self-talk with positive, realistic, and empowering selftalk. Target Date: 2023-10-16 Frequency: Biweekly Progress: 80 Modality: individual Related Interventions 1. Explore the client's schema and self-talk that mediate his/her fear response; assist him/her in  challenging the biases; replace the distorted messages with reality-based alternatives and  positive, realistic self-talk that will increase his/her self-confidence in coping with irrational  fears (see Cognitive Therapy of Anxiety Disorders by Laurence Slate). Objective Learn and implement problem-solving strategies for realistically addressing worries. Target Date: 2023-10-16 Frequency: Biweekly Progress: 60 Modality: individual Therapy 5. Recognize, accept, and cope with feelings of depression. 6. Resolve conflicted feelings and adapt to the new life circumstances. 7. Stabilize anxiety level while increasing ability to function on a daily  The patient is making progress and has approved this plan of treatment.  Shainna Faux G Micheal Sheen, LCSW

## 2023-02-01 ENCOUNTER — Ambulatory Visit (INDEPENDENT_AMBULATORY_CARE_PROVIDER_SITE_OTHER): Payer: Medicare Other | Admitting: Psychology

## 2023-02-01 DIAGNOSIS — F3341 Major depressive disorder, recurrent, in partial remission: Secondary | ICD-10-CM | POA: Diagnosis not present

## 2023-02-01 DIAGNOSIS — F4323 Adjustment disorder with mixed anxiety and depressed mood: Secondary | ICD-10-CM

## 2023-02-01 DIAGNOSIS — F411 Generalized anxiety disorder: Secondary | ICD-10-CM | POA: Diagnosis not present

## 2023-02-01 NOTE — Progress Notes (Signed)
                Sherley Mckenney G Greene Diodato, LCSW 

## 2023-02-15 ENCOUNTER — Ambulatory Visit (INDEPENDENT_AMBULATORY_CARE_PROVIDER_SITE_OTHER): Payer: Medicare Other | Admitting: Psychology

## 2023-02-15 DIAGNOSIS — F4323 Adjustment disorder with mixed anxiety and depressed mood: Secondary | ICD-10-CM

## 2023-02-15 DIAGNOSIS — F3341 Major depressive disorder, recurrent, in partial remission: Secondary | ICD-10-CM | POA: Diagnosis not present

## 2023-02-15 DIAGNOSIS — F411 Generalized anxiety disorder: Secondary | ICD-10-CM

## 2023-02-15 NOTE — Progress Notes (Signed)
                Wilder Kurowski G Effie Wahlert, LCSW 

## 2023-03-01 ENCOUNTER — Ambulatory Visit (INDEPENDENT_AMBULATORY_CARE_PROVIDER_SITE_OTHER): Payer: Medicare Other | Admitting: Psychology

## 2023-03-01 DIAGNOSIS — F4323 Adjustment disorder with mixed anxiety and depressed mood: Secondary | ICD-10-CM

## 2023-03-01 DIAGNOSIS — F3341 Major depressive disorder, recurrent, in partial remission: Secondary | ICD-10-CM | POA: Diagnosis not present

## 2023-03-01 DIAGNOSIS — F411 Generalized anxiety disorder: Secondary | ICD-10-CM | POA: Diagnosis not present

## 2023-03-01 NOTE — Progress Notes (Signed)
                Martin Smeal G Gerod Caligiuri, LCSW 

## 2023-03-15 ENCOUNTER — Ambulatory Visit (INDEPENDENT_AMBULATORY_CARE_PROVIDER_SITE_OTHER): Payer: Medicare Other | Admitting: Psychology

## 2023-03-15 DIAGNOSIS — F3341 Major depressive disorder, recurrent, in partial remission: Secondary | ICD-10-CM | POA: Diagnosis not present

## 2023-03-15 DIAGNOSIS — F4323 Adjustment disorder with mixed anxiety and depressed mood: Secondary | ICD-10-CM

## 2023-03-15 DIAGNOSIS — F411 Generalized anxiety disorder: Secondary | ICD-10-CM

## 2023-03-15 NOTE — Progress Notes (Addendum)
[facility] Counselor/Therapist Progress Note  Patient ID: Kristin Sparks, MRN: [id number],    Date: 03/15/2023  Time Spent: 45 minutes  Time in: 9:15 Time out: 10:00  Treatment Type: Individual Therapy  Reported Symptoms: anxiety and depression  Mental Status Exam: Appearance:  Casual     Behavior: Appropriate  Motor: Normal  Speech/Language:  Normal Rate  Affect: Blunt  Mood: pleasant  Thought process: normal  Thought content:   WNL  Sensory/Perceptual disturbances:   WNL  Orientation: oriented to person, place, time/date, and situation  Attention: Good  Concentration: Good  Memory: WNL  Fund of knowledge:  Good  Insight:   Good  Judgment:  Good  Impulse Control: Good   Risk Assessment: Danger to Self:  No Self-injurious Behavior: No Danger to Others: No Duty to Warn:no Physical Aggression / Violence:No  Access to Firearms a concern: No  Gang Involvement:No   Subjective: The patient attended a face-to-face individual therapy session via video visit.  The patient gave verbal consent to have the session on video on caregility .  The patient was in her home alone and the therapist was in the office.  The patient presents with a blunted affect and mood is anxious.  The patient reports that she recently got back from [place].  She states that she had a very nice time.  She did not seem very enthusiastic about having made the trip.  We talked about her decreasing her medications and I am not sure that has been a good thing for her.  I have noticed that she has been much more anxious and somewhat depressed.  The patient is talked about being frustrated because she feels like she is a part of everyone else's life but she does not have a life of her own.  Again we talked about her having to make a decision to develop a life of her own and she has not done that she just continues to wait for it to come to her.  We talked about her thinking about over the next few  weeks whether she wants to figure out a way to have purpose and think about what she might be interested in doing. Interventions: Cognitive Behavioral Therapy and Assertiveness/Communication  Diagnosis:Generalized anxiety disorder  Depression, major, recurrent, in partial remission (HCC)  Adjustment disorder with mixed anxiety and depressed mood  Plan:  Client Abilities/Strengths  Intelligent, caring, motivated  Client Treatment Preferences  Outpatient individual therapy  Client Statement of Needs  " I need some help to deal with my situation", "I 'm not sure if I'm depressed or what?"  Treatment Level  Outpatient Individual therapy  Symptoms  Frustration and anxiety related to providing oversight and caretaking to an aging, ailing, and dependent  parent.: No Description Entered (Status: improved). Lack of energy.: No Description Entered (Status:  improved). Motor tension (e.g., restlessness, tiredness, shakiness, muscle tension).: No Description  Entered (Status: improved). Poor concentration and indecisiveness.: No Description Entered (Status:  improved). Social withdrawal.: No Description Entered (Status: improved).  Problems Addressed  Unipolar Depression, Phase Of Life Problems, Unipolar Depression, Anxiety, Unipolar Depression,  Phase Of Life Problems, Anxiety  Goals 1. Appropriately grieve the loss in order to normalize mood and to return  to previously adaptive level of functioning. 2. Balance life activities between consideration of others and development of own interests. Objective Apply problem-solving skills to current circumstances Target Date: 2023-10-16 Frequency: Biweekly Progress: 90 Modality: individual Related Interventions 1. Use modeling and role-playing with the  client to apply the problem-solving approach to his/her  current circumstances (or assign "Applying Problem-Solving to Interpersonal Conflict" from  the Adult Psychotherapy Homework Planner by  [facility]); encourage implementation of action  plan, reinforcing success and redirecting for failure. Objective Implement new activities that increase a sense of satisfaction. Target Date: 2023-10-16 Frequency: Biweekly Progress: 90 Modality: individual Related Interventions 1. Develop a plan with the client to include activities that will increase his/her satisfaction, fulfill  his/her values, and improve the quality of his/her life. 3. Develop healthy interpersonal relationships that lead to the alleviation  and help prevent the relapse of depression. Objective Learn and implement problem-solving and decision-making skills. Target Date: 2023-10-16 Frequency: Biweekly Progress: 90 Modality: individual Related Interventions 1. Encourage in the client the development of a positive problem orientation in which problems  and solving them are viewed as a natural part of life and not something to be feared, despaired,  or avoided. 2. Conduct Problem-Solving Therapy  Implement new activities that increase a sense of satisfaction. Target Date: 2023-10-16 Frequency: Biweekly Progress: 90 Modality: individual Related Interventions 1. Develop a plan with the client to include activities that will increase his/her satisfaction, fulfill  his/her values, and improve the quality of his/her life. 3. Develop healthy interpersonal relationships that lead to the alleviation  and help prevent the relapse of depression. Objective Learn and implement problem-solving and decision-making skills. Target Date: 2023-10-16 Frequency: Biweekly Progress: 90 Modality: individual Related Interventions 1. Encourage in the client the development of a positive problem orientation in which problems  and solving them are viewed as a natural part of life and not something to be feared, despaired,  or avoided.  Objective Identify and replace thoughts and beliefs that support depression. Target Date: 2023-10-16  Frequency: Biweekly Progress: 90 Modality: individual Related Interventions 1. Explore and restructure underlying assumptions and beliefs reflected in biased self-talk that  may put the client at risk for relapse or recurrence. 2. Facilitate and reinforce the client's shift from biased depressive self-talk and beliefs to realitybased cognitive messages that enhance self-confidence and increase adaptive actions (see  "Positive Self-Talk" in the Adult Psychotherapy Homework Planner by Stephannie Li). 4. Enhance ability to effectively cope with the full variety of life's worries  and anxieties. Objective Identify, challenge, and replace biased, fearful self-talk with positive, realistic, and empowering selftalk. Target Date: 2023-10-16 Frequency: Biweekly Progress: 80 Modality: individual Related Interventions 1. Explore the client's schema and self-talk that mediate his/her fear response; assist him/her in  challenging the biases; replace the distorted messages with reality-based alternatives and  positive, realistic self-talk that will increase his/her self-confidence in coping with irrational  fears (see Cognitive Therapy of Anxiety Disorders by Laurence Slate). Objective Learn and implement problem-solving strategies for realistically addressing worries. Target Date: 2023-10-16 Frequency: Biweekly Progress: 60 Modality: individual Therapy 5. Recognize, accept, and cope with feelings of depression. 6. Resolve conflicted feelings and adapt to the new life circumstances. 7. Stabilize anxiety level while increasing ability to function on a daily  The patient is making progress and has approved this plan of treatment.  Zsazsa Bahena G Windell Musson, LCSW

## 2023-03-28 ENCOUNTER — Other Ambulatory Visit: Payer: Self-pay | Admitting: Psychiatry

## 2023-03-28 DIAGNOSIS — F3341 Major depressive disorder, recurrent, in partial remission: Secondary | ICD-10-CM

## 2023-03-28 DIAGNOSIS — F411 Generalized anxiety disorder: Secondary | ICD-10-CM

## 2023-03-29 ENCOUNTER — Ambulatory Visit (INDEPENDENT_AMBULATORY_CARE_PROVIDER_SITE_OTHER): Payer: Medicare Other | Admitting: Psychology

## 2023-03-29 DIAGNOSIS — F411 Generalized anxiety disorder: Secondary | ICD-10-CM

## 2023-03-29 DIAGNOSIS — F3341 Major depressive disorder, recurrent, in partial remission: Secondary | ICD-10-CM

## 2023-03-29 DIAGNOSIS — F4323 Adjustment disorder with mixed anxiety and depressed mood: Secondary | ICD-10-CM

## 2023-03-29 NOTE — Progress Notes (Signed)
                Robynne Roat G Charlie Seda, LCSW 

## 2023-04-12 ENCOUNTER — Ambulatory Visit (INDEPENDENT_AMBULATORY_CARE_PROVIDER_SITE_OTHER): Payer: Medicare Other | Admitting: Psychology

## 2023-04-12 DIAGNOSIS — F3341 Major depressive disorder, recurrent, in partial remission: Secondary | ICD-10-CM | POA: Diagnosis not present

## 2023-04-12 DIAGNOSIS — F411 Generalized anxiety disorder: Secondary | ICD-10-CM | POA: Diagnosis not present

## 2023-04-12 DIAGNOSIS — F4323 Adjustment disorder with mixed anxiety and depressed mood: Secondary | ICD-10-CM

## 2023-04-12 NOTE — Progress Notes (Unsigned)
                Bambi G Cottle, LCSW 

## 2023-04-13 ENCOUNTER — Encounter: Payer: Self-pay | Admitting: Psychiatry

## 2023-04-13 ENCOUNTER — Telehealth (INDEPENDENT_AMBULATORY_CARE_PROVIDER_SITE_OTHER): Payer: Medicare Other | Admitting: Psychiatry

## 2023-04-13 VITALS — BP 122/80 | HR 62 | Wt 138.0 lb

## 2023-04-13 DIAGNOSIS — F411 Generalized anxiety disorder: Secondary | ICD-10-CM

## 2023-04-13 DIAGNOSIS — F330 Major depressive disorder, recurrent, mild: Secondary | ICD-10-CM | POA: Diagnosis not present

## 2023-04-13 DIAGNOSIS — G4700 Insomnia, unspecified: Secondary | ICD-10-CM | POA: Diagnosis not present

## 2023-04-13 MED ORDER — DULOXETINE HCL 20 MG PO CPEP
40.0000 mg | ORAL_CAPSULE | Freq: Every morning | ORAL | 0 refills | Status: DC
Start: 2023-04-13 — End: 2023-06-07

## 2023-04-13 MED ORDER — BUSPIRONE HCL 15 MG PO TABS
15.0000 mg | ORAL_TABLET | Freq: Every morning | ORAL | 1 refills | Status: DC
Start: 2023-04-13 — End: 2023-08-23

## 2023-04-13 NOTE — Progress Notes (Signed)
Kristin Sparks [id number] 05/21/1956 67 y.o.  Virtual Visit via Video Note  I connected with pt @ on 04/13/23 at 10:00 AM EDT by a video enabled telemedicine application and verified that I am speaking with the correct person using two identifiers.   I discussed the limitations of evaluation and management by telemedicine and the availability of in person appointments. The patient expressed understanding and agreed to proceed.  I discussed the assessment and treatment plan with the patient. The patient was provided an opportunity to ask questions and all were answered. The patient agreed with the plan and demonstrated an understanding of the instructions.   The patient was advised to call back or seek an in-person evaluation if the symptoms worsen or if the condition fails to improve as anticipated.  I provided 31 minutes of non-face-to-face time during this encounter.  The patient was located at home.  The provider was located at [facility].   Kristin Sparks, PMHNP   Subjective:   Patient ID:  Kristin Sparks is a 67 y.o. (DOB 1956/05/12) female.  Chief Complaint:  Chief Complaint  Patient presents with   Depression    Depression        Kristin Sparks presents for follow-up of depression, anxiety, and insomnia. She reports, "I've been doing ok." She reports that she continues to see therapist, who has commented that "seems to have more ups and downs" with lower dose of Cymbalta and some increased depression. She reports that she notices some possible worsening in depression. She reports that she occasionally feels "kind of stuck." She reports periods of lower energy and motivation. Denies ever staying in bed due to depression- "sometimes I will just lie around on the couch." She reports diminished enjoyment in things- "sometimes I feel like I am just going through the motions." Occasional sad mood. She reports that she has been somewhat withdrawn. She reports getting  together less with friends. Sleeping well. She uses Trazodone prn about every 1-2 months. She reports that her anxiety has been "ok." Denies excessive worry. Has not needed prn medications for anxiety recently. She reports that her appetite has been ok. Recent intentional 9 lb weight loss. Denies SI.   Sister is 4 years older and is also widowed. She continues to care for son's 75 lb dog.   She reports that she recently filled Cymbalta 30 mg capsules at home.  Past medication trials: Sertraline-diarrhea.  Felt calmer. Lexapro-prescribed for hot flashes.  Had increased anxiety while taking. Effexor XR-some benefit and then was less effective.  Signs and symptoms inadequately controlled at 75 mg and unable to tolerate higher doses. Cymbalta Buspar Propanolol Ativan Hydroxyzine Lyrica Gabapentin-did not help with neuropathy  Review of Systems:  Review of Systems  Musculoskeletal:  Positive for arthralgias. Negative for gait problem.  Neurological:  Negative for tremors.  Psychiatric/Behavioral:  Positive for depression.        Please refer to HPI    Medications: I have reviewed the patient's current medications.  Current Outpatient Medications  Medication Sig Dispense Refill   acetaminophen (TYLENOL) 650 MG CR tablet Take 650 mg by mouth every 8 (eight) hours as needed for pain.     albuterol (PROVENTIL HFA;VENTOLIN HFA) 108 (90 Base) MCG/ACT inhaler Inhale into the lungs.     calcium-vitamin D (OSCAL-500) 500-400 MG-UNIT tablet Take by mouth.     levocetirizine (XYZAL) 5 MG tablet Take 5 mg by mouth daily as needed.      lovastatin (MEVACOR)  20 MG tablet Take 20 mg by mouth once a week.      meloxicam (MOBIC) 7.5 MG tablet Take 7.5 mg by mouth as needed.     Omega-3 1000 MG CAPS Take by mouth.     traZODone (DESYREL) 50 MG tablet Take 1/2-2 tabs po QHS prn insomnia 60 tablet 2   busPIRone (BUSPAR) 15 MG tablet Take 1 tablet (15 mg total) by mouth every morning. 90 tablet 1    DULoxetine (CYMBALTA) 20 MG capsule Take 2 capsules (40 mg total) by mouth every morning. 180 capsule 0   Fluticasone-Salmeterol (ADVAIR DISKUS) 250-50 MCG/DOSE AEPB Inhale into the lungs. (Patient not taking: Reported on 04/13/2023)     hydrOXYzine (ATARAX/VISTARIL) 10 MG tablet Take 1 tablet (10 mg total) by mouth at bedtime as needed for up to 30 days. 30 tablet 2   pregabalin (LYRICA) 75 MG capsule Take 1 capsule (75 mg total) by mouth every evening. (Patient not taking: Reported on 07/07/2022) 90 capsule 1   No current facility-administered medications for this visit.    Medication Side Effects: None  Allergies:  Allergies  Allergen Reactions   Latex    Sulfa Antibiotics Rash    Past Medical History:  Diagnosis Date   Allergic rhinitis    Asthma    Elevated cholesterol    Neuropathy    Osteoarthritis     Family History  Problem Relation Age of Onset   Alcohol abuse Father    Depression Sister    Breast cancer Maternal Grandmother     Social History   Socioeconomic History   Marital status: Widowed    Spouse name: Not on file   Number of children: Not on file   Years of education: Not on file   Highest education level: Not on file  Occupational History   Not on file  Tobacco Use   Smoking status: Never   Smokeless tobacco: Never  Substance and Sexual Activity   Alcohol use: Not on file   Drug use: Not on file   Sexual activity: Not on file  Other Topics Concern   Not on file  Social History Narrative   Not on file   Social Determinants of Health   Financial Resource Strain: Not on file  Food Insecurity: Not on file  Transportation Needs: Not on file  Physical Activity: Not on file  Stress: Not on file  Social Connections: Not on file  Intimate Partner Violence: Not on file    Past Medical History, Surgical history, Social history, and Family history were reviewed and updated as appropriate.   Please see review of systems for further details on  the patient's review from today.   Objective:   Physical Exam:  BP 122/80   Pulse 62   Wt 138 lb (62.6 kg)   BMI 23.69 kg/m   Physical Exam Neurological:     Mental Status: She is alert and oriented to person, place, and time.     Cranial Nerves: No dysarthria.  Psychiatric:        Attention and Perception: Attention and perception normal.        Mood and Affect: Mood is depressed.        Speech: Speech normal.        Behavior: Behavior is cooperative.        Thought Content: Thought content normal. Thought content is not paranoid or delusional. Thought content does not include homicidal or suicidal ideation. Thought content does not include homicidal  or suicidal plan.        Cognition and Memory: Cognition and memory normal.        Judgment: Judgment normal.     Comments: Insight intact     Lab Review:  No results found for: "NA", "K", "CL", "CO2", "GLUCOSE", "BUN", "CREATININE", "CALCIUM", "PROT", "ALBUMIN", "AST", "ALT", "ALKPHOS", "BILITOT", "GFRNONAA", "GFRAA"  No results found for: "WBC", "RBC", "HGB", "HCT", "PLT", "MCV", "MCH", "MCHC", "RDW", "LYMPHSABS", "MONOABS", "EOSABS", "BASOSABS"  No results found for: "POCLITH", "LITHIUM"   No results found for: "PHENYTOIN", "PHENOBARB", "VALPROATE", "CBMZ"   .res Assessment: Plan:    Discussed recent depressive symptoms that she and her therapist have noticed. Discussed that depressive symptoms have been less in the past when taking higher doses of Cymbalta. Discussed option of increasing Cymbalta to 40 mg or 60 mg daily. Pt agrees to increase in Cymbalta to 40 mg daily to improve depressive symptoms.  Discussed continuing Buspar 15 mg daily for anxiety.  Will continue trazodone 50 mg 1/2-2 tabs po at bedtime prn insomnia.  Will discontinue Propranolol prn and Lorazepam prn due to non-use. Discussed that medications could be re-started in the future if needed.  Recommend continuing therapy with Bambi Cottle, LCSW.  Pt to  follow-up in 3 months or sooner if clinically indicated.  Patient advised to contact office with any questions, adverse effects, or acute worsening in signs and symptoms.   Twilla was seen today for depression.  Diagnoses and all orders for this visit:  Mild episode of recurrent major depressive disorder (HCC) -     DULoxetine (CYMBALTA) 20 MG capsule; Take 2 capsules (40 mg total) by mouth every morning.  Generalized anxiety disorder -     DULoxetine (CYMBALTA) 20 MG capsule; Take 2 capsules (40 mg total) by mouth every morning. -     busPIRone (BUSPAR) 15 MG tablet; Take 1 tablet (15 mg total) by mouth every morning.  Insomnia, unspecified type     Please see After Visit Summary for patient specific instructions.  Future Appointments  Date Time Provider Department Center  04/26/2023  9:00 AM Cottle, Lynnell Dike, LCSW [facility] None  05/10/2023  9:00 AM Cottle, Bambi G, LCSW [facility] None  05/24/2023  9:00 AM Cottle, Bambi G, LCSW [facility] None  06/07/2023  9:00 AM Cottle, Bambi G, LCSW [facility] None  06/21/2023  9:00 AM Cottle, Bambi G, LCSW [facility] None    No orders of the defined types were placed in this encounter.     -------------------------------

## 2023-04-26 ENCOUNTER — Ambulatory Visit (INDEPENDENT_AMBULATORY_CARE_PROVIDER_SITE_OTHER): Payer: Medicare Other | Admitting: Psychology

## 2023-04-26 DIAGNOSIS — F411 Generalized anxiety disorder: Secondary | ICD-10-CM | POA: Diagnosis not present

## 2023-04-26 DIAGNOSIS — F330 Major depressive disorder, recurrent, mild: Secondary | ICD-10-CM | POA: Diagnosis not present

## 2023-04-26 NOTE — Progress Notes (Unsigned)
                Bambi G Cottle, LCSW 

## 2023-05-10 ENCOUNTER — Ambulatory Visit (INDEPENDENT_AMBULATORY_CARE_PROVIDER_SITE_OTHER): Payer: Medicare Other | Admitting: Psychology

## 2023-05-10 DIAGNOSIS — F411 Generalized anxiety disorder: Secondary | ICD-10-CM | POA: Diagnosis not present

## 2023-05-10 DIAGNOSIS — F330 Major depressive disorder, recurrent, mild: Secondary | ICD-10-CM | POA: Diagnosis not present

## 2023-05-10 NOTE — Progress Notes (Unsigned)
                Bambi G Cottle, LCSW 

## 2023-05-24 ENCOUNTER — Ambulatory Visit: Payer: Medicare Other | Admitting: Psychology

## 2023-06-07 ENCOUNTER — Ambulatory Visit (INDEPENDENT_AMBULATORY_CARE_PROVIDER_SITE_OTHER): Payer: Medicare Other | Admitting: Psychology

## 2023-06-07 ENCOUNTER — Other Ambulatory Visit: Payer: Self-pay | Admitting: Psychiatry

## 2023-06-07 DIAGNOSIS — F411 Generalized anxiety disorder: Secondary | ICD-10-CM

## 2023-06-07 DIAGNOSIS — F330 Major depressive disorder, recurrent, mild: Secondary | ICD-10-CM

## 2023-06-07 NOTE — Progress Notes (Signed)
[facility] Counselor/Therapist Progress Note  Patient ID: Kristin Sparks, MRN: [id number],    Date: 09/102024  Time Spent: 55  minutes  Time in:  9:02 Time out: 9:57  Treatment Type: Individual Therapy  Reported Symptoms: anxiety and depression  Mental Status Exam: Appearance:  Casual     Behavior: Appropriate  Motor: Normal  Speech/Language:  Normal Rate  Affect: Blunt  Mood: pleasant  Thought process: normal  Thought content:   WNL  Sensory/Perceptual disturbances:   WNL  Orientation: oriented to person, place, time/date, and situation  Attention: Good  Concentration: Good  Memory: WNL  Fund of knowledge:  Good  Insight:   Good  Judgment:  Good  Impulse Control: Good   Risk Assessment: Danger to Self:  No Self-injurious Behavior: No Danger to Others: No Duty to Warn:no Physical Aggression / Violence:No  Access to Firearms a concern: No  Gang Involvement:No   Subjective: The patient attended a face-to-face individual therapy session via video visit.  The patient gave verbal consent to have the session on video on caregility and the patient is aware of the limitations of telehealth..  The patient was in her  home alone and the therapist was in the office.  The patient presents with a blunted affect and mood is pleasant.  The patient reports that she went to the beach and had a wonderful vacation.  She reports that she has been doing a lot of exercise and that she has an appointment with a sports medicine doctor that I referred her to tomorrow.  We talked about things to discuss with him around her exercise habits and her aging.  The patient also has a trip planned starting this weekend to [place] and she is very excited about that.  The patient is making some progress in doing some things and stepping out of her comfort zone.  We still need to continue to talk about her wanting to move to a different location moving forward.  She has a tendency to  procrastinate because of her anxiety.  Provided supportive therapy today and cognitive behavioral therapy as well as some problem solving.  Interventions: Cognitive Behavioral Therapy and Assertiveness/Communication, problem solving  Diagnosis:Mild episode of recurrent major depressive disorder (HCC)  Generalized anxiety disorder  Plan:  Client Abilities/Strengths  Intelligent, caring, motivated  Client Treatment Preferences  Outpatient individual therapy  Client Statement of Needs  " I need some help to deal with my situation", "I 'm not sure if I'm depressed or what?"  Treatment Level  Outpatient Individual therapy  Symptoms  Frustration and anxiety related to providing oversight and caretaking to an aging, ailing, and dependent  parent.: No Description Entered (Status: improved). Lack of energy.: No Description Entered (Status:  improved). Motor tension (e.g., restlessness, tiredness, shakiness, muscle tension).: No Description  Entered (Status: improved). Poor concentration and indecisiveness.: No Description Entered (Status:  improved). Social withdrawal.: No Description Entered (Status: improved).  Problems Addressed  Unipolar Depression, Phase Of Life Problems, Unipolar Depression, Anxiety, Unipolar Depression,  Phase Of Life Problems, Anxiety  Goals 1. Appropriately grieve the loss in order to normalize mood and to return  to previously adaptive level of functioning. 2. Balance life activities between consideration of others and development of own interests. Objective Apply problem-solving skills to current circumstances Target Date: 2023-10-16 Frequency: Biweekly Progress: 90 Modality: individual Related Interventions 1. Use modeling and role-playing with the client to apply the problem-solving approach to his/her  current circumstances (or assign "Applying Problem-Solving  to Interpersonal Conflict" from  the Adult Psychotherapy Homework Planner by [facility]); encourage  implementation of action  plan, reinforcing success and redirecting for failure. Objective Implement new activities that increase a sense of satisfaction. Target Date: 2023-10-16 Frequency: Biweekly Progress: 90 Modality: individual Related Interventions 1. Develop a plan with the client to include activities that will increase his/her satisfaction, fulfill  his/her values, and improve the quality of his/her life. 3. Develop healthy interpersonal relationships that lead to the alleviation  and help prevent the relapse of depression. Objective Learn and implement problem-solving and decision-making skills. Target Date: 2023-10-16 Frequency: Biweekly Progress: 90 Modality: individual Related Interventions 1. Encourage in the client the development of a positive problem orientation in which problems  and solving them are viewed as a natural part of life and not something to be feared, despaired,  or avoided. 2. Conduct Problem-Solving Therapy  Implement new activities that increase a sense of satisfaction. Target Date: 2023-10-16 Frequency: Biweekly Progress: 90 Modality: individual Related Interventions 1. Develop a plan with the client to include activities that will increase his/her satisfaction, fulfill  his/her values, and improve the quality of his/her life. 3. Develop healthy interpersonal relationships that lead to the alleviation  and help prevent the relapse of depression. Objective Learn and implement problem-solving and decision-making skills. Target Date: 2023-10-16 Frequency: Biweekly Progress: 90 Modality: individual Related Interventions 1. Encourage in the client the development of a positive problem orientation in which problems  and solving them are viewed as a natural part of life and not something to be feared, despaired,  or avoided.  Objective Identify and replace thoughts and beliefs that support depression. Target Date: 2023-10-16 Frequency:  Biweekly Progress: 90 Modality: individual Related Interventions 1. Explore and restructure underlying assumptions and beliefs reflected in biased self-talk that  may put the client at risk for relapse or recurrence. 2. Facilitate and reinforce the client's shift from biased depressive self-talk and beliefs to realitybased cognitive messages that enhance self-confidence and increase adaptive actions (see  "Positive Self-Talk" in the Adult Psychotherapy Homework Planner by Stephannie Li). 4. Enhance ability to effectively cope with the full variety of life's worries  and anxieties. Objective Identify, challenge, and replace biased, fearful self-talk with positive, realistic, and empowering selftalk. Target Date: 2023-10-16 Frequency: Biweekly Progress: 80 Modality: individual Related Interventions 1. Explore the client's schema and self-talk that mediate his/her fear response; assist him/her in  challenging the biases; replace the distorted messages with reality-based alternatives and  positive, realistic self-talk that will increase his/her self-confidence in coping with irrational  fears (see Cognitive Therapy of Anxiety Disorders by Laurence Slate). Objective Learn and implement problem-solving strategies for realistically addressing worries. Target Date: 2023-10-16 Frequency: Biweekly Progress: 60 Modality: individual Therapy 5. Recognize, accept, and cope with feelings of depression. 6. Resolve conflicted feelings and adapt to the new life circumstances. 7. Stabilize anxiety level while increasing ability to function on a daily  The patient is making progress and has approved this plan of treatment.  Martie Muhlbauer G Roshan Salamon, LCSW

## 2023-06-07 NOTE — Progress Notes (Unsigned)
[name] [address] [place] [address] Phone: [phone] Subjective:   [name], am serving as a scribe for Dr. Antoine Primas.  I'm seeing this patient by the request  of:  Hande, Vishwanath, MD  CC: Left knee and left elbow pain  NGE:[id number]  Kristin Sparks is a 67 y.o. female coming in with complaint of L knee and L elbow pain. Patient states that she has osteoarthritis. Was seen in [facility] by an ortho in [place]. Had xray last November. Likes to run and wants to keep doing so. Runs 3x a week doing 3 miles. Knee pain has increased in April. Most of her pain is over medial aspect. Had cortisone injection and meloxicam in November. Since April she is using meloxicam more often than not. Wears compression wrap from [employer].   Also having L elbow pain over lateral epicondyle. Noticeable swelling. Is doing some weights at the gym but is unsure what caused flare up over the summer. Pain is improving but is still present.        Past Medical History:  Diagnosis Date   Allergic rhinitis    Asthma    Elevated cholesterol    Neuropathy    Osteoarthritis    Past Surgical History:  Procedure Laterality Date   NASAL SINUS SURGERY     OVARIAN CYST REMOVAL     Social History   Socioeconomic History   Marital status: Widowed    Spouse name: Not on file   Number of children: Not on file   Years of education: Not on file   Highest education level: Not on file  Occupational History   Not on file  Tobacco Use   Smoking status: Never   Smokeless tobacco: Never  Substance and Sexual Activity   Alcohol use: Not on file   Drug use: Not on file   Sexual activity: Not on file  Other Topics Concern   Not on file  Social History Narrative   Not on file   Social Determinants of Health   Financial Resource Strain: Not on file  Food Insecurity: Not on file  Transportation Needs: Not on file  Physical Activity: Not on  file  Stress: Not on file  Social Connections: Not on file   Allergies  Allergen Reactions   Latex    Sulfa Antibiotics Rash   Family History  Problem Relation Age of Onset   Alcohol abuse Father    Depression Sister    Breast cancer Maternal Grandmother      Current Outpatient Medications (Cardiovascular):    lovastatin (MEVACOR) 20 MG tablet, Take 20 mg by mouth once a week.   Current Outpatient Medications (Respiratory):    albuterol (PROVENTIL HFA;VENTOLIN HFA) 108 (90 Base) MCG/ACT inhaler, Inhale into the lungs.   Fluticasone-Salmeterol (ADVAIR DISKUS) 250-50 MCG/DOSE AEPB, Inhale into the lungs.   levocetirizine (XYZAL) 5 MG tablet, Take 5 mg by mouth daily as needed.   Current Outpatient Medications (Analgesics):    acetaminophen (TYLENOL) 650 MG CR tablet, Take 650 mg by mouth every 8 (eight) hours as needed for pain.   meloxicam (MOBIC) 7.5 MG tablet, Take 7.5 mg by mouth as needed.   Current Outpatient Medications (Other):    busPIRone (BUSPAR) 15 MG tablet, Take 1 tablet (15 mg total) by mouth every morning.   calcium-vitamin D (OSCAL-500) 500-400 MG-UNIT tablet, Take by mouth.   DULoxetine (CYMBALTA) 20 MG capsule, TAKE 2 CAPSULES BY MOUTH  EVERY  MORNING   Omega-3 1000 MG CAPS, Take by mouth.   pregabalin (LYRICA) 75 MG capsule, Take 1 capsule (75 mg total) by mouth every evening.   traZODone (DESYREL) 50 MG tablet, Take 1/2-2 tabs po QHS prn insomnia   hydrOXYzine (ATARAX/VISTARIL) 10 MG tablet, Take 1 tablet (10 mg total) by mouth at bedtime as needed for up to 30 days.   Reviewed prior external information including notes and imaging from  primary care provider As well as notes that were available from care everywhere and other healthcare systems.  Past medical history, social, surgical and family history all reviewed in electronic medical record.  No pertanent information unless stated regarding to the chief complaint.   Review of Systems:  No headache,  visual changes, nausea, vomiting, diarrhea, constipation, dizziness, abdominal pain, skin rash, fevers, chills, night sweats, weight loss, swollen lymph nodes, body aches, joint swelling, chest pain, shortness of breath, mood changes. POSITIVE muscle aches  Objective  Blood pressure 124/76, pulse (!) 54, height 5\' 4"  (1.626 m), weight 146 lb (66.2 kg), SpO2 99%.   General: No apparent distress alert and oriented x3 mood and affect normal, dressed appropriately.  HEENT: Pupils equal, extraocular movements intact  Respiratory: Patient's speak in full sentences and does not appear short of breath  Cardiovascular: No lower extremity edema, non tender, no erythema  Patient does have some crepitus noted.  Patient does have instability with valgus and varus force. Patient's right knee does have tenderness to palpation.  Limited muscular skeletal ultrasound was performed and interpreted by Antoine Primas, M  Patient does have significant narrowing with nearly bone-on-bone osteoarthritic changes noted of the medial compartment as well as the synovitis and hypoechoic changes in the patellofemoral joint.  Degenerative changes of the meniscus noted. Impression: Severe arthritis with joint effusion and synovitis  After informed written and verbal consent, patient was seated on exam table. Left knee was prepped with alcohol swab and utilizing anterolateral approach, patient's left knee space was injected with 4:1  marcaine 0.5%: Kenalog 40mg /dL. Patient tolerated the procedure well without immediate complications.    Impression and Recommendations:     The above documentation has been reviewed and is accurate and complete Judi Saa, DO

## 2023-06-08 ENCOUNTER — Other Ambulatory Visit: Payer: Self-pay

## 2023-06-08 ENCOUNTER — Ambulatory Visit (INDEPENDENT_AMBULATORY_CARE_PROVIDER_SITE_OTHER): Payer: Medicare Other

## 2023-06-08 ENCOUNTER — Ambulatory Visit (INDEPENDENT_AMBULATORY_CARE_PROVIDER_SITE_OTHER): Payer: Medicare Other | Admitting: Family Medicine

## 2023-06-08 ENCOUNTER — Encounter: Payer: Self-pay | Admitting: Family Medicine

## 2023-06-08 VITALS — BP 124/76 | HR 54 | Ht 64.0 in | Wt 146.0 lb

## 2023-06-08 DIAGNOSIS — M25561 Pain in right knee: Secondary | ICD-10-CM | POA: Diagnosis not present

## 2023-06-08 DIAGNOSIS — M1712 Unilateral primary osteoarthritis, left knee: Secondary | ICD-10-CM | POA: Diagnosis not present

## 2023-06-08 DIAGNOSIS — M25562 Pain in left knee: Secondary | ICD-10-CM

## 2023-06-08 NOTE — Assessment & Plan Note (Signed)
Patient given injection and tolerated the procedure well, discussed icing regimen and home exercises, discussed which activities to do and which ones to avoid.  Patient is ambulatory and does have an abnormal thigh to calf ratio with varus deformity of the knee.  Instability noted.  OA stability brace would be helpful.  Discussed VMO strengthening exercises.  Follow-up again in 6 to 8 weeks

## 2023-06-08 NOTE — Patient Instructions (Addendum)
Xray today Vit D 2000IU daily Tart cherry extract 1200mg  at night Read about PRP  We will get gel approved  See me in 6 weeks

## 2023-06-21 ENCOUNTER — Ambulatory Visit (INDEPENDENT_AMBULATORY_CARE_PROVIDER_SITE_OTHER): Payer: Medicare Other | Admitting: Psychology

## 2023-06-21 DIAGNOSIS — F411 Generalized anxiety disorder: Secondary | ICD-10-CM

## 2023-06-21 DIAGNOSIS — F330 Major depressive disorder, recurrent, mild: Secondary | ICD-10-CM

## 2023-06-21 NOTE — Progress Notes (Unsigned)
                Kristin Sparks G Denario Bagot, LCSW 

## 2023-07-05 ENCOUNTER — Ambulatory Visit: Payer: Medicare Other | Admitting: Psychology

## 2023-07-19 ENCOUNTER — Ambulatory Visit: Payer: Medicare Other | Admitting: Psychology

## 2023-07-19 DIAGNOSIS — F411 Generalized anxiety disorder: Secondary | ICD-10-CM | POA: Diagnosis not present

## 2023-07-19 DIAGNOSIS — F330 Major depressive disorder, recurrent, mild: Secondary | ICD-10-CM

## 2023-07-19 NOTE — Progress Notes (Unsigned)
[name] [address] [place] [address] Phone: [phone] Subjective:   [name], am serving as a scribe for Dr. Antoine Primas.  I'm seeing this patient by the request  of:  Hande, Roderic Palau, MD  CC: Left knee pain  [id number]  06/08/2023 Patient given injection and tolerated the procedure well, discussed icing regimen and home exercises, discussed which activities to do and which ones to avoid.  Patient is ambulatory and does have an abnormal thigh to calf ratio with varus deformity of the knee.  Instability noted.  OA stability brace would be helpful.  Discussed VMO strengthening exercises.  Follow-up again in 6 to 8 weeks     Updated 07/20/2023 Kristin Sparks is a 67 y.o. female coming in with complaint of L knee pain, known arthritic changes.  Has had steroid injection previously.  Patient states that cortisone injection was helpful. Also using custom knee brace with running. Had less swelling after running.   Elbow pain has improved.    Past Medical History:  Diagnosis Date   Allergic rhinitis    Asthma    Elevated cholesterol    Neuropathy    Osteoarthritis    Past Surgical History:  Procedure Laterality Date   NASAL SINUS SURGERY     OVARIAN CYST REMOVAL     Social History   Socioeconomic History   Marital status: Widowed    Spouse name: Not on file   Number of children: Not on file   Years of education: Not on file   Highest education level: Not on file  Occupational History   Not on file  Tobacco Use   Smoking status: Never   Smokeless tobacco: Never  Substance and Sexual Activity   Alcohol use: Not on file   Drug use: Not on file   Sexual activity: Not on file  Other Topics Concern   Not on file  Social History Narrative   Not on file   Social Determinants of Health   Financial Resource Strain: Not on file  Food Insecurity: Not on file  Transportation Needs: Not on file  Physical Activity:  Not on file  Stress: Not on file  Social Connections: Not on file   Allergies  Allergen Reactions   Latex    Sulfa Antibiotics Rash   Family History  Problem Relation Age of Onset   Alcohol abuse Father    Depression Sister    Breast cancer Maternal Grandmother      Current Outpatient Medications (Cardiovascular):    lovastatin (MEVACOR) 20 MG tablet, Take 20 mg by mouth once a week.   Current Outpatient Medications (Respiratory):    albuterol (PROVENTIL HFA;VENTOLIN HFA) 108 (90 Base) MCG/ACT inhaler, Inhale into the lungs.   Fluticasone-Salmeterol (ADVAIR DISKUS) 250-50 MCG/DOSE AEPB, Inhale into the lungs.   levocetirizine (XYZAL) 5 MG tablet, Take 5 mg by mouth daily as needed.   Current Outpatient Medications (Analgesics):    acetaminophen (TYLENOL) 650 MG CR tablet, Take 650 mg by mouth every 8 (eight) hours as needed for pain.   meloxicam (MOBIC) 7.5 MG tablet, Take 7.5 mg by mouth as needed.   Current Outpatient Medications (Other):    busPIRone (BUSPAR) 15 MG tablet, Take 1 tablet (15 mg total) by mouth every morning.   calcium-vitamin D (OSCAL-500) 500-400 MG-UNIT tablet, Take by mouth.   DULoxetine (CYMBALTA) 20 MG capsule, TAKE 2 CAPSULES BY MOUTH EVERY  MORNING   hydrOXYzine (ATARAX/VISTARIL) 10 MG tablet, Take  1 tablet (10 mg total) by mouth at bedtime as needed for up to 30 days.   Omega-3 1000 MG CAPS, Take by mouth.   pregabalin (LYRICA) 75 MG capsule, Take 1 capsule (75 mg total) by mouth every evening.   traZODone (DESYREL) 50 MG tablet, Take 1/2-2 tabs po QHS prn insomnia   Objective  Blood pressure 122/82, pulse 65, height 5\' 4"  (1.626 m), weight 146 lb (66.2 kg), SpO2 98%.   General: No apparent distress alert and oriented x3 mood and affect normal, dressed appropriately.  HEENT: Pupils equal, extraocular movements intact  Respiratory: Patient's speak in full sentences and does not appear short of breath  Cardiovascular: No lower extremity edema,  non tender, no erythema  Left knee exam shows some arthritic changes noted.  Crepitus noted.  Trace effusion noted of the knee and only flexion to 90 degrees instability with valgus and varus force    Impression and Recommendations:    The above documentation has been reviewed and is accurate and complete Judi Saa, DO

## 2023-07-19 NOTE — Progress Notes (Unsigned)
                Diona Peregoy G Denario Bagot, LCSW 

## 2023-07-20 ENCOUNTER — Encounter: Payer: Self-pay | Admitting: Family Medicine

## 2023-07-20 ENCOUNTER — Other Ambulatory Visit: Payer: Self-pay

## 2023-07-20 ENCOUNTER — Ambulatory Visit: Payer: Medicare Other | Admitting: Family Medicine

## 2023-07-20 ENCOUNTER — Telehealth: Payer: Medicare Other | Admitting: Psychiatry

## 2023-07-20 VITALS — BP 122/82 | HR 65 | Ht 64.0 in | Wt 146.0 lb

## 2023-07-20 DIAGNOSIS — M25562 Pain in left knee: Secondary | ICD-10-CM

## 2023-07-20 DIAGNOSIS — M25522 Pain in left elbow: Secondary | ICD-10-CM | POA: Diagnosis not present

## 2023-07-20 NOTE — Assessment & Plan Note (Signed)
Left knee pain seems to be doing relatively well.  Does have the arthritic changes.  Doing much better with the OA stability brace significantly and is considering starting to run again.  We discussed the icing regimen if Kristin Sparks does decide to do this.  Could be a candidate for the viscosupplementation but will hold at this time.  Follow-up with me in 2 to 3 months

## 2023-07-20 NOTE — Patient Instructions (Signed)
Doing great Do 1 race See me in 2 months

## 2023-08-02 ENCOUNTER — Ambulatory Visit (INDEPENDENT_AMBULATORY_CARE_PROVIDER_SITE_OTHER): Payer: Medicare Other | Admitting: Psychology

## 2023-08-02 DIAGNOSIS — F330 Major depressive disorder, recurrent, mild: Secondary | ICD-10-CM

## 2023-08-02 DIAGNOSIS — F411 Generalized anxiety disorder: Secondary | ICD-10-CM | POA: Diagnosis not present

## 2023-08-02 NOTE — Progress Notes (Signed)
[facility] Counselor/Therapist Progress Note  Patient ID: Kristin Sparks, MRN: [id number],    Date: 08/02/2023  Time Spent: 53  minutes  Time in:  9:07 Time out: 10:00  Treatment Type: Individual Therapy  Reported Symptoms: anxiety and depression  Mental Status Exam: Appearance:  Casual     Behavior: Appropriate  Motor: Normal  Speech/Language:  Normal Rate  Affect: Blunt  Mood: pleasant  Thought process: normal  Thought content:   WNL  Sensory/Perceptual disturbances:   WNL  Orientation: oriented to person, place, time/date, and situation  Attention: Good  Concentration: Good  Memory: WNL  Fund of knowledge:  Good  Insight:   Good  Judgment:  Good  Impulse Control: Good   Risk Assessment: Danger to Self:  No Self-injurious Behavior: No Danger to Others: No Duty to Warn:no Physical Aggression / Violence:No  Access to Firearms a concern: No  Gang Involvement:No   Subjective: The patient attended a face-to-face individual therapy session via video visit.  The patient gave verbal consent to have the session on video on caregility and the patient is aware of the limitations of telehealth..  The patient was in her  home alone and the therapist was in the office.  The patient presents with a blunted affect and mood is pleasant.  The reports that she was talking to a travel agent today at 55.  She states that she is talking to her about having a trip to [place] for she and her sister.  She also reports that she wants to talk to her about solo trips.  I gave her lots of positive feedback for reaching out and taking some initiative to make things happen for herself.  We also talked about the possibility that she may be considering moving to [facility] to be near her son in the near future.  She is making some progress on the goals that we have set up for years.  About her continuing to find ways to get out and be more social.  Interventions: Cognitive Behavioral  Therapy and Assertiveness/Communication, problem solving  Diagnosis:Mild episode of recurrent major depressive disorder (HCC)  Generalized anxiety disorder  Plan:  Client Abilities/Strengths  Intelligent, caring, motivated  Client Treatment Preferences  Outpatient individual therapy  Client Statement of Needs  " I need some help to deal with my situation", "I 'm not sure if I'm depressed or what?"  Treatment Level  Outpatient Individual therapy  Symptoms  Frustration and anxiety related to providing oversight and caretaking to an aging, ailing, and dependent  parent.: No Description Entered (Status: improved). Lack of energy.: No Description Entered (Status:  improved). Motor tension (e.g., restlessness, tiredness, shakiness, muscle tension).: No Description  Entered (Status: improved). Poor concentration and indecisiveness.: No Description Entered (Status:  improved). Social withdrawal.: No Description Entered (Status: improved).  Problems Addressed  Unipolar Depression, Phase Of Life Problems, Unipolar Depression, Anxiety, Unipolar Depression,  Phase Of Life Problems, Anxiety  Goals 1. Appropriately grieve the loss in order to normalize mood and to return  to previously adaptive level of functioning. 2. Balance life activities between consideration of others and development of own interests. Objective Apply problem-solving skills to current circumstances Target Date: 2023-10-16 Frequency: Biweekly Progress: 90 Modality: individual Related Interventions 1. Use modeling and role-playing with the client to apply the problem-solving approach to his/her  current circumstances (or assign "Applying Problem-Solving to Interpersonal Conflict" from  the Adult Psychotherapy Homework Planner by Stephannie Li); encourage implementation of action  plan, reinforcing  success and redirecting for failure. Objective Implement new activities that increase a sense of satisfaction. Target Date:  2023-10-16 Frequency: Biweekly Progress: 90 Modality: individual Related Interventions 1. Develop a plan with the client to include activities that will increase his/her satisfaction, fulfill  his/her values, and improve the quality of his/her life. 3. Develop healthy interpersonal relationships that lead to the alleviation  and help prevent the relapse of depression. Objective Learn and implement problem-solving and decision-making skills. Target Date: 2023-10-16 Frequency: Biweekly Progress: 90 Modality: individual Related Interventions 1. Encourage in the client the development of a positive problem orientation in which problems  and solving them are viewed as a natural part of life and not something to be feared, despaired,  or avoided. 2. Conduct Problem-Solving Therapy  Implement new activities that increase a sense of satisfaction. Target Date: 2023-10-16 Frequency: Biweekly Progress: 90 Modality: individual Related Interventions 1. Develop a plan with the client to include activities that will increase his/her satisfaction, fulfill  his/her values, and improve the quality of his/her life. 3. Develop healthy interpersonal relationships that lead to the alleviation  and help prevent the relapse of depression. Objective Learn and implement problem-solving and decision-making skills. Target Date: 2023-10-16 Frequency: Biweekly Progress: 90 Modality: individual Related Interventions 1. Encourage in the client the development of a positive problem orientation in which problems  and solving them are viewed as a natural part of life and not something to be feared, despaired,  or avoided.  Objective Identify and replace thoughts and beliefs that support depression. Target Date: 2023-10-16 Frequency: Biweekly Progress: 90 Modality: individual Related Interventions 1. Explore and restructure underlying assumptions and beliefs reflected in biased self-talk that  may put the client  at risk for relapse or recurrence. 2. Facilitate and reinforce the client's shift from biased depressive self-talk and beliefs to realitybased cognitive messages that enhance self-confidence and increase adaptive actions (see  "Positive Self-Talk" in the Adult Psychotherapy Homework Planner by Stephannie Li). 4. Enhance ability to effectively cope with the full variety of life's worries  and anxieties. Objective Identify, challenge, and replace biased, fearful self-talk with positive, realistic, and empowering selftalk. Target Date: 2023-10-16 Frequency: Biweekly Progress: 80 Modality: individual Related Interventions 1. Explore the client's schema and self-talk that mediate his/her fear response; assist him/her in  challenging the biases; replace the distorted messages with reality-based alternatives and  positive, realistic self-talk that will increase his/her self-confidence in coping with irrational  fears (see Cognitive Therapy of Anxiety Disorders by Laurence Slate). Objective Learn and implement problem-solving strategies for realistically addressing worries. Target Date: 2023-10-16 Frequency: Biweekly Progress: 60 Modality: individual Therapy 5. Recognize, accept, and cope with feelings of depression. 6. Resolve conflicted feelings and adapt to the new life circumstances. 7. Stabilize anxiety level while increasing ability to function on a daily  The patient is making progress and has approved this plan of treatment.  Tramell Piechota G Charron Coultas, LCSW

## 2023-08-10 ENCOUNTER — Encounter: Payer: Self-pay | Admitting: Psychiatry

## 2023-08-16 ENCOUNTER — Ambulatory Visit (INDEPENDENT_AMBULATORY_CARE_PROVIDER_SITE_OTHER): Payer: Medicare Other | Admitting: Psychology

## 2023-08-16 DIAGNOSIS — F330 Major depressive disorder, recurrent, mild: Secondary | ICD-10-CM

## 2023-08-16 DIAGNOSIS — F411 Generalized anxiety disorder: Secondary | ICD-10-CM | POA: Diagnosis not present

## 2023-08-16 NOTE — Progress Notes (Unsigned)
                Kristin Sparks G Dearius Hoffmann, LCSW

## 2023-08-23 ENCOUNTER — Encounter: Payer: Self-pay | Admitting: Psychiatry

## 2023-08-23 ENCOUNTER — Telehealth (INDEPENDENT_AMBULATORY_CARE_PROVIDER_SITE_OTHER): Payer: Medicare Other | Admitting: Psychiatry

## 2023-08-23 DIAGNOSIS — F330 Major depressive disorder, recurrent, mild: Secondary | ICD-10-CM | POA: Diagnosis not present

## 2023-08-23 DIAGNOSIS — F411 Generalized anxiety disorder: Secondary | ICD-10-CM | POA: Diagnosis not present

## 2023-08-23 MED ORDER — BUPROPION HCL ER (SR) 100 MG PO TB12: 100.0000 mg | ORAL_TABLET | Freq: Every morning | ORAL | 1 refills | Status: AC

## 2023-08-23 MED ORDER — BUSPIRONE HCL 15 MG PO TABS
15.0000 mg | ORAL_TABLET | Freq: Every morning | ORAL | 1 refills | Status: AC
Start: 2023-08-23 — End: ?

## 2023-08-23 NOTE — Progress Notes (Signed)
Kristin Sparks [id number] Jun 06, 1956 67 y.o.  Virtual Visit via Video Note  I connected with pt @ on 08/23/23 at 11:00 AM EST by a video enabled telemedicine application and verified that I am speaking with the correct person using two identifiers.   I discussed the limitations of evaluation and management by telemedicine and the availability of in person appointments. The patient expressed understanding and agreed to proceed.  I discussed the assessment and treatment plan with the patient. The patient was provided an opportunity to ask questions and all were answered. The patient agreed with the plan and demonstrated an understanding of the instructions.   The patient was advised to call back or seek an in-person evaluation if the symptoms worsen or if the condition fails to improve as anticipated.  I provided 38 minutes of non-face-to-face time during this encounter.  The patient was located at home.  The provider was located at [facility].   Corie Chiquito, PMHNP   Subjective:   Patient ID:  Kristin Sparks is a 67 y.o. (DOB January 04, 1956) female.  Chief Complaint:  Chief Complaint  Patient presents with   Depression   Follow-up    Anxiety    HPI LACY HULSEBUS presents for follow-up of depression and anxiety. She reports, "I'm about the same." Cymbalta was increased to 40 mg daily at last visit. She noticed that she felt "just a little bit better." She denies sleep disturbance or feeling groggy in the morning like she had with Cymbalta 60 mg daily in the past. She reports that her energy has been "a little bit better on the 40" mg of Cymbalta. She reports that she has some periods of low mood- "lonely... loss of direction and purpose." Motivation varies in response to knee pain. Sleep has been "ok" and has been in a routine with dimming lights before bedtime and reading. She reports that her concentration is "not the best." She is trying to read more (about 30-45 minutes  daily). She manages her own finances. Denies memory difficulties. Some diminished interest in some activities. She prefers to do activities with someone instead of going alone. Denies SI.   She has not been able to run as much as in the past. She was running 5k's in the past.   She and her sister are about to book a trip to [place]. She has some trips planned. Going to the beach next week.   She is considering moving closer to [facility] to be closer to son, daughter-in-law, and step-daughter.   Past medication trials: Sertraline-diarrhea.  Felt calmer. Lexapro-prescribed for hot flashes.  Had increased anxiety while taking. Effexor XR-some benefit and then was less effective.  Signs and symptoms inadequately controlled at 75 mg and unable to tolerate higher doses. Cymbalta Buspar Propanolol Ativan Hydroxyzine Lyrica Gabapentin-did not help with neuropathy  Review of Systems:  Review of Systems  Gastrointestinal: Negative.   Musculoskeletal:  Positive for arthralgias. Negative for gait problem.       Knee pain  Neurological:  Negative for dizziness and tremors.  Psychiatric/Behavioral:         Please refer to HPI    Medications: I have reviewed the patient's current medications.  Current Outpatient Medications  Medication Sig Dispense Refill   albuterol (PROVENTIL HFA;VENTOLIN HFA) 108 (90 Base) MCG/ACT inhaler Inhale into the lungs.     calcium-vitamin D (OSCAL-500) 500-400 MG-UNIT tablet Take by mouth.     Cholecalciferol (VITAMIN D3) 50 MCG (2000 UT) CAPS Take by  mouth.     DULoxetine (CYMBALTA) 20 MG capsule TAKE 2 CAPSULES BY MOUTH EVERY  MORNING 180 capsule 3   levocetirizine (XYZAL) 5 MG tablet Take 5 mg by mouth daily as needed.      lovastatin (MEVACOR) 20 MG tablet Take 20 mg by mouth once a week.      meloxicam (MOBIC) 7.5 MG tablet Take 7.5 mg by mouth as needed.     minoxidil (LONITEN) 2.5 MG tablet Take 1.25 mg by mouth daily.     Omega-3 1000 MG CAPS Take by  mouth.     TART CHERRY PO Take by mouth.     acetaminophen (TYLENOL) 650 MG CR tablet Take 650 mg by mouth every 8 (eight) hours as needed for pain.     buPROPion ER (WELLBUTRIN SR) 100 MG 12 hr tablet Take 1 tablet (100 mg total) by mouth every morning. 90 tablet 1   busPIRone (BUSPAR) 15 MG tablet Take 1 tablet (15 mg total) by mouth every morning. 90 tablet 1   Fluticasone-Salmeterol (ADVAIR DISKUS) 250-50 MCG/DOSE AEPB Inhale into the lungs. (Patient not taking: Reported on 08/23/2023)     hydrOXYzine (ATARAX/VISTARIL) 10 MG tablet Take 1 tablet (10 mg total) by mouth at bedtime as needed for up to 30 days. 30 tablet 2   pregabalin (LYRICA) 75 MG capsule Take 1 capsule (75 mg total) by mouth every evening. (Patient not taking: Reported on 08/23/2023) 90 capsule 1   traZODone (DESYREL) 50 MG tablet Take 1/2-2 tabs po QHS prn insomnia (Patient not taking: Reported on 08/23/2023) 60 tablet 2   No current facility-administered medications for this visit.    Medication Side Effects: None  Allergies:  Allergies  Allergen Reactions   Latex    Sulfa Antibiotics Rash    Past Medical History:  Diagnosis Date   Allergic rhinitis    Asthma    Elevated cholesterol    Neuropathy    Osteoarthritis     Family History  Problem Relation Age of Onset   Alcohol abuse Father    Depression Sister    Breast cancer Maternal Grandmother     Social History   Socioeconomic History   Marital status: Widowed    Spouse name: Not on file   Number of children: Not on file   Years of education: Not on file   Highest education level: Not on file  Occupational History   Not on file  Tobacco Use   Smoking status: Never   Smokeless tobacco: Never  Substance and Sexual Activity   Alcohol use: Not on file   Drug use: Not on file   Sexual activity: Not on file  Other Topics Concern   Not on file  Social History Narrative   Not on file   Social Determinants of Health   Financial Resource  Strain: Not on file  Food Insecurity: Not on file  Transportation Needs: Not on file  Physical Activity: Not on file  Stress: Not on file  Social Connections: Not on file  Intimate Partner Violence: Not on file    Past Medical History, Surgical history, Social history, and Family history were reviewed and updated as appropriate.   Please see review of systems for further details on the patient's review from today.   Objective:   Physical Exam:  There were no vitals taken for this visit.  Physical Exam Neurological:     Mental Status: She is alert and oriented to person, place, and time.  Cranial Nerves: No dysarthria.  Psychiatric:        Attention and Perception: Attention and perception normal.        Mood and Affect: Mood is not anxious.        Speech: Speech normal.        Behavior: Behavior is cooperative.        Thought Content: Thought content normal. Thought content is not paranoid or delusional. Thought content does not include homicidal or suicidal ideation. Thought content does not include homicidal or suicidal plan.        Cognition and Memory: Cognition and memory normal.        Judgment: Judgment normal.     Comments: Insight intact Mood is mildly depressed     Lab Review:  No results found for: "NA", "K", "CL", "CO2", "GLUCOSE", "BUN", "CREATININE", "CALCIUM", "PROT", "ALBUMIN", "AST", "ALT", "ALKPHOS", "BILITOT", "GFRNONAA", "GFRAA"  No results found for: "WBC", "RBC", "HGB", "HCT", "PLT", "MCV", "MCH", "MCHC", "RDW", "LYMPHSABS", "MONOABS", "EOSABS", "BASOSABS"  No results found for: "POCLITH", "LITHIUM"   No results found for: "PHENYTOIN", "PHENOBARB", "VALPROATE", "CBMZ"   .res Assessment: Plan:    39 minutes spent dedicated to the care of this patient on the date of this encounter to include pre-visit review of records, ordering of medication, post visit documentation, and face-to-face time with the patient discussing mild depressive symptoms and  treatment options. Reviewed medical record and discussed response to Wellbutrin SR and that it had seemed to help with low mood, decreased energy, and low motivation in the past without known side effects. Pt agrees to re-starting Wellbutrin SR for depression.  Will re-start Wellbutrin SR 100 mg daily for depression.  Continue Buspar 15 mg daily for anxiety.  Continue Duloxetine 40 mg daily for depression and anxiety.  Recommend continuing therapy with Bambi Cottle, LCSW.  Patient advised to contact office with any questions, adverse effects, or acute worsening in signs and symptoms.    Kristin Sparks was seen today for depression and follow-up.  Diagnoses and all orders for this visit:  Mild episode of recurrent major depressive disorder (HCC) -     buPROPion ER (WELLBUTRIN SR) 100 MG 12 hr tablet; Take 1 tablet (100 mg total) by mouth every morning.  Generalized anxiety disorder -     busPIRone (BUSPAR) 15 MG tablet; Take 1 tablet (15 mg total) by mouth every morning.     Please see After Visit Summary for patient specific instructions.  Future Appointments  Date Time Provider Department Center  09/13/2023  9:00 AM Cottle, Lynnell Dike, LCSW [facility] None  09/14/2023  1:30 PM Judi Saa, DO [facility] None  09/27/2023  9:00 AM Cottle, Bambi G, LCSW [facility] None    No orders of the defined types were placed in this encounter.     -------------------------------

## 2023-08-30 ENCOUNTER — Ambulatory Visit: Payer: Medicare Other | Admitting: Psychology

## 2023-09-13 ENCOUNTER — Ambulatory Visit: Payer: Medicare Other | Admitting: Psychology

## 2023-09-13 DIAGNOSIS — F411 Generalized anxiety disorder: Secondary | ICD-10-CM

## 2023-09-13 DIAGNOSIS — F330 Major depressive disorder, recurrent, mild: Secondary | ICD-10-CM | POA: Diagnosis not present

## 2023-09-13 NOTE — Progress Notes (Unsigned)
[name] [address] [place] [address] Phone: [phone] Subjective:   [name], am serving as a scribe for Dr. Antoine Primas.  I'm seeing this patient by the request  of:  Hande, Roderic Palau, MD  CC: Left knee pain  ONG:[id number]  07/20/2023 Left knee pain seems to be doing relatively well.  Does have the arthritic changes.  Doing much better with the OA stability brace significantly and is considering starting to run again.  We discussed the icing regimen if she does decide to do this.  Could be a candidate for the viscosupplementation but will hold at this time.  Follow-up with me in 2 to 3 months     Updated 09/14/2023 Kristin Sparks is a 67 y.o. female coming in with complaint of L knee pain. Patient states that she ran a 5k on Saturday and knee swelled up after that run. Doing HEP and feels a bit stronger. Wore brace for her run. Notices that shoe is tighter today on L foot. Has not had to take meloxicam.       Past Medical History:  Diagnosis Date   Allergic rhinitis    Asthma    Elevated cholesterol    Neuropathy    Osteoarthritis    Past Surgical History:  Procedure Laterality Date   NASAL SINUS SURGERY     OVARIAN CYST REMOVAL     Social History   Socioeconomic History   Marital status: Widowed    Spouse name: Not on file   Number of children: Not on file   Years of education: Not on file   Highest education level: Not on file  Occupational History   Not on file  Tobacco Use   Smoking status: Never   Smokeless tobacco: Never  Substance and Sexual Activity   Alcohol use: Not on file   Drug use: Not on file   Sexual activity: Not on file  Other Topics Concern   Not on file  Social History Narrative   Not on file   Social Drivers of Health   Financial Resource Strain: Not on file  Food Insecurity: Not on file  Transportation Needs: Not on file  Physical Activity: Not on file  Stress: Not on file   Social Connections: Not on file   Allergies  Allergen Reactions   Latex    Sulfa Antibiotics Rash   Family History  Problem Relation Age of Onset   Alcohol abuse Father    Depression Sister    Breast cancer Maternal Grandmother      Current Outpatient Medications (Cardiovascular):    lovastatin (MEVACOR) 20 MG tablet, Take 20 mg by mouth once a week.    minoxidil (LONITEN) 2.5 MG tablet, Take 1.25 mg by mouth daily.  Current Outpatient Medications (Respiratory):    albuterol (PROVENTIL HFA;VENTOLIN HFA) 108 (90 Base) MCG/ACT inhaler, Inhale into the lungs.   Fluticasone-Salmeterol (ADVAIR DISKUS) 250-50 MCG/DOSE AEPB, Inhale into the lungs. (Patient not taking: Reported on 08/23/2023)   levocetirizine (XYZAL) 5 MG tablet, Take 5 mg by mouth daily as needed.   Current Outpatient Medications (Analgesics):    acetaminophen (TYLENOL) 650 MG CR tablet, Take 650 mg by mouth every 8 (eight) hours as needed for pain.   meloxicam (MOBIC) 7.5 MG tablet, Take 7.5 mg by mouth as needed.   Current Outpatient Medications (Other):    buPROPion ER (WELLBUTRIN SR) 100 MG 12 hr tablet, Take 1 tablet (100 mg total) by mouth  every morning.   busPIRone (BUSPAR) 15 MG tablet, Take 1 tablet (15 mg total) by mouth every morning.   calcium-vitamin D (OSCAL-500) 500-400 MG-UNIT tablet, Take by mouth.   Cholecalciferol (VITAMIN D3) 50 MCG (2000 UT) CAPS, Take by mouth.   DULoxetine (CYMBALTA) 20 MG capsule, TAKE 2 CAPSULES BY MOUTH EVERY  MORNING   hydrOXYzine (ATARAX/VISTARIL) 10 MG tablet, Take 1 tablet (10 mg total) by mouth at bedtime as needed for up to 30 days.   Omega-3 1000 MG CAPS, Take by mouth.   pregabalin (LYRICA) 75 MG capsule, Take 1 capsule (75 mg total) by mouth every evening. (Patient not taking: Reported on 08/23/2023)   TART CHERRY PO, Take by mouth.   traZODone (DESYREL) 50 MG tablet, Take 1/2-2 tabs po QHS prn insomnia (Patient not taking: Reported on 08/23/2023)   Reviewed  prior external information including notes and imaging from  primary care provider As well as notes that were available from care everywhere and other healthcare systems.  Past medical history, social, surgical and family history all reviewed in electronic medical record.  No pertanent information unless stated regarding to the chief complaint.   Review of Systems:  No headache, visual changes, nausea, vomiting, diarrhea, constipation, dizziness, abdominal pain, skin rash, fevers, chills, night sweats, weight loss, swollen lymph nodes, body aches, joint swelling, chest pain, shortness of breath, mood changes. POSITIVE muscle aches  Objective  Blood pressure 116/78, pulse (!) 53, height 5\' 4"  (1.626 m), weight 145 lb (65.8 kg), SpO2 99%.   General: No apparent distress alert and oriented x3 mood and affect normal, dressed appropriately.  HEENT: Pupils equal, extraocular movements intact  Respiratory: Patient's speak in full sentences and does not appear short of breath  Cardiovascular: No lower extremity edema, non tender, no erythema  Left knee does have severe arthritic changes noted.  Some instability with valgus and varus force.  Trace effusion noted of the patellofemoral joint.  After informed written and verbal consent, patient was seated on exam table. Left knee was prepped with alcohol swab and utilizing anterolateral approach, patient's left knee space was injected with 60 mg per 3 mL of Durolane (sodium hyaluronate) in a prefilled syringe was injected easily into the knee through a 22-gauge needle..Patient tolerated the procedure well without immediate complications.    Impression and Recommendations:     The above documentation has been reviewed and is accurate and complete Judi Saa, DO

## 2023-09-13 NOTE — Progress Notes (Unsigned)
                Diona Peregoy G Denario Bagot, LCSW 

## 2023-09-14 ENCOUNTER — Encounter: Payer: Self-pay | Admitting: Family Medicine

## 2023-09-14 ENCOUNTER — Ambulatory Visit: Payer: Medicare Other | Admitting: Family Medicine

## 2023-09-14 ENCOUNTER — Other Ambulatory Visit: Payer: Self-pay

## 2023-09-14 VITALS — BP 116/78 | HR 53 | Ht 64.0 in | Wt 145.0 lb

## 2023-09-14 DIAGNOSIS — G8929 Other chronic pain: Secondary | ICD-10-CM | POA: Diagnosis not present

## 2023-09-14 DIAGNOSIS — M25562 Pain in left knee: Secondary | ICD-10-CM | POA: Diagnosis not present

## 2023-09-14 DIAGNOSIS — M1712 Unilateral primary osteoarthritis, left knee: Secondary | ICD-10-CM

## 2023-09-14 DIAGNOSIS — M25522 Pain in left elbow: Secondary | ICD-10-CM

## 2023-09-14 MED ORDER — SODIUM HYALURONATE 60 MG/3ML IX PRSY
60.0000 mg | PREFILLED_SYRINGE | Freq: Once | INTRA_ARTICULAR | Status: AC
  Administered 2023-09-14: 60 mg via INTRA_ARTICULAR

## 2023-09-14 NOTE — Assessment & Plan Note (Signed)
Patient does have a lot of instability with this.  Is trying to still run on a relatively regular basis.  Will start to adapt and do more low impact exercises.  Does have the OA stability brace that I think will be helpful.  Viscosupplementation given today.  If continuing to have pain could be a candidate for possible PRP as well.  Follow-up with me again in 2 to 3 months.

## 2023-09-14 NOTE — Patient Instructions (Signed)
[place] today Arnica lotion See me in 2-3 months

## 2023-09-27 ENCOUNTER — Ambulatory Visit: Payer: Medicare Other | Admitting: Psychology

## 2023-10-11 ENCOUNTER — Ambulatory Visit: Payer: Medicare Other | Admitting: Psychology

## 2023-10-11 DIAGNOSIS — F411 Generalized anxiety disorder: Secondary | ICD-10-CM | POA: Diagnosis not present

## 2023-10-11 DIAGNOSIS — F330 Major depressive disorder, recurrent, mild: Secondary | ICD-10-CM

## 2023-10-11 NOTE — Progress Notes (Signed)
 [facility] Counselor/Therapist Progress Note  Patient ID: Kristin Sparks, MRN: [id number],    Date: 10/11/2023  Time Spent: 61 minutes  Time in:  9:04 Time out: 10:05  Treatment Type: Individual Therapy  Reported Symptoms: anxiety and depression  Mental Status Exam: Appearance:  Casual     Behavior: Appropriate  Motor: Normal  Speech/Language:  Normal Rate  Affect: Blunt  Mood: pleasant  Thought process: normal  Thought content:   WNL  Sensory/Perceptual disturbances:   WNL  Orientation: oriented to person, place, time/date, and situation  Attention: Good  Concentration: Good  Memory: WNL  Fund of knowledge:  Good  Insight:   Good  Judgment:  Good  Impulse Control: Good   Risk Assessment: Danger to Self:  No Self-injurious Behavior: No Danger to Others: No Duty to Warn:no Physical Aggression / Violence:No  Access to Firearms a concern: No  Gang Involvement:No   Subjective: The patient attended a face-to-face individual therapy session via video visit.  The patient gave verbal consent to have the session on video on caregility and the patient is aware of the limitations of telehealth. The patient was in her  home alone and the therapist was in the office.  The patient presents with a blunted affect and mood is pleasant.  The patient reports that she has been looking some on line to find houses and the [place] area and also in the [place] area.  We talked about her going to look at some places to see if she could get a feel for where she actually wanted to be.  We talked about the step-by-step process of trying to figure out where it is that she wants to live and looking at her options in those places.  The benefit to living in [place] is that her son and daughter-in-law are there and also her step daughter and son-in-law are there.  Her sister lives in [place] but she is not sure that she wants to end up in that area as she does not feel as strong pull  to go back to where she came from.  She has some trips planned moving forward and that sounds good.  She seems to be making some good movement.   Interventions: Cognitive Behavioral Therapy and Assertiveness/Communication, problem solving  Diagnosis:Mild episode of recurrent major depressive disorder (HCC)  Generalized anxiety disorder  Plan:  Client Abilities/Strengths  Intelligent, caring, motivated  Client Treatment Preferences  Outpatient individual therapy  Client Statement of Needs   I need some help to deal with my situation, I 'm not sure if I'm depressed or what?  Treatment Level  Outpatient Individual therapy  Symptoms  Frustration and anxiety related to providing oversight and caretaking to an aging, ailing, and dependent  parent.: No Description Entered (Status: improved). Lack of energy.: No Description Entered (Status:  improved). Motor tension (e.g., restlessness, tiredness, shakiness, muscle tension).: No Description  Entered (Status: improved). Poor concentration and indecisiveness.: No Description Entered (Status:  improved). Social withdrawal.: No Description Entered (Status: improved).  Problems Addressed  Unipolar Depression, Phase Of Life Problems, Unipolar Depression, Anxiety, Unipolar Depression,  Phase Of Life Problems, Anxiety  Goals 1. Appropriately grieve the loss in order to normalize mood and to return  to previously adaptive level of functioning. 2. Balance life activities between consideration of others and development of own interests. Objective Apply problem-solving skills to current circumstances Target Date: 2023-10-16 Frequency: Biweekly Progress: 90 Modality: individual Related Interventions 1. Use modeling and role-playing with the  client to apply the problem-solving approach to his/her  current circumstances (or assign Applying Problem-Solving to Interpersonal Conflict from  the Adult Psychotherapy Homework Planner by Jenniffer);  encourage implementation of action  plan, reinforcing success and redirecting for failure. Objective Implement new activities that increase a sense of satisfaction. Target Date: 2024-10-15 Frequency: Biweekly Progress: 90 Modality: individual Related Interventions 1. Develop a plan with the client to include activities that will increase his/her satisfaction, fulfill  his/her values, and improve the quality of his/her life. 3. Develop healthy interpersonal relationships that lead to the alleviation  and help prevent the relapse of depression. Objective Learn and implement problem-solving and decision-making skills. Target Date: 2024-10-15 Frequency: Biweekly Progress: 90 Modality: individual Related Interventions 1. Encourage in the client the development of a positive problem orientation in which problems  and solving them are viewed as a natural part of life and not something to be feared, despaired,  or avoided. 2. Conduct Problem-Solving Therapy  Implement new activities that increase a sense of satisfaction. Target Date: 2024-10-15 Frequency: Biweekly Progress: 90 Modality: individual Related Interventions 1. Develop a plan with the client to include activities that will increase his/her satisfaction, fulfill  his/her values, and improve the quality of his/her life. 3. Develop healthy interpersonal relationships that lead to the alleviation  and help prevent the relapse of depression. Objective Learn and implement problem-solving and decision-making skills. Target Date: 2024-10-15 Frequency: Biweekly Progress: 90 Modality: individual Related Interventions 1. Encourage in the client the development of a positive problem orientation in which problems  and solving them are viewed as a natural part of life and not something to be feared, despaired,  or avoided.  Objective Identify and replace thoughts and beliefs that support depression. Target Date: 2024-10-15 Frequency:  Biweekly Progress: 90 Modality: individual Related Interventions 1. Explore and restructure underlying assumptions and beliefs reflected in biased self-talk that  may put the client at risk for relapse or recurrence. 2. Facilitate and reinforce the client's shift from biased depressive self-talk and beliefs to realitybased cognitive messages that enhance self-confidence and increase adaptive actions (see  Positive Self-Talk in the Adult Psychotherapy Homework Planner by Jenniffer). 4. Enhance ability to effectively cope with the full variety of life's worries  and anxieties. Objective Identify, challenge, and replace biased, fearful self-talk with positive, realistic, and empowering selftalk. Target Date: 2024-10-15 Frequency: Biweekly Progress: 80 Modality: individual Related Interventions 1. Explore the client's schema and self-talk that mediate his/her fear response; assist him/her in  challenging the biases; replace the distorted messages with reality-based alternatives and  positive, realistic self-talk that will increase his/her self-confidence in coping with irrational  fears (see Cognitive Therapy of Anxiety Disorders by Gretta armin Mon). Objective Learn and implement problem-solving strategies for realistically addressing worries. Target Date: 2024-10-15 Frequency: Biweekly Progress: 60 Modality: individual Therapy 5. Recognize, accept, and cope with feelings of depression. 6. Resolve conflicted feelings and adapt to the new life circumstances. 7. Stabilize anxiety level while increasing ability to function on a daily  The patient is making progress and has approved this plan of treatment.  Blanca Carreon G Trooper Olander, LCSW

## 2023-10-25 ENCOUNTER — Ambulatory Visit: Payer: Medicare Other | Admitting: Psychology

## 2023-10-25 DIAGNOSIS — F411 Generalized anxiety disorder: Secondary | ICD-10-CM

## 2023-10-25 DIAGNOSIS — F330 Major depressive disorder, recurrent, mild: Secondary | ICD-10-CM

## 2023-10-25 NOTE — Progress Notes (Signed)
[facility] Counselor/Therapist Progress Note  Patient ID: Kristin Sparks, MRN: [id number],    Date: 10/25/2023  Time Spent:56 minutes  Time in:  9:05 Time out: 10:01  Treatment Type: Individual Therapy  Reported Symptoms: anxiety and depression  Mental Status Exam: Appearance:  Casual     Behavior: Appropriate  Motor: Normal  Speech/Language:  Normal Rate  Affect: Blunt  Mood: pleasant  Thought process: normal  Thought content:   WNL  Sensory/Perceptual disturbances:   WNL  Orientation: oriented to person, place, time/date, and situation  Attention: Good  Concentration: Good  Memory: WNL  Fund of knowledge:  Good  Insight:   Good  Judgment:  Good  Impulse Control: Good   Risk Assessment: Danger to Self:  No Self-injurious Behavior: No Danger to Others: No Duty to Warn:no Physical Aggression / Violence:No  Access to Firearms a concern: No  Gang Involvement:No   Subjective: The patient attended a face-to-face individual therapy session via video visit.  The patient gave verbal consent to have the session on video on caregility and the patient is aware of the limitations of telehealth. The patient was in her  home alone and the therapist was in the office.  The patient presents with a blunted affect and mood is pleasant.  The patient reports that she is doing more looking in [place] now for a place to live.  I think this is progress for her because she has been stagnant for so long.  She reports that she did go with her son and she is going to go with her step daughter's husband.  The patient talked about wanting to do something else and talked about not knowing why she cannot go and do something by herself.  We talked about her having planned trips if she goes somewhere big or that she may want to consider going to someplace that she is familiar with and feels more comfortable.  The patient continues to worry and fret over things but she seems to be making  more progress than she has in a long time.  We will continue to talk about how to get her moving more in the direction of making some decisions about her living situation.  Interventions: Cognitive Behavioral Therapy and Assertiveness/Communication, problem solving  Diagnosis:Mild episode of recurrent major depressive disorder (HCC)  Generalized anxiety disorder  Plan:  Client Abilities/Strengths  Intelligent, caring, motivated  Client Treatment Preferences  Outpatient individual therapy  Client Statement of Needs  " I need some help to deal with my situation", "I 'm not sure if I'm depressed or what?"  Treatment Level  Outpatient Individual therapy  Symptoms  Frustration and anxiety related to providing oversight and caretaking to an aging, ailing, and dependent  parent.: No Description Entered (Status: improved). Lack of energy.: No Description Entered (Status:  improved). Motor tension (e.g., restlessness, tiredness, shakiness, muscle tension).: No Description  Entered (Status: improved). Poor concentration and indecisiveness.: No Description Entered (Status:  improved). Social withdrawal.: No Description Entered (Status: improved).  Problems Addressed  Unipolar Depression, Phase Of Life Problems, Unipolar Depression, Anxiety, Unipolar Depression,  Phase Of Life Problems, Anxiety  Goals 1. Appropriately grieve the loss in order to normalize mood and to return  to previously adaptive level of functioning. 2. Balance life activities between consideration of others and development of own interests. Objective Apply problem-solving skills to current circumstances Target Date: 2024-10-15 Frequency: Biweekly Progress: 90 Modality: individual Related Interventions 1. Use modeling and role-playing with the client to  apply the problem-solving approach to his/her  current circumstances (or assign "Applying Problem-Solving to Interpersonal Conflict" from  the Adult Psychotherapy  Homework Planner by [facility]); encourage implementation of action  plan, reinforcing success and redirecting for failure. Objective Implement new activities that increase a sense of satisfaction. Target Date: 2024-10-15 Frequency: Biweekly Progress: 90 Modality: individual Related Interventions 1. Develop a plan with the client to include activities that will increase his/her satisfaction, fulfill  his/her values, and improve the quality of his/her life. 3. Develop healthy interpersonal relationships that lead to the alleviation  and help prevent the relapse of depression. Objective Learn and implement problem-solving and decision-making skills. Target Date: 2024-10-15 Frequency: Biweekly Progress: 90 Modality: individual Related Interventions 1. Encourage in the client the development of a positive problem orientation in which problems  and solving them are viewed as a natural part of life and not something to be feared, despaired,  or avoided. 2. Conduct Problem-Solving Therapy  Implement new activities that increase a sense of satisfaction. Target Date: 2024-10-15 Frequency: Biweekly Progress: 90 Modality: individual Related Interventions 1. Develop a plan with the client to include activities that will increase his/her satisfaction, fulfill  his/her values, and improve the quality of his/her life. 3. Develop healthy interpersonal relationships that lead to the alleviation  and help prevent the relapse of depression. Objective Learn and implement problem-solving and decision-making skills. Target Date: 2024-10-15 Frequency: Biweekly Progress: 90 Modality: individual Related Interventions 1. Encourage in the client the development of a positive problem orientation in which problems  and solving them are viewed as a natural part of life and not something to be feared, despaired,  or avoided.  Objective Identify and replace thoughts and beliefs that support depression. Target  Date: 2024-10-15 Frequency: Biweekly Progress: 90 Modality: individual Related Interventions 1. Explore and restructure underlying assumptions and beliefs reflected in biased self-talk that  may put the client at risk for relapse or recurrence. 2. Facilitate and reinforce the client's shift from biased depressive self-talk and beliefs to realitybased cognitive messages that enhance self-confidence and increase adaptive actions (see  "Positive Self-Talk" in the Adult Psychotherapy Homework Planner by Stephannie Li). 4. Enhance ability to effectively cope with the full variety of life's worries  and anxieties. Objective Identify, challenge, and replace biased, fearful self-talk with positive, realistic, and empowering selftalk. Target Date: 2024-10-15 Frequency: Biweekly Progress: 80 Modality: individual Related Interventions 1. Explore the client's schema and self-talk that mediate his/her fear response; assist him/her in  challenging the biases; replace the distorted messages with reality-based alternatives and  positive, realistic self-talk that will increase his/her self-confidence in coping with irrational  fears (see Cognitive Therapy of Anxiety Disorders by Laurence Slate). Objective Learn and implement problem-solving strategies for realistically addressing worries. Target Date: 2024-10-15 Frequency: Biweekly Progress: 60 Modality: individual Therapy 5. Recognize, accept, and cope with feelings of depression. 6. Resolve conflicted feelings and adapt to the new life circumstances. 7. Stabilize anxiety level while increasing ability to function on a daily  The patient is making progress and has approved this plan of treatment.  Sylvana Bonk G Adianna Darwin, LCSW

## 2023-11-08 ENCOUNTER — Ambulatory Visit: Payer: Medicare Other | Admitting: Psychology

## 2023-11-08 DIAGNOSIS — F411 Generalized anxiety disorder: Secondary | ICD-10-CM | POA: Diagnosis not present

## 2023-11-08 DIAGNOSIS — F330 Major depressive disorder, recurrent, mild: Secondary | ICD-10-CM

## 2023-11-08 NOTE — Progress Notes (Signed)
[facility] Counselor/Therapist Progress Note  Patient ID: Kristin Sparks, MRN: [id number],    Date: 11/08/2023  Time Spent:59 minutes  Time in:  9:04 Time out: 10:03  Treatment Type: Individual Therapy  Reported Symptoms: anxiety and depression  Mental Status Exam: Appearance:  Casual     Behavior: Appropriate  Motor: Normal  Speech/Language:  Normal Rate  Affect: Blunt  Mood: pleasant  Thought process: normal  Thought content:   WNL  Sensory/Perceptual disturbances:   WNL  Orientation: oriented to person, place, time/date, and situation  Attention: Good  Concentration: Good  Memory: WNL  Fund of knowledge:  Good  Insight:   Good  Judgment:  Good  Impulse Control: Good   Risk Assessment: Danger to Self:  No Self-injurious Behavior: No Danger to Others: No Duty to Warn:no Physical Aggression / Violence:No  Access to Firearms a concern: No  Gang Involvement:No   Subjective: The patient attended a face-to-face individual therapy session via video visit.  The patient gave verbal consent to have the session on video on caregility and the patient is aware of the limitations of telehealth. The patient was in her  home alone and the therapist was in the office.  The patient presents with a blunted affect and mood is pleasant.  The patient reports that she is staying home some now.  She does have several things that she has planned coming up.  She is going to [employer] next weekend to watch her son run a marathon.  She also has a trip to [place] coming up in the summer.  The patient reports that she has been looking at housing in [place].  I gave her positive feedback for looking and we talked about how she can move into that eye in a good way.  She also talked about her son taking advantage of her and we talked about needing to have a conversation with him about him growing up and relying on himself.  She keeps avoiding this difficult conversation and I  encouraged her to have courage to say what she needs to say and that she needs to do it before she continues to get angry and frustrated about the things that he and his wife are asking of her. Interventions: Cognitive Behavioral Therapy and Assertiveness/Communication, problem solving  Diagnosis:Mild episode of recurrent major depressive disorder (HCC)  Generalized anxiety disorder  Plan:  Client Abilities/Strengths  Intelligent, caring, motivated  Client Treatment Preferences  Outpatient individual therapy  Client Statement of Needs  " I need some help to deal with my situation", "I 'm not sure if I'm depressed or what?"  Treatment Level  Outpatient Individual therapy  Symptoms  Frustration and anxiety related to providing oversight and caretaking to an aging, ailing, and dependent  parent.: No Description Entered (Status: improved). Lack of energy.: No Description Entered (Status:  improved). Motor tension (e.g., restlessness, tiredness, shakiness, muscle tension).: No Description  Entered (Status: improved). Poor concentration and indecisiveness.: No Description Entered (Status:  improved). Social withdrawal.: No Description Entered (Status: improved).  Problems Addressed  Unipolar Depression, Phase Of Life Problems, Unipolar Depression, Anxiety, Unipolar Depression,  Phase Of Life Problems, Anxiety  Goals 1. Appropriately grieve the loss in order to normalize mood and to return  to previously adaptive level of functioning. 2. Balance life activities between consideration of others and development of own interests. Objective Apply problem-solving skills to current circumstances Target Date: 2024-10-15 Frequency: Biweekly Progress: 90 Modality: individual Related Interventions 1. Use modeling and  role-playing with the client to apply the problem-solving approach to his/her  current circumstances (or assign "Applying Problem-Solving to Interpersonal Conflict" from  the Adult  Psychotherapy Homework Planner by [facility]); encourage implementation of action  plan, reinforcing success and redirecting for failure. Objective Implement new activities that increase a sense of satisfaction. Target Date: 2024-10-15 Frequency: Biweekly Progress: 90 Modality: individual Related Interventions 1. Develop a plan with the client to include activities that will increase his/her satisfaction, fulfill  his/her values, and improve the quality of his/her life. 3. Develop healthy interpersonal relationships that lead to the alleviation  and help prevent the relapse of depression. Objective Learn and implement problem-solving and decision-making skills. Target Date: 2024-10-15 Frequency: Biweekly Progress: 90 Modality: individual Related Interventions 1. Encourage in the client the development of a positive problem orientation in which problems  and solving them are viewed as a natural part of life and not something to be feared, despaired,  or avoided. 2. Conduct Problem-Solving Therapy  Implement new activities that increase a sense of satisfaction. Target Date: 2024-10-15 Frequency: Biweekly Progress: 90 Modality: individual Related Interventions 1. Develop a plan with the client to include activities that will increase his/her satisfaction, fulfill  his/her values, and improve the quality of his/her life. 3. Develop healthy interpersonal relationships that lead to the alleviation  and help prevent the relapse of depression. Objective Learn and implement problem-solving and decision-making skills. Target Date: 2024-10-15 Frequency: Biweekly Progress: 90 Modality: individual Related Interventions 1. Encourage in the client the development of a positive problem orientation in which problems  and solving them are viewed as a natural part of life and not something to be feared, despaired,  or avoided.  Objective Identify and replace thoughts and beliefs that support  depression. Target Date: 2024-10-15 Frequency: Biweekly Progress: 90 Modality: individual Related Interventions 1. Explore and restructure underlying assumptions and beliefs reflected in biased self-talk that  may put the client at risk for relapse or recurrence. 2. Facilitate and reinforce the client's shift from biased depressive self-talk and beliefs to realitybased cognitive messages that enhance self-confidence and increase adaptive actions (see  "Positive Self-Talk" in the Adult Psychotherapy Homework Planner by Stephannie Li). 4. Enhance ability to effectively cope with the full variety of life's worries  and anxieties. Objective Identify, challenge, and replace biased, fearful self-talk with positive, realistic, and empowering selftalk. Target Date: 2024-10-15 Frequency: Biweekly Progress: 80 Modality: individual Related Interventions 1. Explore the client's schema and self-talk that mediate his/her fear response; assist him/her in  challenging the biases; replace the distorted messages with reality-based alternatives and  positive, realistic self-talk that will increase his/her self-confidence in coping with irrational  fears (see Cognitive Therapy of Anxiety Disorders by Laurence Slate). Objective Learn and implement problem-solving strategies for realistically addressing worries. Target Date: 2024-10-15 Frequency: Biweekly Progress: 60 Modality: individual Therapy 5. Recognize, accept, and cope with feelings of depression. 6. Resolve conflicted feelings and adapt to the new life circumstances. 7. Stabilize anxiety level while increasing ability to function on a daily  The patient is making progress and has approved this plan of treatment.  Ailee Pates G Oswaldo Cueto, LCSW

## 2023-11-22 ENCOUNTER — Ambulatory Visit: Payer: Medicare Other | Admitting: Psychology

## 2023-11-22 NOTE — Progress Notes (Unsigned)
 [name] [address] [place] [address] Phone: [phone] Subjective:   [name], am serving as a scribe for Dr. Antoine Primas.  I'm seeing this patient by the request  of:  Hande, Roderic Palau, MD  CC: left knee pain   [id number]  09/14/2023  Patient does have a lot of instability with this.  Is trying to still run on a relatively regular basis.  Will start to adapt and do more low impact exercises.  Does have the OA stability brace that I think will be helpful.  Viscosupplementation given today.  If continuing to have pain could be a candidate for possible PRP as well.  Follow-up with me again in 2 to 3 months.     Update 11/23/2023 Kristin Sparks is a 68 y.o. female coming in with complaint of L knee pain. Durolane given in December 2024. Patient states that she feels more stable but her pain still persists over medial aspect. No relief with gel injection.   Took 6 weeks off from running. Did walk and use stationary bike at gym. Patient states walking causes more pain than running.   R knee is starting to hurt over medial aspect.     Past Medical History:  Diagnosis Date   Allergic rhinitis    Asthma    Elevated cholesterol    Neuropathy    Osteoarthritis    Past Surgical History:  Procedure Laterality Date   NASAL SINUS SURGERY     OVARIAN CYST REMOVAL     Social History   Socioeconomic History   Marital status: Widowed    Spouse name: Not on file   Number of children: Not on file   Years of education: Not on file   Highest education level: Not on file  Occupational History   Not on file  Tobacco Use   Smoking status: Never   Smokeless tobacco: Never  Substance and Sexual Activity   Alcohol use: Not on file   Drug use: Not on file   Sexual activity: Not on file  Other Topics Concern   Not on file  Social History Narrative   Not on file   Social Drivers of Health   Financial Resource Strain: Low Risk   (10/24/2023)   [facility]   Overall Financial Resource Strain (CARDIA)    Difficulty of Paying Living Expenses: Not hard at all  Food Insecurity: No Food Insecurity (10/24/2023)   Received [facility]   Hunger Vital Sign    Worried About Running Out of Food in the Last Year: Never true    Ran Out of Food in the Last Year: Never true  Transportation Needs: No Transportation Needs (10/24/2023)   Received from [facility] - Transportation    In the past 12 months, has lack of transportation kept you from medical appointments or from getting medications?: No    Lack of Transportation (Non-Medical): No  Physical Activity: Not on file  Stress: Not on file  Social Connections: Not on file   Allergies  Allergen Reactions   Latex    Sulfa Antibiotics Rash   Family History  Problem Relation Age of Onset   Alcohol abuse Father    Depression Sister    Breast cancer Maternal Grandmother      Current Outpatient Medications (Cardiovascular):    lovastatin (MEVACOR) 20 MG tablet, Take 20 mg by mouth once a week.  minoxidil (LONITEN) 2.5 MG tablet, Take 1.25 mg by mouth daily.  Current Outpatient Medications (Respiratory):    albuterol (PROVENTIL HFA;VENTOLIN HFA) 108 (90 Base) MCG/ACT inhaler, Inhale into the lungs.   Fluticasone-Salmeterol (ADVAIR DISKUS) 250-50 MCG/DOSE AEPB, Inhale into the lungs.   levocetirizine (XYZAL) 5 MG tablet, Take 5 mg by mouth daily as needed.   Current Outpatient Medications (Analgesics):    acetaminophen (TYLENOL) 650 MG CR tablet, Take 650 mg by mouth every 8 (eight) hours as needed for pain.   meloxicam (MOBIC) 7.5 MG tablet, Take 7.5 mg by mouth as needed.   Current Outpatient Medications (Other):    buPROPion ER (WELLBUTRIN SR) 100 MG 12 hr tablet, Take 1 tablet (100 mg total) by mouth every morning.   busPIRone (BUSPAR) 15 MG tablet, Take 1 tablet (15 mg total) by  mouth every morning.   calcium-vitamin D (OSCAL-500) 500-400 MG-UNIT tablet, Take by mouth.   Cholecalciferol (VITAMIN D3) 50 MCG (2000 UT) CAPS, Take by mouth.   DULoxetine (CYMBALTA) 20 MG capsule, TAKE 2 CAPSULES BY MOUTH EVERY  MORNING   Omega-3 1000 MG CAPS, Take by mouth.   pregabalin (LYRICA) 75 MG capsule, Take 1 capsule (75 mg total) by mouth every evening.   TART CHERRY PO, Take by mouth.   traZODone (DESYREL) 50 MG tablet, Take 1/2-2 tabs po QHS prn insomnia   hydrOXYzine (ATARAX/VISTARIL) 10 MG tablet, Take 1 tablet (10 mg total) by mouth at bedtime as needed for up to 30 days.   Reviewed prior external information including notes and imaging from  primary care provider As well as notes that were available from care everywhere and other healthcare systems.  Past medical history, social, surgical and family history all reviewed in electronic medical record.  No pertanent information unless stated regarding to the chief complaint.   Review of Systems:  No headache, visual changes, nausea, vomiting, diarrhea, constipation, dizziness, abdominal pain, skin rash, fevers, chills, night sweats, weight loss, swollen lymph nodes, body aches, joint swelling, chest pain, shortness of breath, mood changes. POSITIVE muscle aches  Objective  Blood pressure 112/78, pulse 60, height 5\' 4"  (1.626 m), weight 148 lb (67.1 kg), SpO2 99%.   General: No apparent distress alert and oriented x3 mood and affect normal, dressed appropriately.  HEENT: Pupils equal, extraocular movements intact  Respiratory: Patient's speak in full sentences and does not appear short of breath  Cardiovascular: No lower extremity edema, non tender, no erythema   Knee exam shows trace effusion noted.  Positive McMurray's noted.  Tender to palpation to diffusely on the knee but seems to be more on the medial joint line.   Impression and Recommendations:    The above documentation has been reviewed and is accurate and  complete Judi Saa, DO

## 2023-11-23 ENCOUNTER — Encounter: Payer: Self-pay | Admitting: Family Medicine

## 2023-11-23 ENCOUNTER — Ambulatory Visit (INDEPENDENT_AMBULATORY_CARE_PROVIDER_SITE_OTHER): Payer: Medicare Other | Admitting: Family Medicine

## 2023-11-23 VITALS — BP 112/78 | HR 60 | Ht 64.0 in | Wt 148.0 lb

## 2023-11-23 DIAGNOSIS — M25562 Pain in left knee: Secondary | ICD-10-CM | POA: Diagnosis not present

## 2023-11-23 DIAGNOSIS — M1712 Unilateral primary osteoarthritis, left knee: Secondary | ICD-10-CM | POA: Diagnosis not present

## 2023-11-23 DIAGNOSIS — G8929 Other chronic pain: Secondary | ICD-10-CM

## 2023-11-23 NOTE — Assessment & Plan Note (Signed)
 Failed conservative therapy patient has had not done well with the the steroid injection or the viscosupplementation.  Discussed another steroid injection is a possibility.  Patient wants to hold at this moment.  I do believe that there is still some instability of the knee, is still affecting daily activities and patient is unable to increase activity at the moment.  Do feel that we need to consider advanced imaging first with patient's arthritis that could be still a possibility of an arthroscopic procedure versus the possibility of a knee replacement.  Will rule out such things as meniscal injury or other cartilage that could be potentially contributing.  Discussed with patient about icing regimen and home exercises.  Discussed avoiding certain activities.  Follow-up again in 6 to 8 weeks

## 2023-11-23 NOTE — Patient Instructions (Signed)
 MRI L knee [employer] Wear brace with walking dogs I will be in contact

## 2023-12-03 ENCOUNTER — Ambulatory Visit
Admission: RE | Admit: 2023-12-03 | Discharge: 2023-12-03 | Disposition: A | Discharge status: Home / Self Care | Payer: Medicare Other | Source: Ambulatory Visit | Attending: Family Medicine | Admitting: Family Medicine

## 2023-12-03 DIAGNOSIS — G8929 Other chronic pain: Secondary | ICD-10-CM

## 2023-12-06 ENCOUNTER — Ambulatory Visit: Payer: Medicare Other | Admitting: Psychology

## 2023-12-06 DIAGNOSIS — F330 Major depressive disorder, recurrent, mild: Secondary | ICD-10-CM | POA: Diagnosis not present

## 2023-12-06 DIAGNOSIS — F411 Generalized anxiety disorder: Secondary | ICD-10-CM | POA: Diagnosis not present

## 2023-12-06 NOTE — Progress Notes (Unsigned)
 [facility] Counselor/Therapist Progress Note  Patient ID: Kristin Sparks, MRN: [id number],    Date: 12/06/2023  Time Spent:61 minutes  Time in:  9:02 Time out: 10:03  Treatment Type: Individual Therapy  Reported Symptoms: anxiety and depression  Mental Status Exam: Appearance:  Casual     Behavior: Appropriate  Motor: Normal  Speech/Language:  Normal Rate  Affect: Blunt  Mood: pleasant  Thought process: normal  Thought content:   WNL  Sensory/Perceptual disturbances:   WNL  Orientation: oriented to person, place, time/date, and situation  Attention: Good  Concentration: Good  Memory: WNL  Fund of knowledge:  Good  Insight:   Good  Judgment:  Good  Impulse Control: Good   Risk Assessment: Danger to Self:  No Self-injurious Behavior: No Danger to Others: No Duty to Warn:no Physical Aggression / Violence:No  Access to Firearms a concern: No  Gang Involvement:No   Subjective: The patient attended a face-to-face individual therapy session via video visit.  The patient gave verbal consent to have the session on video on caregility and the patient is aware of the limitations of telehealth. The patient was in her  home alone and the therapist was in the office.  The patient presents with a blunted affect and mood is pleasant.  The patient reports that she did go to [place] with her son and daughter-in-law and they ran a marathon and did very well.  She talked today about being frustrated about having Kristin Sparks, his dog, and feeling like she cannot take care of her.  Doy Mince is a black lab mix and apparently is a very strong dog and has a lot of anxiety and the patient reports that she feels like she is getting older and having a harder time managing her.  Her son does not tend to get her very often and she has ended up being the patient's dog.  We talked about the possibility of her reading homing her and I encouraged her to think about doing that as she is very afraid of  getting hurt and she likely will if she continues to have to manage her on a regular basis.  She feels bad about doing it but realizes that is going to be a problem.  She still has not done anything about moving.  We will continue to talk about how she can move forward with making these decisions for herself.  Interventions: Cognitive Behavioral Therapy and Assertiveness/Communication, problem solving  Diagnosis:Mild episode of recurrent major depressive disorder (HCC)  Generalized anxiety disorder  Plan:  Client Abilities/Strengths  Intelligent, caring, motivated  Client Treatment Preferences  Outpatient individual therapy  Client Statement of Needs  " I need some help to deal with my situation", "I 'm not sure if I'm depressed or what?"  Treatment Level  Outpatient Individual therapy  Symptoms  Frustration and anxiety related to providing oversight and caretaking to an aging, ailing, and dependent  parent.: No Description Entered (Status: improved). Lack of energy.: No Description Entered (Status:  improved). Motor tension (e.g., restlessness, tiredness, shakiness, muscle tension).: No Description  Entered (Status: improved). Poor concentration and indecisiveness.: No Description Entered (Status:  improved). Social withdrawal.: No Description Entered (Status: improved).  Problems Addressed  Unipolar Depression, Phase Of Life Problems, Unipolar Depression, Anxiety, Unipolar Depression,  Phase Of Life Problems, Anxiety  Goals 1. Appropriately grieve the loss in order to normalize mood and to return  to previously adaptive level of functioning. 2. Balance life activities between consideration of others and  development of own interests. Objective Apply problem-solving skills to current circumstances Target Date: 2024-10-15 Frequency: Biweekly Progress: 90 Modality: individual Related Interventions 1. Use modeling and role-playing with the client to apply the problem-solving  approach to his/her  current circumstances (or assign "Applying Problem-Solving to Interpersonal Conflict" from  the Adult Psychotherapy Homework Planner by [facility]); encourage implementation of action  plan, reinforcing success and redirecting for failure. Objective Implement new activities that increase a sense of satisfaction. Target Date: 2024-10-15 Frequency: Biweekly Progress: 90 Modality: individual Related Interventions 1. Develop a plan with the client to include activities that will increase his/her satisfaction, fulfill  his/her values, and improve the quality of his/her life. 3. Develop healthy interpersonal relationships that lead to the alleviation  and help prevent the relapse of depression. Objective Learn and implement problem-solving and decision-making skills. Target Date: 2024-10-15 Frequency: Biweekly Progress: 90 Modality: individual Related Interventions 1. Encourage in the client the development of a positive problem orientation in which problems  and solving them are viewed as a natural part of life and not something to be feared, despaired,  or avoided. 2. Conduct Problem-Solving Therapy  Implement new activities that increase a sense of satisfaction. Target Date: 2024-10-15 Frequency: Biweekly Progress: 90 Modality: individual Related Interventions 1. Develop a plan with the client to include activities that will increase his/her satisfaction, fulfill  his/her values, and improve the quality of his/her life. 3. Develop healthy interpersonal relationships that lead to the alleviation  and help prevent the relapse of depression. Objective Learn and implement problem-solving and decision-making skills. Target Date: 2024-10-15 Frequency: Biweekly Progress: 90 Modality: individual Related Interventions 1. Encourage in the client the development of a positive problem orientation in which problems  and solving them are viewed as a natural part of life and not  something to be feared, despaired,  or avoided.  Objective Identify and replace thoughts and beliefs that support depression. Target Date: 2024-10-15 Frequency: Biweekly Progress: 90 Modality: individual Related Interventions 1. Explore and restructure underlying assumptions and beliefs reflected in biased self-talk that  may put the client at risk for relapse or recurrence. 2. Facilitate and reinforce the client's shift from biased depressive self-talk and beliefs to realitybased cognitive messages that enhance self-confidence and increase adaptive actions (see  "Positive Self-Talk" in the Adult Psychotherapy Homework Planner by Stephannie Li). 4. Enhance ability to effectively cope with the full variety of life's worries  and anxieties. Objective Identify, challenge, and replace biased, fearful self-talk with positive, realistic, and empowering selftalk. Target Date: 2024-10-15 Frequency: Biweekly Progress: 80 Modality: individual Related Interventions 1. Explore the client's schema and self-talk that mediate his/her fear response; assist him/her in  challenging the biases; replace the distorted messages with reality-based alternatives and  positive, realistic self-talk that will increase his/her self-confidence in coping with irrational  fears (see Cognitive Therapy of Anxiety Disorders by Laurence Slate). Objective Learn and implement problem-solving strategies for realistically addressing worries. Target Date: 2024-10-15 Frequency: Biweekly Progress: 60 Modality: individual Therapy 5. Recognize, accept, and cope with feelings of depression. 6. Resolve conflicted feelings and adapt to the new life circumstances. 7. Stabilize anxiety level while increasing ability to function on a daily  The patient is making progress and has approved this plan of treatment.  Desmond Tufano G Salia Cangemi,  LCSW

## 2023-12-13 ENCOUNTER — Encounter: Payer: Self-pay | Admitting: Family Medicine

## 2023-12-14 ENCOUNTER — Other Ambulatory Visit: Payer: Self-pay

## 2023-12-14 DIAGNOSIS — M1712 Unilateral primary osteoarthritis, left knee: Secondary | ICD-10-CM

## 2023-12-20 ENCOUNTER — Ambulatory Visit (INDEPENDENT_AMBULATORY_CARE_PROVIDER_SITE_OTHER): Payer: Medicare Other | Admitting: Psychology

## 2023-12-20 DIAGNOSIS — F330 Major depressive disorder, recurrent, mild: Secondary | ICD-10-CM

## 2023-12-20 DIAGNOSIS — F411 Generalized anxiety disorder: Secondary | ICD-10-CM | POA: Diagnosis not present

## 2023-12-20 NOTE — Progress Notes (Signed)
 [facility] Counselor/Therapist Progress Note  Patient ID: Kristin Sparks, MRN: [id number],    Date: 12/20/2023  Time Spent:57 minutes  Time in:  9:04 Time out: 10:01  Treatment Type: Individual Therapy  Reported Symptoms: anxiety and depression  Mental Status Exam: Appearance:  Casual     Behavior: Appropriate  Motor: Normal  Speech/Language:  Normal Rate  Affect: Blunt  Mood: pleasant  Thought process: normal  Thought content:   WNL  Sensory/Perceptual disturbances:   WNL  Orientation: oriented to person, place, time/date, and situation  Attention: Good  Concentration: Good  Memory: WNL  Fund of knowledge:  Good  Insight:   Good  Judgment:  Good  Impulse Control: Good   Risk Assessment: Danger to Self:  No Self-injurious Behavior: No Danger to Others: No Duty to Warn:no Physical Aggression / Violence:No  Access to Firearms a concern: No  Gang Involvement:No   Subjective: The patient attended a face-to-face individual therapy session via video visit.  The patient gave verbal consent to have the session on video on caregility and the patient is aware of the limitations of telehealth. The patient was in her  home alone and the therapist was in the office.  The patient presents with a blunted affect and mood is pleasant.  The patient reports that she is going on several trips coming up and feels good about that.  She did talk to her son about re homing Doy Mince and he does not want to but Doy Mince is still with her the majority of the time.  We talked about the need for them to revisit this because the patient is getting to the point where she cannot really manage her as well physically.  The patient continues to want to look in [place] for a place to live and she does seem to make some movement in regards to that.  The patient seems to be doing well and she still has anxiety but she is doing more of the things that she enjoys and she seems to be doing a better job of  managing her time to work those things in. Interventions: Cognitive Behavioral Therapy and Assertiveness/Communication, problem solving  Diagnosis:Mild episode of recurrent major depressive disorder (HCC)  Generalized anxiety disorder  Plan:  Client Abilities/Strengths  Intelligent, caring, motivated  Client Treatment Preferences  Outpatient individual therapy  Client Statement of Needs  " I need some help to deal with my situation", "I 'm not sure if I'm depressed or what?"  Treatment Level  Outpatient Individual therapy  Symptoms  Frustration and anxiety related to providing oversight and caretaking to an aging, ailing, and dependent  parent.: No Description Entered (Status: improved). Lack of energy.: No Description Entered (Status:  improved). Motor tension (e.g., restlessness, tiredness, shakiness, muscle tension).: No Description  Entered (Status: improved). Poor concentration and indecisiveness.: No Description Entered (Status:  improved). Social withdrawal.: No Description Entered (Status: improved).  Problems Addressed  Unipolar Depression, Phase Of Life Problems, Unipolar Depression, Anxiety, Unipolar Depression,  Phase Of Life Problems, Anxiety  Goals 1. Appropriately grieve the loss in order to normalize mood and to return  to previously adaptive level of functioning. 2. Balance life activities between consideration of others and development of own interests. Objective Apply problem-solving skills to current circumstances Target Date: 2024-10-15 Frequency: Biweekly Progress: 90 Modality: individual Related Interventions 1. Use modeling and role-playing with the client to apply the problem-solving approach to his/her  current circumstances (or assign "Applying Problem-Solving to Interpersonal Conflict" from  the Adult Psychotherapy Homework Planner by Stephannie Li); encourage implementation of action  plan, reinforcing success and redirecting for  failure. Objective Implement new activities that increase a sense of satisfaction. Target Date: 2024-10-15 Frequency: Biweekly Progress: 90 Modality: individual Related Interventions 1. Develop a plan with the client to include activities that will increase his/her satisfaction, fulfill  his/her values, and improve the quality of his/her life. 3. Develop healthy interpersonal relationships that lead to the alleviation  and help prevent the relapse of depression. Objective Learn and implement problem-solving and decision-making skills. Target Date: 2024-10-15 Frequency: Biweekly Progress: 90 Modality: individual Related Interventions 1. Encourage in the client the development of a positive problem orientation in which problems  and solving them are viewed as a natural part of life and not something to be feared, despaired,  or avoided. 2. Conduct Problem-Solving Therapy  Implement new activities that increase a sense of satisfaction. Target Date: 2024-10-15 Frequency: Biweekly Progress: 90 Modality: individual Related Interventions 1. Develop a plan with the client to include activities that will increase his/her satisfaction, fulfill  his/her values, and improve the quality of his/her life. 3. Develop healthy interpersonal relationships that lead to the alleviation  and help prevent the relapse of depression. Objective Learn and implement problem-solving and decision-making skills. Target Date: 2024-10-15 Frequency: Biweekly Progress: 90 Modality: individual Related Interventions 1. Encourage in the client the development of a positive problem orientation in which problems  and solving them are viewed as a natural part of life and not something to be feared, despaired,  or avoided.  Objective Identify and replace thoughts and beliefs that support depression. Target Date: 2024-10-15 Frequency: Biweekly Progress: 90 Modality: individual Related Interventions 1. Explore and  restructure underlying assumptions and beliefs reflected in biased self-talk that  may put the client at risk for relapse or recurrence. 2. Facilitate and reinforce the client's shift from biased depressive self-talk and beliefs to realitybased cognitive messages that enhance self-confidence and increase adaptive actions (see  "Positive Self-Talk" in the Adult Psychotherapy Homework Planner by Stephannie Li). 4. Enhance ability to effectively cope with the full variety of life's worries  and anxieties. Objective Identify, challenge, and replace biased, fearful self-talk with positive, realistic, and empowering selftalk. Target Date: 2024-10-15 Frequency: Biweekly Progress: 80 Modality: individual Related Interventions 1. Explore the client's schema and self-talk that mediate his/her fear response; assist him/her in  challenging the biases; replace the distorted messages with reality-based alternatives and  positive, realistic self-talk that will increase his/her self-confidence in coping with irrational  fears (see Cognitive Therapy of Anxiety Disorders by Laurence Slate). Objective Learn and implement problem-solving strategies for realistically addressing worries. Target Date: 2024-10-15 Frequency: Biweekly Progress: 60 Modality: individual Therapy 5. Recognize, accept, and cope with feelings of depression. 6. Resolve conflicted feelings and adapt to the new life circumstances. 7. Stabilize anxiety level while increasing ability to function on a daily  The patient is making progress and has approved this plan of treatment.  Earon Rivest G Aritza Brunet, LCSW

## 2024-01-03 ENCOUNTER — Ambulatory Visit: Payer: Medicare Other | Admitting: Psychology

## 2024-01-17 ENCOUNTER — Ambulatory Visit (INDEPENDENT_AMBULATORY_CARE_PROVIDER_SITE_OTHER): Payer: Medicare Other | Admitting: Psychology

## 2024-01-17 DIAGNOSIS — F330 Major depressive disorder, recurrent, mild: Secondary | ICD-10-CM

## 2024-01-17 DIAGNOSIS — F411 Generalized anxiety disorder: Secondary | ICD-10-CM | POA: Diagnosis not present

## 2024-01-17 NOTE — Progress Notes (Signed)
 [facility] Counselor/Therapist Progress Note  Patient ID: Kristin Sparks, MRN: [id number],    Date: 01/17/2024  Time Spent:59 minutes  Time in:  9:03 Time out: 10:02  Treatment Type: Individual Therapy  Reported Symptoms: anxiety and depression  Mental Status Exam: Appearance:  Casual     Behavior: Appropriate  Motor: Normal  Speech/Language:  Normal Rate  Affect: Blunt  Mood: pleasant  Thought process: normal  Thought content:   WNL  Sensory/Perceptual disturbances:   WNL  Orientation: oriented to person, place, time/date, and situation  Attention: Good  Concentration: Good  Memory: WNL  Fund of knowledge:  Good  Insight:   Good  Judgment:  Good  Impulse Control: Good   Risk Assessment: Danger to Self:  No Self-injurious Behavior: No Danger to Others: No Duty to Warn:no Physical Aggression / Violence:No  Access to Firearms a concern: No  Gang Involvement:No   Subjective: The patient attended a face-to-face individual therapy session via video visit.  The patient gave verbal consent to have the session on video on caregility and the patient is aware of the limitations of telehealth. The patient was in her  home alone and the therapist was in the office.  The patient presents with a blunted affect and mood is pleasant.  The patient reports that she went to [place] and to the [employer] and had a lovely time.  She also has gotten a Customer service manager in [place].  She is making good progress in therapy.  The patient reports that she feels like she is doing better and is getting more ready to move.  The patient reports that Camilo Cella went up on her medications when she saw her last.    Interventions: Cognitive Behavioral Therapy and Assertiveness/Communication, problem solving  Diagnosis:Mild episode of recurrent major depressive disorder (HCC)  Generalized anxiety disorder  Plan:  Client Abilities/Strengths  Intelligent, caring, motivated  Client  Treatment Preferences  Outpatient individual therapy  Client Statement of Needs  " I need some help to deal with my situation", "I 'm not sure if I'm depressed or what?"  Treatment Level  Outpatient Individual therapy  Symptoms  Frustration and anxiety related to providing oversight and caretaking to an aging, ailing, and dependent  parent.: No Description Entered (Status: improved). Lack of energy.: No Description Entered (Status:  improved). Motor tension (e.g., restlessness, tiredness, shakiness, muscle tension).: No Description  Entered (Status: improved). Poor concentration and indecisiveness.: No Description Entered (Status:  improved). Social withdrawal.: No Description Entered (Status: improved).  Problems Addressed  Unipolar Depression, Phase Of Life Problems, Unipolar Depression, Anxiety, Unipolar Depression,  Phase Of Life Problems, Anxiety  Goals 1. Appropriately grieve the loss in order to normalize mood and to return  to previously adaptive level of functioning. 2. Balance life activities between consideration of others and development of own interests. Objective Apply problem-solving skills to current circumstances Target Date: 2024-10-15 Frequency: Biweekly Progress: 90 Modality: individual Related Interventions 1. Use modeling and role-playing with the client to apply the problem-solving approach to his/her  current circumstances (or assign "Applying Problem-Solving to Interpersonal Conflict" from  the Adult Psychotherapy Homework Planner by [facility]); encourage implementation of action  plan, reinforcing success and redirecting for failure. Objective Implement new activities that increase a sense of satisfaction. Target Date: 2024-10-15 Frequency: Biweekly Progress: 90 Modality: individual Related Interventions 1. Develop a plan with the client to include activities that will increase his/her satisfaction, fulfill  his/her values, and improve the quality of his/her  life.  3. Develop healthy interpersonal relationships that lead to the alleviation  and help prevent the relapse of depression. Objective Learn and implement problem-solving and decision-making skills. Target Date: 2024-10-15 Frequency: Biweekly Progress: 90 Modality: individual Related Interventions 1. Encourage in the client the development of a positive problem orientation in which problems  and solving them are viewed as a natural part of life and not something to be feared, despaired,  or avoided. 2. Conduct Problem-Solving Therapy  Implement new activities that increase a sense of satisfaction. Target Date: 2024-10-15 Frequency: Biweekly Progress: 90 Modality: individual Related Interventions 1. Develop a plan with the client to include activities that will increase his/her satisfaction, fulfill  his/her values, and improve the quality of his/her life. 3. Develop healthy interpersonal relationships that lead to the alleviation  and help prevent the relapse of depression. Objective Learn and implement problem-solving and decision-making skills. Target Date: 2024-10-15 Frequency: Biweekly Progress: 90 Modality: individual Related Interventions 1. Encourage in the client the development of a positive problem orientation in which problems  and solving them are viewed as a natural part of life and not something to be feared, despaired,  or avoided.  Objective Identify and replace thoughts and beliefs that support depression. Target Date: 2024-10-15 Frequency: Biweekly Progress: 90 Modality: individual Related Interventions 1. Explore and restructure underlying assumptions and beliefs reflected in biased self-talk that  may put the client at risk for relapse or recurrence. 2. Facilitate and reinforce the client's shift from biased depressive self-talk and beliefs to realitybased cognitive messages that enhance self-confidence and increase adaptive actions (see  "Positive  Self-Talk" in the Adult Psychotherapy Homework Planner by Beacher Bottoms). 4. Enhance ability to effectively cope with the full variety of life's worries  and anxieties. Objective Identify, challenge, and replace biased, fearful self-talk with positive, realistic, and empowering selftalk. Target Date: 2024-10-15 Frequency: Biweekly Progress: 80 Modality: individual Related Interventions 1. Explore the client's schema and self-talk that mediate his/her fear response; assist him/her in  challenging the biases; replace the distorted messages with reality-based alternatives and  positive, realistic self-talk that will increase his/her self-confidence in coping with irrational  fears (see Cognitive Therapy of Anxiety Disorders by Graziani Kaufman). Objective Learn and implement problem-solving strategies for realistically addressing worries. Target Date: 2024-10-15 Frequency: Biweekly Progress: 60 Modality: individual Therapy 5. Recognize, accept, and cope with feelings of depression. 6. Resolve conflicted feelings and adapt to the new life circumstances. 7. Stabilize anxiety level while increasing ability to function on a daily  The patient is making progress and has approved this plan of treatment.  Chavy Avera G Freddy Kinne, LCSW

## 2024-01-31 ENCOUNTER — Ambulatory Visit (INDEPENDENT_AMBULATORY_CARE_PROVIDER_SITE_OTHER): Payer: Medicare Other | Admitting: Psychology

## 2024-01-31 DIAGNOSIS — F330 Major depressive disorder, recurrent, mild: Secondary | ICD-10-CM | POA: Diagnosis not present

## 2024-01-31 DIAGNOSIS — F411 Generalized anxiety disorder: Secondary | ICD-10-CM

## 2024-01-31 NOTE — Progress Notes (Unsigned)
                edge,  experiencing concentration difficulties, having trouble falling or staying asleep, exhibiting a general  state of irritability).: No Description Entered (Status: improved). Motor tension (e.g., restlessness,  tiredness, shakiness, muscle tension).: No Description Entered (Status: improved).  Problems Addressed  Anxiety, Phase Of Life Problems, Anxiety  Goals 1. Learn and implement coping skills that result in a reduction of anxiety  and worry, and improved daily functioning. Objective Learn  and implement calming skills to reduce overall anxiety and manage anxiety symptoms. Target Date: 2025-08-09Frequency: Weekly Progress: 40 Modality: individual  Related Interventions 1. Teach the client calming/relaxation skills (e.g., applied relaxation, progressive muscle  relaxation, cue controlled relaxation; mindful breathing; biofeedback) and how to discriminate  better between relaxation and tension; teach the client how to apply these skills to his/her daily  life (e.g., New Directions in Progressive Muscle Relaxation by Marcelyn Ditty, and  Hazlett-Stevens; Treating Generalized Anxiety Disorder by Rygh and Ida Rogue). Objective Identify, challenge, and replace biased, fearful self-talk with positive, realistic, and empowering selftalk. Target Date: 2024-05-05 Frequency: weekly Progress: 30 Modality: individual Related Interventions 1. Explore the client's schema and self-talk that mediate his/her fear response; assist him/her in  challenging the biases; replace the distorted messages with reality-based alternatives and  positive, realistic self-talk that will increase his/her self-confidence in coping with irrational  fears (see Cognitive Therapy of Anxiety Disorders by Laurence Slate). Objective Learn and implement problem-solving strategies for realistically addressing worries. Target Date: 2025-08-09Frequency: weekly Progress: 40 Modality: individual 2. Resolve conflicted feelings and adapt to the new life circumstances. Objective Apply problem-solving skills to current circumstances. Target Date: 2024-05-05 Frequency: weekly Progress: 20 Modality: individual Related Interventions 1. Teach the client problem-resolution skills (e.g., defining the problem clearly, brainstorming  multiple solutions, listing the pros and cons of each solution, seeking input from others,  selecting and implementing a plan of action, evaluating outcome, and readjusting plan as   necessary).   3. Stabilize anxiety level while increasing ability to function on a daily  basis. 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  The patient approved this plan.   Deonna Krummel G Ethridge Sollenberger, LCSW

## 2024-02-14 ENCOUNTER — Ambulatory Visit: Payer: Medicare Other | Admitting: Psychology

## 2024-02-21 ENCOUNTER — Ambulatory Visit (INDEPENDENT_AMBULATORY_CARE_PROVIDER_SITE_OTHER): Admitting: Psychology

## 2024-02-21 DIAGNOSIS — F330 Major depressive disorder, recurrent, mild: Secondary | ICD-10-CM

## 2024-02-21 DIAGNOSIS — F411 Generalized anxiety disorder: Secondary | ICD-10-CM | POA: Diagnosis not present

## 2024-02-21 NOTE — Progress Notes (Signed)
 [facility] Counselor/Therapist Progress Note  Patient ID: Kristin Sparks, MRN: [id number],    Date: 02/21/2024  Time Spent:56 minutes  Time in:  10:06 Time out: 11:02  Treatment Type: Individual Therapy  Reported Symptoms: anxiety and depression  Mental Status Exam: Appearance:  Casual     Behavior: Appropriate  Motor: Normal  Speech/Language:  Normal Rate  Affect: Blunt  Mood: pleasant  Thought process: normal  Thought content:   WNL  Sensory/Perceptual disturbances:   WNL  Orientation: oriented to person, place, time/date, and situation  Attention: Good  Concentration: Good  Memory: WNL  Fund of knowledge:  Good  Insight:   Good  Judgment:  Good  Impulse Control: Good   Risk Assessment: Danger to Self:  No Self-injurious Behavior: No Danger to Others: No Duty to Warn:no Physical Aggression / Violence:No  Access to Firearms a concern: No  Gang Involvement:No   Subjective: The patient attended a face-to-face individual therapy session via video visit.  The patient gave verbal consent to have the session on video on caregility and the patient is aware of the limitations of telehealth. The patient was in her  home alone and the therapist was in the office.  The patient presents with a blunted affect and mood is pleasant.  The patient reports that she leaves for [place] on Monday of next week.  She states that she has her close laying on the bed and she is trying to pair things down so that she does not have so much to take with her.  We talked about some of her anxieties about going and being on the plane for so long and he has already started doing somewhat if thinking.  We talked about her making reassuring self statements and trying to stay away from the thoughts about what if I have a panic attack or something such as that.  The patient is now doing a lot of the things we had talked about doing for so long and she seems to be making some good progress in  therapy. Interventions: Cognitive Behavioral Therapy and Assertiveness/Communication, problem solving  Diagnosis:Mild episode of recurrent major depressive disorder (HCC)  Generalized anxiety disorder  Plan:  Client Abilities/Strengths  Intelligent, caring, motivated  Client Treatment Preferences  Outpatient individual therapy  Client Statement of Needs  " I need some help to deal with my situation", "I 'm not sure if I'm depressed or what?"  Treatment Level  Outpatient Individual therapy  Symptoms  Frustration and anxiety related to providing oversight and caretaking to an aging, ailing, and dependent  parent.: No Description Entered (Status: improved). Lack of energy.: No Description Entered (Status:  improved). Motor tension (e.g., restlessness, tiredness, shakiness, muscle tension).: No Description  Entered (Status: improved). Poor concentration and indecisiveness.: No Description Entered (Status:  improved). Social withdrawal.: No Description Entered (Status: improved).  Problems Addressed  Unipolar Depression, Phase Of Life Problems, Unipolar Depression, Anxiety, Unipolar Depression,  Phase Of Life Problems, Anxiety  Goals 1. Appropriately grieve the loss in order to normalize mood and to return  to previously adaptive level of functioning. 2. Balance life activities between consideration of others and development of own interests. Objective Apply problem-solving skills to current circumstances Target Date: 2024-10-15 Frequency: Biweekly Progress: 90 Modality: individual Related Interventions 1. Use modeling and role-playing with the client to apply the problem-solving approach to his/her  current circumstances (or assign "Applying Problem-Solving to Interpersonal Conflict" from  the Adult Psychotherapy Homework Planner by Beacher Bottoms); encourage implementation of  action  plan, reinforcing success and redirecting for failure. Objective Implement new activities that increase  a sense of satisfaction. Target Date: 2024-10-15 Frequency: Biweekly Progress: 90 Modality: individual Related Interventions 1. Develop a plan with the client to include activities that will increase his/her satisfaction, fulfill  his/her values, and improve the quality of his/her life. 3. Develop healthy interpersonal relationships that lead to the alleviation  and help prevent the relapse of depression. Objective Learn and implement problem-solving and decision-making skills. Target Date: 2024-10-15 Frequency: Biweekly Progress: 90 Modality: individual Related Interventions 1. Encourage in the client the development of a positive problem orientation in which problems  and solving them are viewed as a natural part of life and not something to be feared, despaired,  or avoided. 2. Conduct Problem-Solving Therapy  Implement new activities that increase a sense of satisfaction. Target Date: 2024-10-15 Frequency: Biweekly Progress: 90 Modality: individual Related Interventions 1. Develop a plan with the client to include activities that will increase his/her satisfaction, fulfill  his/her values, and improve the quality of his/her life. 3. Develop healthy interpersonal relationships that lead to the alleviation  and help prevent the relapse of depression. Objective Learn and implement problem-solving and decision-making skills. Target Date: 2024-10-15 Frequency: Biweekly Progress: 90 Modality: individual Related Interventions 1. Encourage in the client the development of a positive problem orientation in which problems  and solving them are viewed as a natural part of life and not something to be feared, despaired,  or avoided.  Objective Identify and replace thoughts and beliefs that support depression. Target Date: 2024-10-15 Frequency: Biweekly Progress: 90 Modality: individual Related Interventions 1. Explore and restructure underlying assumptions and beliefs reflected in  biased self-talk that  may put the client at risk for relapse or recurrence. 2. Facilitate and reinforce the client's shift from biased depressive self-talk and beliefs to realitybased cognitive messages that enhance self-confidence and increase adaptive actions (see  "Positive Self-Talk" in the Adult Psychotherapy Homework Planner by Beacher Bottoms). 4. Enhance ability to effectively cope with the full variety of life's worries  and anxieties. Objective Identify, challenge, and replace biased, fearful self-talk with positive, realistic, and empowering selftalk. Target Date: 2024-10-15 Frequency: Biweekly Progress: 80 Modality: individual Related Interventions 1. Explore the client's schema and self-talk that mediate his/her fear response; assist him/her in  challenging the biases; replace the distorted messages with reality-based alternatives and  positive, realistic self-talk that will increase his/her self-confidence in coping with irrational  fears (see Cognitive Therapy of Anxiety Disorders by Rohman Kaufman). Objective Learn and implement problem-solving strategies for realistically addressing worries. Target Date: 2024-10-15 Frequency: Biweekly Progress: 60 Modality: individual Therapy 5. Recognize, accept, and cope with feelings of depression. 6. Resolve conflicted feelings and adapt to the new life circumstances. 7. Stabilize anxiety level while increasing ability to function on a daily  The patient is making progress and has approved this plan of treatment.  Alejandro Gamel G Glynis Hunsucker, LCSW

## 2024-02-28 ENCOUNTER — Ambulatory Visit: Payer: Medicare Other | Admitting: Psychology

## 2024-03-13 ENCOUNTER — Ambulatory Visit (INDEPENDENT_AMBULATORY_CARE_PROVIDER_SITE_OTHER): Payer: Medicare Other | Admitting: Psychology

## 2024-03-13 DIAGNOSIS — F411 Generalized anxiety disorder: Secondary | ICD-10-CM

## 2024-03-13 DIAGNOSIS — F330 Major depressive disorder, recurrent, mild: Secondary | ICD-10-CM

## 2024-03-13 NOTE — Progress Notes (Signed)
 [facility] Counselor/Therapist Progress Note  Patient ID: Kristin Sparks, MRN: [id number],    Date: 03/13/2024  Time Spent:56 minutes  Time in:  9:03 Time out: 10:03  Treatment Type: Individual Therapy  Reported Symptoms: anxiety and depression  Mental Status Exam: Appearance:  Casual     Behavior: Appropriate  Motor: Normal  Speech/Language:  Normal Rate  Affect: Blunt  Mood: pleasant  Thought process: normal  Thought content:   WNL  Sensory/Perceptual disturbances:   WNL  Orientation: oriented to person, place, time/date, and situation  Attention: Good  Concentration: Good  Memory: WNL  Fund of knowledge:  Good  Insight:   Good  Judgment:  Good  Impulse Control: Good   Risk Assessment: Danger to Self:  No Self-injurious Behavior: No Danger to Others: No Duty to Warn:no Physical Aggression / Violence:No  Access to Firearms a concern: No  Gang Involvement:No   Subjective: The patient attended a face-to-face individual therapy session via video visit.  The patient gave verbal consent to have the session on video on caregility and the patient is aware of the limitations of telehealth. The patient was in her  home alone and the therapist was in the office.  The patient presents with a blunted affect and mood is pleasant.  The patient reports that she and her sister had a wonderful trip to [place].  She reports that they had good accommodations and the food was still issues and they had no problems on the trip whatsoever.  The patient is doing things now that she would not have done a few years ago.  I gave her positive feedback for making some arrangements to do something that pushed her out of her comfort zone.  The next thing that we will look at addressing will be moving to [place].  She does have a date to stay with in [place] and is planning to meet with a real estate agent and look around for potential place to move.  She has made a great deal of  progress in therapy and we will continue to work on helping her get through these obstacles that have been plaguing her for a long time.  Interventions: Cognitive Behavioral Therapy and Assertiveness/Communication, problem solving  Diagnosis:Mild episode of recurrent major depressive disorder (HCC)  Generalized anxiety disorder  Plan:  Client Abilities/Strengths  Intelligent, caring, motivated  Client Treatment Preferences  Outpatient individual therapy  Client Statement of Needs   I need some help to deal with my situation, I 'm not sure if I'm depressed or what?  Treatment Level  Outpatient Individual therapy  Symptoms  Frustration and anxiety related to providing oversight and caretaking to an aging, ailing, and dependent  parent.: No Description Entered (Status: improved). Lack of energy.: No Description Entered (Status:  improved). Motor tension (e.g., restlessness, tiredness, shakiness, muscle tension).: No Description  Entered (Status: improved). Poor concentration and indecisiveness.: No Description Entered (Status:  improved). Social withdrawal.: No Description Entered (Status: improved).  Problems Addressed  Unipolar Depression, Phase Of Life Problems, Unipolar Depression, Anxiety, Unipolar Depression,  Phase Of Life Problems, Anxiety  Goals 1. Appropriately grieve the loss in order to normalize mood and to return  to previously adaptive level of functioning. 2. Balance life activities between consideration of others and development of own interests. Objective Apply problem-solving skills to current circumstances Target Date: 2024-10-15 Frequency: Biweekly Progress: 90 Modality: individual Related Interventions 1. Use modeling and role-playing with the client to apply the problem-solving approach to  his/her  current circumstances (or assign Applying Problem-Solving to Interpersonal Conflict from  the Adult Psychotherapy Homework Planner by [facility]); encourage  implementation of action  plan, reinforcing success and redirecting for failure. Objective Implement new activities that increase a sense of satisfaction. Target Date: 2024-10-15 Frequency: Biweekly Progress: 90 Modality: individual Related Interventions 1. Develop a plan with the client to include activities that will increase his/her satisfaction, fulfill  his/her values, and improve the quality of his/her life. 3. Develop healthy interpersonal relationships that lead to the alleviation  and help prevent the relapse of depression. Objective Learn and implement problem-solving and decision-making skills. Target Date: 2024-10-15 Frequency: Biweekly Progress: 90 Modality: individual Related Interventions 1. Encourage in the client the development of a positive problem orientation in which problems  and solving them are viewed as a natural part of life and not something to be feared, despaired,  or avoided. 2. Conduct Problem-Solving Therapy  Implement new activities that increase a sense of satisfaction. Target Date: 2024-10-15 Frequency: Biweekly Progress: 90 Modality: individual Related Interventions 1. Develop a plan with the client to include activities that will increase his/her satisfaction, fulfill  his/her values, and improve the quality of his/her life. 3. Develop healthy interpersonal relationships that lead to the alleviation  and help prevent the relapse of depression. Objective Learn and implement problem-solving and decision-making skills. Target Date: 2024-10-15 Frequency: Biweekly Progress: 90 Modality: individual Related Interventions 1. Encourage in the client the development of a positive problem orientation in which problems  and solving them are viewed as a natural part of life and not something to be feared, despaired,  or avoided.  Objective Identify and replace thoughts and beliefs that support depression. Target Date: 2024-10-15 Frequency:  Biweekly Progress: 90 Modality: individual Related Interventions 1. Explore and restructure underlying assumptions and beliefs reflected in biased self-talk that  may put the client at risk for relapse or recurrence. 2. Facilitate and reinforce the client's shift from biased depressive self-talk and beliefs to realitybased cognitive messages that enhance self-confidence and increase adaptive actions (see  Positive Self-Talk in the Adult Psychotherapy Homework Planner by Beacher Bottoms). 4. Enhance ability to effectively cope with the full variety of life's worries  and anxieties. Objective Identify, challenge, and replace biased, fearful self-talk with positive, realistic, and empowering selftalk. Target Date: 2024-10-15 Frequency: Biweekly Progress: 80 Modality: individual Related Interventions 1. Explore the client's schema and self-talk that mediate his/her fear response; assist him/her in  challenging the biases; replace the distorted messages with reality-based alternatives and  positive, realistic self-talk that will increase his/her self-confidence in coping with irrational  fears (see Cognitive Therapy of Anxiety Disorders by Kreager Kaufman). Objective Learn and implement problem-solving strategies for realistically addressing worries. Target Date: 2024-10-15 Frequency: Biweekly Progress: 60 Modality: individual Therapy 5. Recognize, accept, and cope with feelings of depression. 6. Resolve conflicted feelings and adapt to the new life circumstances. 7. Stabilize anxiety level while increasing ability to function on a daily  The patient is making progress and has approved this plan of treatment.  Conroy Goracke G Sanjuanita Condrey, LCSW

## 2024-03-27 ENCOUNTER — Ambulatory Visit (INDEPENDENT_AMBULATORY_CARE_PROVIDER_SITE_OTHER): Payer: Medicare Other | Admitting: Psychology

## 2024-03-27 DIAGNOSIS — F411 Generalized anxiety disorder: Secondary | ICD-10-CM

## 2024-03-27 DIAGNOSIS — F330 Major depressive disorder, recurrent, mild: Secondary | ICD-10-CM | POA: Diagnosis not present

## 2024-03-27 NOTE — Progress Notes (Signed)
 [facility] Counselor/Therapist Progress Note  Patient ID: Kristin Sparks, MRN: [id number],    Date: 03/27/2024  Time Spent:59 minutes  Time in:  9:02 Time out: 10:01  Treatment Type: Individual Therapy  Reported Symptoms: anxiety and depression  Mental Status Exam: Appearance:  Casual     Behavior: Appropriate  Motor: Normal  Speech/Language:  Normal Rate  Affect: Blunt  Mood: pleasant  Thought process: normal  Thought content:   WNL  Sensory/Perceptual disturbances:   WNL  Orientation: oriented to person, place, time/date, and situation  Attention: Good  Concentration: Good  Memory: WNL  Fund of knowledge:  Good  Insight:   Good  Judgment:  Good  Impulse Control: Good   Risk Assessment: Danger to Self:  No Self-injurious Behavior: No Danger to Others: No Duty to Warn:no Physical Aggression / Violence:No  Access to Firearms a concern: No  Gang Involvement:No   Subjective: The patient attended a face-to-face individual therapy session via video visit.  The patient gave verbal consent to have the session on video on caregility and the patient is aware of the limitations of telehealth. The patient was in her  home alone and the therapist was in the office.  The patient presents with a blunted affect and mood is pleasant.  The patient seems to be doing well.  She states that she is ready to go somewhere else since she just got back from her trip.  She talked about going over her next week to stay with her son's dogs and that she would be looking for places that she could look at possibly to move.  We talked about different kinds of options for her and she does think it might be a better idea for her to look at 55+ communities.  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  The patient has made good progress since we started therapy. Interventions: Cognitive  Behavioral Therapy and Assertiveness/Communication, problem solving  Diagnosis:Mild episode of recurrent major depressive disorder (HCC)  Generalized anxiety disorder  Plan:  Client Abilities/Strengths  Intelligent, caring, motivated  Client Treatment Preferences  Outpatient individual therapy  Client Statement of Needs   I need some help to deal with my situation, I 'm not sure if I'm depressed or what?  Treatment Level  Outpatient Individual therapy  Symptoms  Frustration and anxiety related to providing oversight and caretaking to an aging, ailing, and dependent  parent.: No Description Entered (Status: improved). Lack of energy.: No Description Entered (Status:  improved). Motor tension (e.g., restlessness, tiredness, shakiness, muscle tension).: No Description  Entered (Status: improved). Poor concentration and indecisiveness.: No Description Entered (Status:  improved). Social withdrawal.: No Description Entered (Status: improved).  Problems Addressed  Unipolar Depression, Phase Of Life Problems, Unipolar Depression, Anxiety, Unipolar Depression,  Phase Of Life Problems, Anxiety  Goals 1. Appropriately grieve the loss in order to normalize mood and to return  to previously adaptive level of functioning. 2. Balance life activities between consideration of others and development of own interests. Objective Apply problem-solving skills to current circumstances Target Date: 2024-10-15 Frequency: Biweekly Progress: 90 Modality: individual Related Interventions 1. Use modeling and role-playing with the client to apply the problem-solving approach to his/her  current circumstances (or assign Applying Problem-Solving to Interpersonal Conflict from  the Adult Psychotherapy Homework Planner by Jenniffer); encourage implementation of action  plan, reinforcing success and redirecting  for failure. Objective Implement new activities that increase a sense of satisfaction. Target  Date: 2024-10-15 Frequency: Biweekly Progress: 90 Modality: individual Related Interventions 1. Develop a plan with the client to include activities that will increase his/her satisfaction, fulfill  his/her values, and improve the quality of his/her life. 3. Develop healthy interpersonal relationships that lead to the alleviation  and help prevent the relapse of depression. Objective Learn and implement problem-solving and decision-making skills. Target Date: 2024-10-15 Frequency: Biweekly Progress: 90 Modality: individual Related Interventions 1. Encourage in the client the development of a positive problem orientation in which problems  and solving them are viewed as a natural part of life and not something to be feared, despaired,  or avoided. 2. Conduct Problem-Solving Therapy  Implement new activities that increase a sense of satisfaction. Target Date: 2024-10-15 Frequency: Biweekly Progress: 90 Modality: individual Related Interventions 1. Develop a plan with the client to include activities that will increase his/her satisfaction, fulfill  his/her values, and improve the quality of his/her life. 3. Develop healthy interpersonal relationships that lead to the alleviation  and help prevent the relapse of depression. Objective Learn and implement problem-solving and decision-making skills. Target Date: 2024-10-15 Frequency: Biweekly Progress: 90 Modality: individual Related Interventions 1. Encourage in the client the development of a positive problem orientation in which problems  and solving them are viewed as a natural part of life and not something to be feared, despaired,  or avoided.  Objective Identify and replace thoughts and beliefs that support depression. Target Date: 2024-10-15 Frequency: Biweekly Progress: 90 Modality: individual Related Interventions 1. Explore and restructure underlying assumptions and beliefs reflected in biased self-talk that  may put the  client at risk for relapse or recurrence. 2. Facilitate and reinforce the client's shift from biased depressive self-talk and beliefs to realitybased cognitive messages that enhance self-confidence and increase adaptive actions (see  Positive Self-Talk in the Adult Psychotherapy Homework Planner by Jenniffer). 4. Enhance ability to effectively cope with the full variety of life's worries  and anxieties. Objective Identify, challenge, and replace biased, fearful self-talk with positive, realistic, and empowering selftalk. Target Date: 2024-10-15 Frequency: Biweekly Progress: 80 Modality: individual Related Interventions 1. Explore the client's schema and self-talk that mediate his/her fear response; assist him/her in  challenging the biases; replace the distorted messages with reality-based alternatives and  positive, realistic self-talk that will increase his/her self-confidence in coping with irrational  fears (see Cognitive Therapy of Anxiety Disorders by Gretta armin Mon). Objective Learn and implement problem-solving strategies for realistically addressing worries. Target Date: 2024-10-15 Frequency: Biweekly Progress: 60 Modality: individual Therapy 5. Recognize, accept, and cope with feelings of depression. 6. Resolve conflicted feelings and adapt to the new life circumstances. 7. Stabilize anxiety level while increasing ability to function on a daily  The patient is making progress and has approved this plan of treatment.  Patrick Sohm G Avea Mcgowen, LCSW

## 2024-04-10 ENCOUNTER — Ambulatory Visit (INDEPENDENT_AMBULATORY_CARE_PROVIDER_SITE_OTHER): Payer: Medicare Other | Admitting: Psychology

## 2024-04-10 DIAGNOSIS — F330 Major depressive disorder, recurrent, mild: Secondary | ICD-10-CM

## 2024-04-10 DIAGNOSIS — F411 Generalized anxiety disorder: Secondary | ICD-10-CM

## 2024-04-10 NOTE — Progress Notes (Unsigned)
 [facility] Counselor/Therapist Progress Note  Patient ID: Kristin Sparks, MRN: [id number],    Date: 04/10/2024  Time Spent:61 minutes  Time in:  9:03 Time out: 10:04  Treatment Type: Individual Therapy  Reported Symptoms: anxiety and depression  Mental Status Exam: Appearance:  Casual     Behavior: Appropriate  Motor: Normal  Speech/Language:  Normal Rate  Affect: Blunt  Mood: pleasant  Thought process: normal  Thought content:   WNL  Sensory/Perceptual disturbances:   WNL  Orientation: oriented to person, place, time/date, and situation  Attention: Good  Concentration: Good  Memory: WNL  Fund of knowledge:  Good  Insight:   Good  Judgment:  Good  Impulse Control: Good   Risk Assessment: Danger to Self:  No Self-injurious Behavior: No Danger to Others: No Duty to Warn:no Physical Aggression / Violence:No  Access to Firearms a concern: No  Gang Involvement:No   Subjective: The patient attended a face-to-face individual therapy session via video visit.  The patient gave verbal consent to have the session on video on caregility and the patient is aware of the limitations of telehealth. The patient was in her  home alone and the therapist was in the office.  The patient presents with a blunted affect and mood is pleasant.  The patient reports that she was over at her son's house in [place] last week and she is exhausted.  She had the responsibility of taking care of both of the dogs last week.  We talked about her need to set better limits and boundaries with her son about that because the patient is getting older and it makes it more difficult to take care of both dogs.  One is a Energy manager which is still a puppy and very huge.  The other is a lab mix.  While she was over there she did talk about going to look at some areas to potentially move eventually.  We processed that a little bit and she is just in the beginning stages of making those  decisions.  The patient has made some progress as far as traveling more and as far as considering moving in the near future.  Interventions: Cognitive Behavioral Therapy and Assertiveness/Communication, problem solving  Diagnosis:Mild episode of recurrent major depressive disorder (HCC)  Generalized anxiety disorder  Plan:  Client Abilities/Strengths  Intelligent, caring, motivated  Client Treatment Preferences  Outpatient individual therapy  Client Statement of Needs   I need some help to deal with my situation, I 'm not sure if I'm depressed or what?  Treatment Level  Outpatient Individual therapy  Symptoms  Frustration and anxiety related to providing oversight and caretaking to an aging, ailing, and dependent  parent.: No Description Entered (Status: improved). Lack of energy.: No Description Entered (Status:  improved). Motor tension (e.g., restlessness, tiredness, shakiness, muscle tension).: No Description  Entered (Status: improved). Poor concentration and indecisiveness.: No Description Entered (Status:  improved). Social withdrawal.: No Description Entered (Status: improved).  Problems Addressed  Unipolar Depression, Phase Of Life Problems, Unipolar Depression, Anxiety, Unipolar Depression,  Phase Of Life Problems, Anxiety  Goals 1. Appropriately grieve the loss in order to normalize mood and to return  to previously adaptive level of functioning. 2. Balance life activities between consideration of others and development of own interests. Objective Apply problem-solving skills to current circumstances Target Date: 2024-10-15 Frequency: Biweekly Progress: 90 Modality: individual Related Interventions 1. Use modeling and role-playing with the client to apply the problem-solving approach to his/her  current circumstances (or assign Applying Problem-Solving to Interpersonal Conflict from  the Adult Psychotherapy Homework Planner by [facility]); encourage implementation  of action  plan, reinforcing success and redirecting for failure. Objective Implement new activities that increase a sense of satisfaction. Target Date: 2024-10-15 Frequency: Biweekly Progress: 90 Modality: individual Related Interventions 1. Develop a plan with the client to include activities that will increase his/her satisfaction, fulfill  his/her values, and improve the quality of his/her life. 3. Develop healthy interpersonal relationships that lead to the alleviation  and help prevent the relapse of depression. Objective Learn and implement problem-solving and decision-making skills. Target Date: 2024-10-15 Frequency: Biweekly Progress: 90 Modality: individual Related Interventions 1. Encourage in the client the development of a positive problem orientation in which problems  and solving them are viewed as a natural part of life and not something to be feared, despaired,  or avoided. 2. Conduct Problem-Solving Therapy  Implement new activities that increase a sense of satisfaction. Target Date: 2024-10-15 Frequency: Biweekly Progress: 90 Modality: individual Related Interventions 1. Develop a plan with the client to include activities that will increase his/her satisfaction, fulfill  his/her values, and improve the quality of his/her life. 3. Develop healthy interpersonal relationships that lead to the alleviation  and help prevent the relapse of depression. Objective Learn and implement problem-solving and decision-making skills. Target Date: 2024-10-15 Frequency: Biweekly Progress: 90 Modality: individual Related Interventions 1. Encourage in the client the development of a positive problem orientation in which problems  and solving them are viewed as a natural part of life and not something to be feared, despaired,  or avoided.  Objective Identify and replace thoughts and beliefs that support depression. Target Date: 2024-10-15 Frequency: Biweekly Progress: 90  Modality: individual Related Interventions 1. Explore and restructure underlying assumptions and beliefs reflected in biased self-talk that  may put the client at risk for relapse or recurrence. 2. Facilitate and reinforce the client's shift from biased depressive self-talk and beliefs to realitybased cognitive messages that enhance self-confidence and increase adaptive actions (see  Positive Self-Talk in the Adult Psychotherapy Homework Planner by Jenniffer). 4. Enhance ability to effectively cope with the full variety of life's worries  and anxieties. Objective Identify, challenge, and replace biased, fearful self-talk with positive, realistic, and empowering selftalk. Target Date: 2024-10-15 Frequency: Biweekly Progress: 80 Modality: individual Related Interventions 1. Explore the client's schema and self-talk that mediate his/her fear response; assist him/her in  challenging the biases; replace the distorted messages with reality-based alternatives and  positive, realistic self-talk that will increase his/her self-confidence in coping with irrational  fears (see Cognitive Therapy of Anxiety Disorders by Gretta armin Mon). Objective Learn and implement problem-solving strategies for realistically addressing worries. Target Date: 2024-10-15 Frequency: Biweekly Progress: 60 Modality: individual Therapy 5. Recognize, accept, and cope with feelings of depression. 6. Resolve conflicted feelings and adapt to the new life circumstances. 7. Stabilize anxiety level while increasing ability to function on a daily  The patient is making progress and has approved this plan of treatment.  Ivone Licht G Damione Robideau, LCSW

## 2024-04-24 ENCOUNTER — Ambulatory Visit: Payer: Medicare Other | Admitting: Psychology

## 2024-05-08 ENCOUNTER — Ambulatory Visit (INDEPENDENT_AMBULATORY_CARE_PROVIDER_SITE_OTHER): Admitting: Psychology

## 2024-05-08 DIAGNOSIS — F411 Generalized anxiety disorder: Secondary | ICD-10-CM | POA: Diagnosis not present

## 2024-05-08 DIAGNOSIS — F330 Major depressive disorder, recurrent, mild: Secondary | ICD-10-CM

## 2024-05-08 NOTE — Progress Notes (Signed)
 [facility] Counselor/Therapist Progress Note  Patient ID: Kristin Sparks, MRN: [id number],    Date: 05/08/2024  Time Spent:59 minutes  Time in:  9:04 Time out: 10:03  Treatment Type: Individual Therapy  Reported Symptoms: anxiety and depression  Mental Status Exam: Appearance:  Casual     Behavior: Appropriate  Motor: Normal  Speech/Language:  Normal Rate  Affect: Blunt  Mood: pleasant  Thought process: normal  Thought content:   WNL  Sensory/Perceptual disturbances:   WNL  Orientation: oriented to person, place, time/date, and situation  Attention: Good  Concentration: Good  Memory: WNL  Fund of knowledge:  Good  Insight:   Good  Judgment:  Good  Impulse Control: Good   Risk Assessment: Danger to Self:  No Self-injurious Behavior: No Danger to Others: No Duty to Warn:no Physical Aggression / Violence:No  Access to Firearms a concern: No  Gang Involvement:No   Subjective: The patient attended a face-to-face individual therapy session via video visit.  The patient gave verbal consent to have the session on video on caregility and the patient is aware of the limitations of telehealth. The patient was in her  home alone and the therapist was in the office.  The patient presents with a blunted affect and mood is pleasant.  The patient reports that she had a good time at the beach with her sister.  She reports that she has been cleaning out again at home.  In addition the patient has been looking on [employer] and has been finding some condos that she might be interested in looking at.  I do believe that she is making progress in making some strides to help herself go ahead and get settled wherever she is going to end up.  She still talks negatively and talked about getting more stressed because fall is coming and she tends to have seasonal affective disorder.  She did report that her Wellbutrin  was increased.  We talked about her going ahead and making a move to look  at the place in [place] that she is interested in looking at.  She also reported that she did go to get a second opinion from the doctor I recommended and she feels good about that.  She may end up getting a knee replacement but she still has some angst about doing that. Interventions: Cognitive Behavioral Therapy and Assertiveness/Communication, problem solving  Diagnosis:Mild episode of recurrent major depressive disorder (HCC)  Generalized anxiety disorder  Plan:  Client Abilities/Strengths  Intelligent, caring, motivated  Client Treatment Preferences  Outpatient individual therapy  Client Statement of Needs   I need some help to deal with my situation, I 'm not sure if I'm depressed or what?  Treatment Level  Outpatient Individual therapy  Symptoms  Frustration and anxiety related to providing oversight and caretaking to an aging, ailing, and dependent  parent.: No Description Entered (Status: improved). Lack of energy.: No Description Entered (Status:  improved). Motor tension (e.g., restlessness, tiredness, shakiness, muscle tension).: No Description  Entered (Status: improved). Poor concentration and indecisiveness.: No Description Entered (Status:  improved). Social withdrawal.: No Description Entered (Status: improved).  Problems Addressed  Unipolar Depression, Phase Of Life Problems, Unipolar Depression, Anxiety, Unipolar Depression,  Phase Of Life Problems, Anxiety  Goals 1. Appropriately grieve the loss in order to normalize mood and to return  to previously adaptive level of functioning. 2. Balance life activities between consideration of others and development of own interests. Objective Apply problem-solving skills to current circumstances Target Date:  2024-10-15 Frequency: Biweekly Progress: 90 Modality: individual Related Interventions 1. Use modeling and role-playing with the client to apply the problem-solving approach to his/her  current  circumstances (or assign Applying Problem-Solving to Interpersonal Conflict from  the Adult Psychotherapy Homework Planner by Jenniffer); encourage implementation of action  plan, reinforcing success and redirecting for failure. Objective Implement new activities that increase a sense of satisfaction. Target Date: 2024-10-15 Frequency: Biweekly Progress: 90 Modality: individual Related Interventions 1. Develop a plan with the client to include activities that will increase his/her satisfaction, fulfill  his/her values, and improve the quality of his/her life. 3. Develop healthy interpersonal relationships that lead to the alleviation  and help prevent the relapse of depression. Objective Learn and implement problem-solving and decision-making skills. Target Date: 2024-10-15 Frequency: Biweekly Progress: 90 Modality: individual Related Interventions 1. Encourage in the client the development of a positive problem orientation in which problems  and solving them are viewed as a natural part of life and not something to be feared, despaired,  or avoided. 2. Conduct Problem-Solving Therapy  Implement new activities that increase a sense of satisfaction. Target Date: 2024-10-15 Frequency: Biweekly Progress: 90 Modality: individual Related Interventions 1. Develop a plan with the client to include activities that will increase his/her satisfaction, fulfill  his/her values, and improve the quality of his/her life. 3. Develop healthy interpersonal relationships that lead to the alleviation  and help prevent the relapse of depression. Objective Learn and implement problem-solving and decision-making skills. Target Date: 2024-10-15 Frequency: Biweekly Progress: 90 Modality: individual Related Interventions 1. Encourage in the client the development of a positive problem orientation in which problems  and solving them are viewed as a natural part of life and not something to be feared,  despaired,  or avoided.  Objective Identify and replace thoughts and beliefs that support depression. Target Date: 2024-10-15 Frequency: Biweekly Progress: 90 Modality: individual Related Interventions 1. Explore and restructure underlying assumptions and beliefs reflected in biased self-talk that  may put the client at risk for relapse or recurrence. 2. Facilitate and reinforce the client's shift from biased depressive self-talk and beliefs to realitybased cognitive messages that enhance self-confidence and increase adaptive actions (see  Positive Self-Talk in the Adult Psychotherapy Homework Planner by Jenniffer). 4. Enhance ability to effectively cope with the full variety of life's worries  and anxieties. Objective Identify, challenge, and replace biased, fearful self-talk with positive, realistic, and empowering selftalk. Target Date: 2024-10-15 Frequency: Biweekly Progress: 80 Modality: individual Related Interventions 1. Explore the client's schema and self-talk that mediate his/her fear response; assist him/her in  challenging the biases; replace the distorted messages with reality-based alternatives and  positive, realistic self-talk that will increase his/her self-confidence in coping with irrational  fears (see Cognitive Therapy of Anxiety Disorders by Gretta armin Mon). Objective Learn and implement problem-solving strategies for realistically addressing worries. Target Date: 2024-10-15 Frequency: Biweekly Progress: 60 Modality: individual Therapy 5. Recognize, accept, and cope with feelings of depression. 6. Resolve conflicted feelings and adapt to the new life circumstances. 7. Stabilize anxiety level while increasing ability to function on a daily  The patient is making progress and has approved this plan of treatment.  Khamron Gellert G Acelin Ferdig,  LCSW

## 2024-05-22 ENCOUNTER — Ambulatory Visit (INDEPENDENT_AMBULATORY_CARE_PROVIDER_SITE_OTHER): Admitting: Psychology

## 2024-05-22 DIAGNOSIS — F411 Generalized anxiety disorder: Secondary | ICD-10-CM | POA: Diagnosis not present

## 2024-05-22 DIAGNOSIS — F330 Major depressive disorder, recurrent, mild: Secondary | ICD-10-CM | POA: Diagnosis not present

## 2024-05-22 NOTE — Progress Notes (Signed)
 [facility] Counselor/Therapist Progress Note  Patient ID: Kristin Sparks, MRN: [id number],    Date: 05/22/2024  Time Spent:59 minutes  Time in:  9:02 Time out: 10:01  Treatment Type: Individual Therapy  Reported Symptoms: anxiety and depression  Mental Status Exam: Appearance:  Casual     Behavior: Appropriate  Motor: Normal  Speech/Language:  Normal Rate  Affect: Blunt  Mood: pleasant  Thought process: normal  Thought content:   WNL  Sensory/Perceptual disturbances:   WNL  Orientation: oriented to person, place, time/date, and situation  Attention: Good  Concentration: Good  Memory: WNL  Fund of knowledge:  Good  Insight:   Good  Judgment:  Good  Impulse Control: Good   Risk Assessment: Danger to Self:  No Self-injurious Behavior: No Danger to Others: No Duty to Warn:no Physical Aggression / Violence:No  Access to Firearms a concern: No  Gang Involvement:No   Subjective: The patient attended a face-to-face individual therapy session via video visit.  The patient gave verbal consent to have the session on video on caregility and the patient is aware of the limitations of telehealth. The patient was in her  home alone and the therapist was in the office.  The patient presents with a blunted affect and mood is pleasant.  Patient reports that she has been doing some cleaning out at home and feels good about this.  She has been looking at some condos in [place] but has not found anything yet that she feels the need to purchase.  She also reports that she scheduled her knee surgery on October 7.  The patient is making some good progress and getting some things done and seems to be doing well.  She reports that she still would like to be able to travel and is a little bit anxious about the surgery but is coming up and we talked about her doing her research and talking to the doctor to make sure that this is a good decision for her.  Interventions: Cognitive  Behavioral Therapy and Assertiveness/Communication, problem solving  Diagnosis:Mild episode of recurrent major depressive disorder (HCC)  Generalized anxiety disorder  Plan:  Client Abilities/Strengths  Intelligent, caring, motivated  Client Treatment Preferences  Outpatient individual therapy  Client Statement of Needs   I need some help to deal with my situation, I 'm not sure if I'm depressed or what?  Treatment Level  Outpatient Individual therapy  Symptoms  Frustration and anxiety related to providing oversight and caretaking to an aging, ailing, and dependent  parent.: No Description Entered (Status: improved). Lack of energy.: No Description Entered (Status:  improved). Motor tension (e.g., restlessness, tiredness, shakiness, muscle tension).: No Description  Entered (Status: improved). Poor concentration and indecisiveness.: No Description Entered (Status:  improved). Social withdrawal.: No Description Entered (Status: improved).  Problems Addressed  Unipolar Depression, Phase Of Life Problems, Unipolar Depression, Anxiety, Unipolar Depression,  Phase Of Life Problems, Anxiety  Goals 1. Appropriately grieve the loss in order to normalize mood and to return  to previously adaptive level of functioning. 2. Balance life activities between consideration of others and development of own interests. Objective Apply problem-solving skills to current circumstances Target Date: 2024-10-15 Frequency: Biweekly Progress: 90 Modality: individual Related Interventions 1. Use modeling and role-playing with the client to apply the problem-solving approach to his/her  current circumstances (or assign Applying Problem-Solving to Interpersonal Conflict from  the Adult Psychotherapy Homework Planner by Jenniffer); encourage implementation of action  plan, reinforcing success and redirecting for failure.  Objective Implement new activities that increase a sense of satisfaction. Target  Date: 2024-10-15 Frequency: Biweekly Progress: 90 Modality: individual Related Interventions 1. Develop a plan with the client to include activities that will increase his/her satisfaction, fulfill  his/her values, and improve the quality of his/her life. 3. Develop healthy interpersonal relationships that lead to the alleviation  and help prevent the relapse of depression. Objective Learn and implement problem-solving and decision-making skills. Target Date: 2024-10-15 Frequency: Biweekly Progress: 90 Modality: individual Related Interventions 1. Encourage in the client the development of a positive problem orientation in which problems  and solving them are viewed as a natural part of life and not something to be feared, despaired,  or avoided. 2. Conduct Problem-Solving Therapy  Implement new activities that increase a sense of satisfaction. Target Date: 2024-10-15 Frequency: Biweekly Progress: 90 Modality: individual Related Interventions 1. Develop a plan with the client to include activities that will increase his/her satisfaction, fulfill  his/her values, and improve the quality of his/her life. 3. Develop healthy interpersonal relationships that lead to the alleviation  and help prevent the relapse of depression. Objective Learn and implement problem-solving and decision-making skills. Target Date: 2024-10-15 Frequency: Biweekly Progress: 90 Modality: individual Related Interventions 1. Encourage in the client the development of a positive problem orientation in which problems  and solving them are viewed as a natural part of life and not something to be feared, despaired,  or avoided.  Objective Identify and replace thoughts and beliefs that support depression. Target Date: 2024-10-15 Frequency: Biweekly Progress: 90 Modality: individual Related Interventions 1. Explore and restructure underlying assumptions and beliefs reflected in biased self-talk that  may put the  client at risk for relapse or recurrence. 2. Facilitate and reinforce the client's shift from biased depressive self-talk and beliefs to realitybased cognitive messages that enhance self-confidence and increase adaptive actions (see  Positive Self-Talk in the Adult Psychotherapy Homework Planner by Jenniffer). 4. Enhance ability to effectively cope with the full variety of life's worries  and anxieties. Objective Identify, challenge, and replace biased, fearful self-talk with positive, realistic, and empowering selftalk. Target Date: 2024-10-15 Frequency: Biweekly Progress: 80 Modality: individual Related Interventions 1. Explore the client's schema and self-talk that mediate his/her fear response; assist him/her in  challenging the biases; replace the distorted messages with reality-based alternatives and  positive, realistic self-talk that will increase his/her self-confidence in coping with irrational  fears (see Cognitive Therapy of Anxiety Disorders by Gretta armin Mon). Objective Learn and implement problem-solving strategies for realistically addressing worries. Target Date: 2024-10-15 Frequency: Biweekly Progress: 60 Modality: individual Therapy 5. Recognize, accept, and cope with feelings of depression. 6. Resolve conflicted feelings and adapt to the new life circumstances. 7. Stabilize anxiety level while increasing ability to function on a daily  The patient is making progress and has approved this plan of treatment.  Seher Schlagel G Kierre Deines, LCSW

## 2024-06-05 ENCOUNTER — Ambulatory Visit: Admitting: Psychology

## 2024-06-19 ENCOUNTER — Ambulatory Visit (INDEPENDENT_AMBULATORY_CARE_PROVIDER_SITE_OTHER): Admitting: Psychology

## 2024-06-19 DIAGNOSIS — F411 Generalized anxiety disorder: Secondary | ICD-10-CM | POA: Diagnosis not present

## 2024-06-19 DIAGNOSIS — F330 Major depressive disorder, recurrent, mild: Secondary | ICD-10-CM

## 2024-06-19 NOTE — Progress Notes (Unsigned)
                edge,  experiencing concentration difficulties, having trouble falling or staying asleep, exhibiting a general  state of irritability).: No Description Entered (Status: improved). Motor tension (e.g., restlessness,  tiredness, shakiness, muscle tension).: No Description Entered (Status: improved).  Problems Addressed  Anxiety, Phase Of Life Problems, Anxiety  Goals 1. Learn and implement coping skills that result in a reduction of anxiety  and worry, and improved daily functioning. Objective Learn  and implement calming skills to reduce overall anxiety and manage anxiety symptoms. Target Date: 2025-08-09Frequency: Weekly Progress: 40 Modality: individual  Related Interventions 1. Teach the client calming/relaxation skills (e.g., applied relaxation, progressive muscle  relaxation, cue controlled relaxation; mindful breathing; biofeedback) and how to discriminate  better between relaxation and tension; teach the client how to apply these skills to his/her daily  life (e.g., New Directions in Progressive Muscle Relaxation by Marcelyn Ditty, and  Hazlett-Stevens; Treating Generalized Anxiety Disorder by Rygh and Ida Rogue). Objective Identify, challenge, and replace biased, fearful self-talk with positive, realistic, and empowering selftalk. Target Date: 2024-05-05 Frequency: weekly Progress: 30 Modality: individual Related Interventions 1. Explore the client's schema and self-talk that mediate his/her fear response; assist him/her in  challenging the biases; replace the distorted messages with reality-based alternatives and  positive, realistic self-talk that will increase his/her self-confidence in coping with irrational  fears (see Cognitive Therapy of Anxiety Disorders by Laurence Slate). Objective Learn and implement problem-solving strategies for realistically addressing worries. Target Date: 2025-08-09Frequency: weekly Progress: 40 Modality: individual 2. Resolve conflicted feelings and adapt to the new life circumstances. Objective Apply problem-solving skills to current circumstances. Target Date: 2024-05-05 Frequency: weekly Progress: 20 Modality: individual Related Interventions 1. Teach the client problem-resolution skills (e.g., defining the problem clearly, brainstorming  multiple solutions, listing the pros and cons of each solution, seeking input from others,  selecting and implementing a plan of action, evaluating outcome, and readjusting plan as   necessary).   3. Stabilize anxiety level while increasing ability to function on a daily  basis. 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  The patient approved this plan.   Deonna Krummel G Ethridge Sollenberger, LCSW

## 2024-07-03 ENCOUNTER — Ambulatory Visit: Admitting: Psychology

## 2024-07-17 ENCOUNTER — Ambulatory Visit: Admitting: Psychology

## 2024-07-31 ENCOUNTER — Ambulatory Visit: Admitting: Psychology

## 2024-07-31 DIAGNOSIS — F330 Major depressive disorder, recurrent, mild: Secondary | ICD-10-CM | POA: Diagnosis not present

## 2024-07-31 DIAGNOSIS — F411 Generalized anxiety disorder: Secondary | ICD-10-CM | POA: Diagnosis not present

## 2024-07-31 NOTE — Progress Notes (Signed)
 [facility] Counselor/Therapist Progress Note  Patient ID: Kristin Sparks, MRN: [id number],    Date: 07/31/2024  Time Spent:75minutes  Time in:  9:03 Time out: 10:06  Treatment Type: Individual Therapy  Reported Symptoms: anxiety and depression  Mental Status Exam: Appearance:  Casual     Behavior: Appropriate  Motor: Normal  Speech/Language:  Normal Rate  Affect: Blunt  Mood: pleasant  Thought process: normal  Thought content:   WNL  Sensory/Perceptual disturbances:   WNL  Orientation: oriented to person, place, time/date, and situation  Attention: Good  Concentration: Good  Memory: WNL  Fund of knowledge:  Good  Insight:   Good  Judgment:  Good  Impulse Control: Good   Risk Assessment: Danger to Self:  No Self-injurious Behavior: No Danger to Others: No Duty to Warn:no Physical Aggression / Violence:No  Access to Firearms a concern: No  Gang Involvement:No   Subjective: The patient attended a face-to-face individual therapy session via video visit.  The patient gave verbal consent to have the session on video on caregility and the patient is aware of the limitations of telehealth. The patient was in her  home alone and the therapist was in the office.  The patient presents with a blunted affect and mood is pleasant.   The patient had her knee surgery and its event about 3 weeks.  She reports that she is going to physical therapy and that seems to be going well.  The patient did talk about feeling like she is getting older and talked about struggling with her mood around that.  We talked about doing some reframing of her thoughts and that in some ways she is doing what if thinking and advance of her aging process.  I gave her an example of what I was talking about and she does seem to be moving in the direction of thinking she is old and does not want to be perceived that way.  I explained to her that she probably needs to lean more into it and think of it in  terms of being more able to do what it is that she wants to do and having a new knee is a good thing because that will give her some more time to get things done.  The patient was able to recognize that she is being negative about aging and that she needs to work on changing her cognitions about that. Interventions: Cognitive Behavioral Therapy and Assertiveness/Communication, problem solving  Diagnosis:Mild episode of recurrent major depressive disorder  Generalized anxiety disorder  Plan:  Client Abilities/Strengths  Intelligent, caring, motivated  Client Treatment Preferences  Outpatient individual therapy  Client Statement of Needs   I need some help to deal with my situation, I 'm not sure if I'm depressed or what?  Treatment Level  Outpatient Individual therapy  Symptoms  Frustration and anxiety related to providing oversight and caretaking to an aging, ailing, and dependent  parent.: No Description Entered (Status: improved). Lack of energy.: No Description Entered (Status:  improved). Motor tension (e.g., restlessness, tiredness, shakiness, muscle tension).: No Description  Entered (Status: improved). Poor concentration and indecisiveness.: No Description Entered (Status:  improved). Social withdrawal.: No Description Entered (Status: improved).  Problems Addressed  Unipolar Depression, Phase Of Life Problems, Unipolar Depression, Anxiety, Unipolar Depression,  Phase Of Life Problems, Anxiety  Goals 1. Appropriately grieve the loss in order to normalize mood and to return  to previously adaptive level of functioning. 2. Balance life activities between consideration of  others and development of own interests. Objective Apply problem-solving skills to current circumstances Target Date: 2024-10-15 Frequency: Biweekly Progress: 90 Modality: individual Related Interventions 1. Use modeling and role-playing with the client to apply the problem-solving approach to his/her   current circumstances (or assign Applying Problem-Solving to Interpersonal Conflict from  the Adult Psychotherapy Homework Planner by Jenniffer); encourage implementation of action  plan, reinforcing success and redirecting for failure. Objective Implement new activities that increase a sense of satisfaction. Target Date: 2024-10-15 Frequency: Biweekly Progress: 90 Modality: individual Related Interventions 1. Develop a plan with the client to include activities that will increase his/her satisfaction, fulfill  his/her values, and improve the quality of his/her life. 3. Develop healthy interpersonal relationships that lead to the alleviation  and help prevent the relapse of depression. Objective Learn and implement problem-solving and decision-making skills. Target Date: 2024-10-15 Frequency: Biweekly Progress: 90 Modality: individual Related Interventions 1. Encourage in the client the development of a positive problem orientation in which problems  and solving them are viewed as a natural part of life and not something to be feared, despaired,  or avoided. 2. Conduct Problem-Solving Therapy  Implement new activities that increase a sense of satisfaction. Target Date: 2024-10-15 Frequency: Biweekly Progress: 90 Modality: individual Related Interventions 1. Develop a plan with the client to include activities that will increase his/her satisfaction, fulfill  his/her values, and improve the quality of his/her life. 3. Develop healthy interpersonal relationships that lead to the alleviation  and help prevent the relapse of depression. Objective Learn and implement problem-solving and decision-making skills. Target Date: 2024-10-15 Frequency: Biweekly Progress: 90 Modality: individual Related Interventions 1. Encourage in the client the development of a positive problem orientation in which problems  and solving them are viewed as a natural part of life and not something to be  feared, despaired,  or avoided.  Objective Identify and replace thoughts and beliefs that support depression. Target Date: 2024-10-15 Frequency: Biweekly Progress: 90 Modality: individual Related Interventions 1. Explore and restructure underlying assumptions and beliefs reflected in biased self-talk that  may put the client at risk for relapse or recurrence. 2. Facilitate and reinforce the client's shift from biased depressive self-talk and beliefs to realitybased cognitive messages that enhance self-confidence and increase adaptive actions (see  Positive Self-Talk in the Adult Psychotherapy Homework Planner by Jenniffer). 4. Enhance ability to effectively cope with the full variety of life's worries  and anxieties. Objective Identify, challenge, and replace biased, fearful self-talk with positive, realistic, and empowering selftalk. Target Date: 2024-10-15 Frequency: Biweekly Progress: 80 Modality: individual Related Interventions 1. Explore the client's schema and self-talk that mediate his/her fear response; assist him/her in  challenging the biases; replace the distorted messages with reality-based alternatives and  positive, realistic self-talk that will increase his/her self-confidence in coping with irrational  fears (see Cognitive Therapy of Anxiety Disorders by Gretta armin Mon). Objective Learn and implement problem-solving strategies for realistically addressing worries. Target Date: 2024-10-15 Frequency: Biweekly Progress: 60 Modality: individual Therapy 5. Recognize, accept, and cope with feelings of depression. 6. Resolve conflicted feelings and adapt to the new life circumstances. 7. Stabilize anxiety level while increasing ability to function on a daily  The patient is making progress and has approved this plan of treatment.  Maniyah Moller G Kaylana Fenstermacher,  LCSW

## 2024-08-14 ENCOUNTER — Ambulatory Visit (INDEPENDENT_AMBULATORY_CARE_PROVIDER_SITE_OTHER): Admitting: Psychology

## 2024-08-14 DIAGNOSIS — F330 Major depressive disorder, recurrent, mild: Secondary | ICD-10-CM

## 2024-08-14 DIAGNOSIS — F411 Generalized anxiety disorder: Secondary | ICD-10-CM

## 2024-08-14 NOTE — Progress Notes (Signed)
 [facility] Counselor/Therapist Progress Note  Patient ID: Kristin Sparks, MRN: [id number],    Date: 08/14/2024  Time Spent:12minutes  Time in:  9:04 Time out: 10:01  Treatment Type: Individual Therapy  Reported Symptoms: anxiety and depression  Mental Status Exam: Appearance:  Casual     Behavior: Appropriate  Motor: Normal  Speech/Language:  Normal Rate  Affect: Blunt  Mood: pleasant  Thought process: normal  Thought content:   WNL  Sensory/Perceptual disturbances:   WNL  Orientation: oriented to person, place, time/date, and situation  Attention: Good  Concentration: Good  Memory: WNL  Fund of knowledge:  Good  Insight:   Good  Judgment:  Good  Impulse Control: Good   Risk Assessment: Danger to Self:  No Self-injurious Behavior: No Danger to Others: No Duty to Warn:no Physical Aggression / Violence:No  Access to Firearms a concern: No  Gang Involvement:No   Subjective: The patient attended a face-to-face individual therapy session via video visit.  The patient gave verbal consent to have the session on video on caregility and the patient is aware of the limitations of telehealth. The patient was in her  home alone and the therapist was in the office.  The patient presents with a blunted affect and mood is pleasant.  The patient continues to report that she is going to PT and working on getting better.  She states that it still difficult for her to be able manage.  She reports that she overdid it in the last 2 weeks.  We talked about her focusing on getting well and then we also talked about her being able to look for a place in [place] after she gets better.  I continued to encourage her to set limits with her son.  He seems to want her to take [facility] back and I reported to her that that probably would not be a good idea since she just had surgery.  I am going to continue to encourage her to move forward with trying to have her plan together before I retire.   I do believe that she will graduate as I do not think she wants to reengage with another provider.  I will talk to her a little more about this.  Interventions: Cognitive Behavioral Therapy and Assertiveness/Communication, problem solving  Diagnosis:No diagnosis found.  Plan:  Client Abilities/Strengths  Intelligent, caring, motivated  Client Treatment Preferences  Outpatient individual therapy  Client Statement of Needs   I need some help to deal with my situation, I 'm not sure if I'm depressed or what?  Treatment Level  Outpatient Individual therapy  Symptoms  Frustration and anxiety related to providing oversight and caretaking to an aging, ailing, and dependent  parent.: No Description Entered (Status: improved). Lack of energy.: No Description Entered (Status:  improved). Motor tension (e.g., restlessness, tiredness, shakiness, muscle tension).: No Description  Entered (Status: improved). Poor concentration and indecisiveness.: No Description Entered (Status:  improved). Social withdrawal.: No Description Entered (Status: improved).  Problems Addressed  Unipolar Depression, Phase Of Life Problems, Unipolar Depression, Anxiety, Unipolar Depression,  Phase Of Life Problems, Anxiety  Goals 1. Appropriately grieve the loss in order to normalize mood and to return  to previously adaptive level of functioning. 2. Balance life activities between consideration of others and development of own interests. Objective Apply problem-solving skills to current circumstances Target Date: 2024-10-15 Frequency: Biweekly Progress: 90 Modality: individual Related Interventions 1. Use modeling and role-playing with the client to apply the problem-solving approach to  his/her  current circumstances (or assign Applying Problem-Solving to Interpersonal Conflict from  the Adult Psychotherapy Homework Planner by [facility]); encourage implementation of action  plan, reinforcing success and  redirecting for failure. Objective Implement new activities that increase a sense of satisfaction. Target Date: 2024-10-15 Frequency: Biweekly Progress: 90 Modality: individual Related Interventions 1. Develop a plan with the client to include activities that will increase his/her satisfaction, fulfill  his/her values, and improve the quality of his/her life. 3. Develop healthy interpersonal relationships that lead to the alleviation  and help prevent the relapse of depression. Objective Learn and implement problem-solving and decision-making skills. Target Date: 2024-10-15 Frequency: Biweekly Progress: 90 Modality: individual Related Interventions 1. Encourage in the client the development of a positive problem orientation in which problems  and solving them are viewed as a natural part of life and not something to be feared, despaired,  or avoided. 2. Conduct Problem-Solving Therapy  Implement new activities that increase a sense of satisfaction. Target Date: 2024-10-15 Frequency: Biweekly Progress: 90 Modality: individual Related Interventions 1. Develop a plan with the client to include activities that will increase his/her satisfaction, fulfill  his/her values, and improve the quality of his/her life. 3. Develop healthy interpersonal relationships that lead to the alleviation  and help prevent the relapse of depression. Objective Learn and implement problem-solving and decision-making skills. Target Date: 2024-10-15 Frequency: Biweekly Progress: 90 Modality: individual Related Interventions 1. Encourage in the client the development of a positive problem orientation in which problems  and solving them are viewed as a natural part of life and not something to be feared, despaired,  or avoided.  Objective Identify and replace thoughts and beliefs that support depression. Target Date: 2024-10-15 Frequency: Biweekly Progress: 90 Modality: individual Related Interventions 1.  Explore and restructure underlying assumptions and beliefs reflected in biased self-talk that  may put the client at risk for relapse or recurrence. 2. Facilitate and reinforce the client's shift from biased depressive self-talk and beliefs to realitybased cognitive messages that enhance self-confidence and increase adaptive actions (see  Positive Self-Talk in the Adult Psychotherapy Homework Planner by Jenniffer). 4. Enhance ability to effectively cope with the full variety of life's worries  and anxieties. Objective Identify, challenge, and replace biased, fearful self-talk with positive, realistic, and empowering selftalk. Target Date: 2024-10-15 Frequency: Biweekly Progress: 80 Modality: individual Related Interventions 1. Explore the client's schema and self-talk that mediate his/her fear response; assist him/her in  challenging the biases; replace the distorted messages with reality-based alternatives and  positive, realistic self-talk that will increase his/her self-confidence in coping with irrational  fears (see Cognitive Therapy of Anxiety Disorders by Gretta armin Mon). Objective Learn and implement problem-solving strategies for realistically addressing worries. Target Date: 2024-10-15 Frequency: Biweekly Progress: 60 Modality: individual Therapy 5. Recognize, accept, and cope with feelings of depression. 6. Resolve conflicted feelings and adapt to the new life circumstances. 7. Stabilize anxiety level while increasing ability to function on a daily  The patient is making progress and has approved this plan of treatment.  Josede Cicero G Quantasia Stegner, LCSW

## 2024-08-28 ENCOUNTER — Ambulatory Visit: Admitting: Psychology

## 2024-09-11 ENCOUNTER — Ambulatory Visit: Admitting: Psychology

## 2024-09-11 DIAGNOSIS — F411 Generalized anxiety disorder: Secondary | ICD-10-CM | POA: Diagnosis not present

## 2024-09-11 DIAGNOSIS — F330 Major depressive disorder, recurrent, mild: Secondary | ICD-10-CM

## 2024-09-11 NOTE — Progress Notes (Unsigned)
                edge,  experiencing concentration difficulties, having trouble falling or staying asleep, exhibiting a general  state of irritability).: No Description Entered (Status: improved). Motor tension (e.g., restlessness,  tiredness, shakiness, muscle tension).: No Description Entered (Status: improved).  Problems Addressed  Anxiety, Phase Of Life Problems, Anxiety  Goals 1. Learn and implement coping skills that result in a reduction of anxiety  and worry, and improved daily functioning. Objective Learn  and implement calming skills to reduce overall anxiety and manage anxiety symptoms. Target Date: 2025-08-09Frequency: Weekly Progress: 40 Modality: individual  Related Interventions 1. Teach the client calming/relaxation skills (e.g., applied relaxation, progressive muscle  relaxation, cue controlled relaxation; mindful breathing; biofeedback) and how to discriminate  better between relaxation and tension; teach the client how to apply these skills to his/her daily  life (e.g., New Directions in Progressive Muscle Relaxation by Marcelyn Ditty, and  Hazlett-Stevens; Treating Generalized Anxiety Disorder by Rygh and Ida Rogue). Objective Identify, challenge, and replace biased, fearful self-talk with positive, realistic, and empowering selftalk. Target Date: 2024-05-05 Frequency: weekly Progress: 30 Modality: individual Related Interventions 1. Explore the client's schema and self-talk that mediate his/her fear response; assist him/her in  challenging the biases; replace the distorted messages with reality-based alternatives and  positive, realistic self-talk that will increase his/her self-confidence in coping with irrational  fears (see Cognitive Therapy of Anxiety Disorders by Laurence Slate). Objective Learn and implement problem-solving strategies for realistically addressing worries. Target Date: 2025-08-09Frequency: weekly Progress: 40 Modality: individual 2. Resolve conflicted feelings and adapt to the new life circumstances. Objective Apply problem-solving skills to current circumstances. Target Date: 2024-05-05 Frequency: weekly Progress: 20 Modality: individual Related Interventions 1. Teach the client problem-resolution skills (e.g., defining the problem clearly, brainstorming  multiple solutions, listing the pros and cons of each solution, seeking input from others,  selecting and implementing a plan of action, evaluating outcome, and readjusting plan as   necessary).   3. Stabilize anxiety level while increasing ability to function on a daily  basis. 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  The patient approved this plan.   Deonna Krummel G Ethridge Sollenberger, LCSW

## 2024-09-25 ENCOUNTER — Ambulatory Visit: Admitting: Psychology

## 2024-09-25 DIAGNOSIS — F330 Major depressive disorder, recurrent, mild: Secondary | ICD-10-CM

## 2024-09-25 DIAGNOSIS — F411 Generalized anxiety disorder: Secondary | ICD-10-CM | POA: Diagnosis not present

## 2024-09-25 NOTE — Progress Notes (Addendum)
 " [facility] Counselor/Therapist Progress Note  Patient ID: Kristin Sparks, MRN: [id number],    Date: 09/25/2024  Time Spent:58 minutes  Time in:  9:05 Time out: 10:03  Treatment Type: Individual Therapy  Reported Symptoms: anxiety and depression  Mental Status Exam: Appearance:  Casual     Behavior: Appropriate  Motor: Normal  Speech/Language:  Normal Rate  Affect: Blunt  Mood: pleasant  Thought process: normal  Thought content:   WNL  Sensory/Perceptual disturbances:   WNL  Orientation: oriented to person, place, time/date, and situation  Attention: Good  Concentration: Good  Memory: WNL  Fund of knowledge:  Good  Insight:   Good  Judgment:  Good  Impulse Control: Good   Risk Assessment: Danger to Self:  No Self-injurious Behavior: No Danger to Others: No Duty to Warn:no Physical Aggression / Violence:No  Access to Firearms a concern: No  Gang Involvement:No   Subjective: The patient attended a face-to-face individual therapy session via video visit.  The patient gave verbal consent to have the session on video on caregility and the patient is aware of the limitations of telehealth. The patient was in her  home alone and the therapist was in the office.  The patient presents with a blunted affect and mood is pleasant.  The patient reports that she had a good Christmas and was down at her sister's house.  We talked a lot today about caring for herself as she ages and being able to navigate the system.  We talked about the importance of her making sure that she has her we will updated and her power of attorney situation updated.  In addition we talked about where she is going to need to be in order for that person that she is signs to be able to take care of her and the way she needs to be taking care of.  The patient had not really thought a lot about her we will or her power of attorney but we talked about the importance of making sure all of that is taking  care of and that she is starting to think about how she is going to get her needs met moving forward.  Interventions: Cognitive Behavioral Therapy and Assertiveness/Communication, problem solving  Diagnosis:Mild episode of recurrent major depressive disorder  Generalized anxiety disorder  Plan:  Client Abilities/Strengths  Intelligent, caring, motivated  Client Treatment Preferences  Outpatient individual therapy  Client Statement of Needs   I need some help to deal with my situation, I 'm not sure if I'm depressed or what?  Treatment Level  Outpatient Individual therapy  Symptoms  Frustration and anxiety related to providing oversight and caretaking to an aging, ailing, and dependent  parent.: No Description Entered (Status: improved). Lack of energy.: No Description Entered (Status:  improved). Motor tension (e.g., restlessness, tiredness, shakiness, muscle tension).: No Description  Entered (Status: improved). Poor concentration and indecisiveness.: No Description Entered (Status:  improved). Social withdrawal.: No Description Entered (Status: improved).  Problems Addressed  Unipolar Depression, Phase Of Life Problems, Unipolar Depression, Anxiety, Unipolar Depression,  Phase Of Life Problems, Anxiety  Goals 1. Appropriately grieve the loss in order to normalize mood and to return  to previously adaptive level of functioning. 2. Balance life activities between consideration of others and development of own interests. Objective Apply problem-solving skills to current circumstances Target Date: 2025-10-15 Frequency: Biweekly Progress: 90 Modality: individual Related Interventions 1. Use modeling and role-playing with the client to apply the problem-solving approach to  his/her  current circumstances (or assign Applying Problem-Solving to Interpersonal Conflict from  the Adult Psychotherapy Homework Planner by [facility]); encourage implementation of action  plan,  reinforcing success and redirecting for failure. Objective Implement new activities that increase a sense of satisfaction. Target Date: 2025-10-15 Frequency: Biweekly Progress: 90 Modality: individual Related Interventions 1. Develop a plan with the client to include activities that will increase his/her satisfaction, fulfill  his/her values, and improve the quality of his/her life. 3. Develop healthy interpersonal relationships that lead to the alleviation  and help prevent the relapse of depression. Objective Learn and implement problem-solving and decision-making skills. Target Date: 2025-10-15 Frequency: Biweekly Progress: 90 Modality: individual Related Interventions 1. Encourage in the client the development of a positive problem orientation in which problems  and solving them are viewed as a natural part of life and not something to be feared, despaired,  or avoided. 2. Conduct Problem-Solving Therapy  Implement new activities that increase a sense of satisfaction. Target Date: 2025-10-15 Frequency: Biweekly Progress: 90 Modality: individual Related Interventions 1. Develop a plan with the client to include activities that will increase his/her satisfaction, fulfill  his/her values, and improve the quality of his/her life. 3. Develop healthy interpersonal relationships that lead to the alleviation  and help prevent the relapse of depression. Objective Learn and implement problem-solving and decision-making skills. Target Date: 2025-10-15 Frequency: Biweekly Progress: 90 Modality: individual Related Interventions 1. Encourage in the client the development of a positive problem orientation in which problems  and solving them are viewed as a natural part of life and not something to be feared, despaired,  or avoided.  Objective Identify and replace thoughts and beliefs that support depression. Target Date: 2025-10-15 Frequency: Biweekly Progress: 90 Modality:  individual Related Interventions 1. Explore and restructure underlying assumptions and beliefs reflected in biased self-talk that  may put the client at risk for relapse or recurrence. 2. Facilitate and reinforce the client's shift from biased depressive self-talk and beliefs to realitybased cognitive messages that enhance self-confidence and increase adaptive actions (see  Positive Self-Talk in the Adult Psychotherapy Homework Planner by Jenniffer). 4. Enhance ability to effectively cope with the full variety of life's worries  and anxieties. Objective Identify, challenge, and replace biased, fearful self-talk with positive, realistic, and empowering selftalk. Target Date: 2025-10-15 Frequency: Biweekly Progress: 80 Modality: individual Related Interventions 1. Explore the client's schema and self-talk that mediate his/her fear response; assist him/her in  challenging the biases; replace the distorted messages with reality-based alternatives and  positive, realistic self-talk that will increase his/her self-confidence in coping with irrational  fears (see Cognitive Therapy of Anxiety Disorders by Gretta armin Mon). Objective Learn and implement problem-solving strategies for realistically addressing worries. Target Date: 2025-10-15 Frequency: Biweekly Progress: 60 Modality: individual Therapy 5. Recognize, accept, and cope with feelings of depression. 6. Resolve conflicted feelings and adapt to the new life circumstances. 7. Stabilize anxiety level while increasing ability to function on a daily  The patient is making progress and has approved this plan of treatment.  Jomo Forand G Tyrail Grandfield, LCSW                                                                                                     "

## 2024-10-09 ENCOUNTER — Ambulatory Visit: Admitting: Psychology

## 2024-10-09 DIAGNOSIS — F330 Major depressive disorder, recurrent, mild: Secondary | ICD-10-CM | POA: Diagnosis not present

## 2024-10-09 DIAGNOSIS — F411 Generalized anxiety disorder: Secondary | ICD-10-CM

## 2024-10-09 NOTE — Progress Notes (Signed)
 " [facility] Counselor/Therapist Progress Note  Patient ID: Kristin Sparks, MRN: [id number],    Date: 10/09/2024  Time Spent:60 minutes  Time in:  9:05 Time out: 10:05  Treatment Type: Individual Therapy  Reported Symptoms: anxiety and depression  Mental Status Exam: Appearance:  Casual     Behavior: Appropriate  Motor: Normal  Speech/Language:  Normal Rate  Affect: Blunt  Mood: pleasant  Thought process: normal  Thought content:   WNL  Sensory/Perceptual disturbances:   WNL  Orientation: oriented to person, place, time/date, and situation  Attention: Good  Concentration: Good  Memory: WNL  Fund of knowledge:  Good  Insight:   Good  Judgment:  Good  Impulse Control: Good   Risk Assessment: Danger to Self:  No Self-injurious Behavior: No Danger to Others: No Duty to Warn:no Physical Aggression / Violence:No  Access to Firearms a concern: No  Gang Involvement:No   Subjective: The patient attended a face-to-face individual therapy session via video visit.  The patient gave verbal consent to have the session on video on caregility and the patient is aware of the limitations of telehealth. The patient was in her  home alone and the therapist was in the office.  The patient presents with a blunted affect and mood is pleasant.  The patient states that she stayed at her sister's house for about 12 days.  She reports that she definitely feels better when she is around people and doing things.  She has returned home now and we talked today about the potential of her actually choosing to rent something instead of buying something at first when she moves.  We did some brainstorming about different things that she could do since she is still not sure if she wants to live in [place] or [place], [place] .  We talked about the possibility of her even renting in a retirement community for a while to see if she like that.  She thought these were good ideas that we  talked about today and we also talked about my retirement and whether she wanted to try to get a therapist moving forward.  She has been a little on the fence about whether she wants a therapist or whether she wants to graduate so we talked about the need to have a conversation a bit more about this so we can get releases signed if we need to refer her somewhere. Interventions: Cognitive Behavioral Therapy and Assertiveness/Communication, problem solving  Diagnosis:Mild episode of recurrent major depressive disorder  Generalized anxiety disorder  Plan:  Client Abilities/Strengths  Intelligent, caring, motivated  Client Treatment Preferences  Outpatient individual therapy  Client Statement of Needs   I need some help to deal with my situation, I 'm not sure if I'm depressed or what?  Treatment Level  Outpatient Individual therapy  Symptoms  Frustration and anxiety related to providing oversight and caretaking to an aging, ailing, and dependent  parent.: No Description Entered (Status: improved). Lack of energy.: No Description Entered (Status:  improved). Motor tension (e.g., restlessness, tiredness, shakiness, muscle tension).: No Description  Entered (Status: improved). Poor concentration and indecisiveness.: No Description Entered (Status:  improved). Social withdrawal.: No Description Entered (Status: improved).  Problems Addressed  Unipolar Depression, Phase Of Life Problems, Unipolar Depression, Anxiety, Unipolar Depression,  Phase Of Life Problems, Anxiety  Goals 1. Appropriately grieve the loss in order to normalize mood and to return  to previously adaptive level of functioning. 2. Balance life activities between consideration of others and  development of own interests. Objective Apply problem-solving skills to current circumstances Target Date: 2025-10-15 Frequency: Biweekly Progress: 90 Modality: individual Related Interventions 1. Use modeling and role-playing with  the client to apply the problem-solving approach to his/her  current circumstances (or assign Applying Problem-Solving to Interpersonal Conflict from  the Adult Psychotherapy Homework Planner by Jenniffer); encourage implementation of action  plan, reinforcing success and redirecting for failure. Objective Implement new activities that increase a sense of satisfaction. Target Date: 2025-10-15 Frequency: Biweekly Progress: 90 Modality: individual Related Interventions 1. Develop a plan with the client to include activities that will increase his/her satisfaction, fulfill  his/her values, and improve the quality of his/her life. 3. Develop healthy interpersonal relationships that lead to the alleviation  and help prevent the relapse of depression. Objective Learn and implement problem-solving and decision-making skills. Target Date: 2025-10-15 Frequency: Biweekly Progress: 90 Modality: individual Related Interventions 1. Encourage in the client the development of a positive problem orientation in which problems  and solving them are viewed as a natural part of life and not something to be feared, despaired,  or avoided. 2. Conduct Problem-Solving Therapy  Implement new activities that increase a sense of satisfaction. Target Date: 2025-10-15 Frequency: Biweekly Progress: 90 Modality: individual Related Interventions 1. Develop a plan with the client to include activities that will increase his/her satisfaction, fulfill  his/her values, and improve the quality of his/her life. 3. Develop healthy interpersonal relationships that lead to the alleviation  and help prevent the relapse of depression. Objective Learn and implement problem-solving and decision-making skills. Target Date: 2025-10-15 Frequency: Biweekly Progress: 90 Modality: individual Related Interventions 1. Encourage in the client the development of a positive problem orientation in which problems  and solving them are  viewed as a natural part of life and not something to be feared, despaired,  or avoided.  Objective Identify and replace thoughts and beliefs that support depression. Target Date: 2025-10-15 Frequency: Biweekly Progress: 90 Modality: individual Related Interventions 1. Explore and restructure underlying assumptions and beliefs reflected in biased self-talk that  may put the client at risk for relapse or recurrence. 2. Facilitate and reinforce the client's shift from biased depressive self-talk and beliefs to realitybased cognitive messages that enhance self-confidence and increase adaptive actions (see  Positive Self-Talk in the Adult Psychotherapy Homework Planner by Jenniffer). 4. Enhance ability to effectively cope with the full variety of life's worries  and anxieties. Objective Identify, challenge, and replace biased, fearful self-talk with positive, realistic, and empowering selftalk. Target Date: 2025-10-15 Frequency: Biweekly Progress: 80 Modality: individual Related Interventions 1. Explore the client's schema and self-talk that mediate his/her fear response; assist him/her in  challenging the biases; replace the distorted messages with reality-based alternatives and  positive, realistic self-talk that will increase his/her self-confidence in coping with irrational  fears (see Cognitive Therapy of Anxiety Disorders by Gretta armin Mon). Objective Learn and implement problem-solving strategies for realistically addressing worries. Target Date: 2025-10-15 Frequency: Biweekly Progress: 60 Modality: individual Therapy 5. Recognize, accept, and cope with feelings of depression. 6. Resolve conflicted feelings and adapt to the new life circumstances. 7. Stabilize anxiety level while increasing ability to function on a daily  The patient is making progress and has approved this plan of treatment.  Remy Voiles G Yug Loria,  LCSW                                                                                                     "

## 2024-10-23 ENCOUNTER — Ambulatory Visit: Admitting: Psychology

## 2024-10-23 DIAGNOSIS — F411 Generalized anxiety disorder: Secondary | ICD-10-CM

## 2024-10-23 NOTE — Progress Notes (Signed)
 "  [facility] Counselor/Therapist Progress Note  Patient ID: Kristin Sparks, MRN: [id number],    Date: 10/23/2024  Time Spent:58 minutes  Time in:  9:00 Time out: 9:58  Treatment Type: Individual Therapy  Reported Symptoms: anxiety and depression  Mental Status Exam: Appearance:  Casual     Behavior: Appropriate  Motor: Normal  Speech/Language:  Normal Rate  Affect: Blunt  Mood: pleasant  Thought process: normal  Thought content:   WNL  Sensory/Perceptual disturbances:   WNL  Orientation: oriented to person, place, time/date, and situation  Attention: Good  Concentration: Good  Memory: WNL  Fund of knowledge:  Good  Insight:   Good  Judgment:  Good  Impulse Control: Good   Risk Assessment: Danger to Self:  No Self-injurious Behavior: No Danger to Others: No Duty to Warn:no Physical Aggression / Violence:No  Access to Firearms a concern: No  Gang Involvement:No   Subjective: The patient attended a face-to-face individual therapy session via video visit.  The patient gave verbal consent to have the session on video on caregility and the patient is aware of the limitations of telehealth. The patient was in her  home alone and the therapist was in the office.  The patient presents with a blunted affect and mood is pleasant.  The patient reports that she has been snowed and for a few days and that seems to be okay and she has been handling things and then prepared.  The patient states that she feels like things are going okay and she is moving more toward making some sort of decision about her living situation.  We did some processing of some things that she could do and look into when she starts looking more in [place].  She has spoken with someone who lives at a continuing care facility and they seem to love it there.  We talked about the possibility of her just choosing to rent a place that she does not have to do a buy and fee for a while to see if she  likes it.  We talked about this previously but I think she is moving more in a direction of renting something instead of buying anything right away.  She continues to be on her medications and that seems to be going well at present.  I think she sees her medication provider in the near future and she talked about having a moment where she had some dizziness and had some difficulty with feeling like she was going to faint.  I recommended that she speak with her medication provider about this but also her doctor. Interventions: Cognitive Behavioral Therapy and Assertiveness/Communication, problem solving  Diagnosis:Generalized anxiety disorder  Plan:  Client Abilities/Strengths  Intelligent, caring, motivated  Client Treatment Preferences  Outpatient individual therapy  Client Statement of Needs   I need some help to deal with my situation, I 'm not sure if I'm depressed or what?  Treatment Level  Outpatient Individual therapy  Symptoms  Frustration and anxiety related to providing oversight and caretaking to an aging, ailing, and dependent  parent.: No Description Entered (Status: improved). Lack of energy.: No Description Entered (Status:  improved). Motor tension (e.g., restlessness, tiredness, shakiness, muscle tension).: No Description  Entered (Status: improved). Poor concentration and indecisiveness.: No Description Entered (Status:  improved). Social withdrawal.: No Description Entered (Status: improved).  Problems Addressed  Unipolar Depression, Phase Of Life Problems, Unipolar Depression, Anxiety, Unipolar Depression,  Phase Of Life Problems, Anxiety  Goals 1. Appropriately  grieve the loss in order to normalize mood and to return  to previously adaptive level of functioning. 2. Balance life activities between consideration of others and development of own interests. Objective Apply problem-solving skills to current circumstances Target Date: 2025-10-15 Frequency:  Biweekly Progress: 90 Modality: individual Related Interventions 1. Use modeling and role-playing with the client to apply the problem-solving approach to his/her  current circumstances (or assign Applying Problem-Solving to Interpersonal Conflict from  the Adult Psychotherapy Homework Planner by Jenniffer); encourage implementation of action  plan, reinforcing success and redirecting for failure. Objective Implement new activities that increase a sense of satisfaction. Target Date: 2025-10-15 Frequency: Biweekly Progress: 90 Modality: individual Related Interventions 1. Develop a plan with the client to include activities that will increase his/her satisfaction, fulfill  his/her values, and improve the quality of his/her life. 3. Develop healthy interpersonal relationships that lead to the alleviation  and help prevent the relapse of depression. Objective Learn and implement problem-solving and decision-making skills. Target Date: 2025-10-15 Frequency: Biweekly Progress: 90 Modality: individual Related Interventions 1. Encourage in the client the development of a positive problem orientation in which problems  and solving them are viewed as a natural part of life and not something to be feared, despaired,  or avoided. 2. Conduct Problem-Solving Therapy  Implement new activities that increase a sense of satisfaction. Target Date: 2025-10-15 Frequency: Biweekly Progress: 90 Modality: individual Related Interventions 1. Develop a plan with the client to include activities that will increase his/her satisfaction, fulfill  his/her values, and improve the quality of his/her life. 3. Develop healthy interpersonal relationships that lead to the alleviation  and help prevent the relapse of depression. Objective Learn and implement problem-solving and decision-making skills. Target Date: 2025-10-15 Frequency: Biweekly Progress: 90 Modality: individual Related Interventions 1. Encourage  in the client the development of a positive problem orientation in which problems  and solving them are viewed as a natural part of life and not something to be feared, despaired,  or avoided.  Objective Identify and replace thoughts and beliefs that support depression. Target Date: 2025-10-15 Frequency: Biweekly Progress: 90 Modality: individual Related Interventions 1. Explore and restructure underlying assumptions and beliefs reflected in biased self-talk that  may put the client at risk for relapse or recurrence. 2. Facilitate and reinforce the client's shift from biased depressive self-talk and beliefs to realitybased cognitive messages that enhance self-confidence and increase adaptive actions (see  Positive Self-Talk in the Adult Psychotherapy Homework Planner by Jenniffer). 4. Enhance ability to effectively cope with the full variety of life's worries  and anxieties. Objective Identify, challenge, and replace biased, fearful self-talk with positive, realistic, and empowering selftalk. Target Date: 2025-10-15 Frequency: Biweekly Progress: 80 Modality: individual Related Interventions 1. Explore the client's schema and self-talk that mediate his/her fear response; assist him/her in  challenging the biases; replace the distorted messages with reality-based alternatives and  positive, realistic self-talk that will increase his/her self-confidence in coping with irrational  fears (see Cognitive Therapy of Anxiety Disorders by Gretta armin Mon). Objective Learn and implement problem-solving strategies for realistically addressing worries. Target Date: 2025-10-15 Frequency: Biweekly Progress: 60 Modality: individual Therapy 5. Recognize, accept, and cope with feelings of depression. 6. Resolve conflicted feelings and adapt to the new life circumstances. 7. Stabilize anxiety level while increasing ability to function on a daily  The patient is making progress and has approved this plan  of treatment.  Kearston Putman G Dade Rodin, LCSW                                                                                                     "

## 2024-11-06 ENCOUNTER — Ambulatory Visit: Admitting: Psychology

## 2024-11-20 ENCOUNTER — Ambulatory Visit: Admitting: Psychology

## 2024-12-04 ENCOUNTER — Ambulatory Visit: Admitting: Psychology

## 2024-12-18 ENCOUNTER — Ambulatory Visit: Admitting: Psychology
